# Patient Record
Sex: Female | Born: 1975
Health system: Southern US, Community
[De-identification: ages and names within clinical notes are randomized; demographics above are authoritative.]

## PROBLEM LIST (undated history)

## (undated) DIAGNOSIS — G4730 Sleep apnea, unspecified: Secondary | ICD-10-CM

## (undated) DIAGNOSIS — F329 Major depressive disorder, single episode, unspecified: Secondary | ICD-10-CM

## (undated) DIAGNOSIS — F32A Depression, unspecified: Secondary | ICD-10-CM

## (undated) DIAGNOSIS — F419 Anxiety disorder, unspecified: Secondary | ICD-10-CM

## (undated) DIAGNOSIS — A6000 Herpesviral infection of urogenital system, unspecified: Secondary | ICD-10-CM

## (undated) HISTORY — DX: Herpesviral infection of urogenital system, unspecified: A60.00

## (undated) HISTORY — PX: LAPAROSCOPIC GASTRIC SLEEVE RESECTION: SHX5895

## (undated) HISTORY — PX: TONSILLECTOMY: SUR1361

## (undated) HISTORY — DX: Sleep apnea, unspecified: G47.30

## (undated) HISTORY — DX: Depression, unspecified: F32.A

## (undated) HISTORY — DX: Anxiety disorder, unspecified: F41.9

## (undated) HISTORY — DX: Major depressive disorder, single episode, unspecified: F32.9

---

## 2005-09-21 ENCOUNTER — Ambulatory Visit: Payer: Self-pay | Admitting: Family Medicine

## 2005-09-25 ENCOUNTER — Ambulatory Visit: Payer: Self-pay | Admitting: Family Medicine

## 2005-11-16 ENCOUNTER — Ambulatory Visit: Payer: Self-pay | Admitting: Family Medicine

## 2006-01-22 ENCOUNTER — Ambulatory Visit: Payer: Self-pay | Admitting: Family Medicine

## 2006-02-05 ENCOUNTER — Ambulatory Visit: Payer: Self-pay | Admitting: Family Medicine

## 2006-03-09 ENCOUNTER — Encounter (INDEPENDENT_AMBULATORY_CARE_PROVIDER_SITE_OTHER): Payer: Self-pay | Admitting: Internal Medicine

## 2006-03-09 ENCOUNTER — Other Ambulatory Visit: Admission: RE | Admit: 2006-03-09 | Discharge: 2006-03-09 | Payer: Self-pay | Admitting: Family Medicine

## 2006-03-09 ENCOUNTER — Ambulatory Visit: Payer: Self-pay | Admitting: Family Medicine

## 2006-03-09 LAB — CONVERTED CEMR LAB
Alkaline Phosphatase: 144 units/L — ABNORMAL HIGH (ref 39–117)
Basophils Absolute: 0.1 10*3/uL (ref 0.0–0.1)
CO2: 23 meq/L (ref 19–32)
Calcium: 8.9 mg/dL (ref 8.4–10.5)
Eosinophils Relative: 5 % (ref 0–5)
HCT: 45.4 % (ref 36.0–46.0)
Indirect Bilirubin: 0.3 mg/dL (ref 0.0–0.9)
Lymphocytes Relative: 24 % (ref 12–46)
Neutro Abs: 7.2 10*3/uL (ref 1.7–7.7)
Neutrophils Relative %: 64 % (ref 43–77)
Platelets: 327 10*3/uL (ref 150–400)
Potassium: 4.4 meq/L (ref 3.5–5.3)
RDW: 13.6 % (ref 11.5–14.0)
Sodium: 138 meq/L (ref 135–145)
Total Protein: 6.6 g/dL (ref 6.0–8.3)

## 2006-07-20 ENCOUNTER — Ambulatory Visit: Payer: Self-pay | Admitting: Family Medicine

## 2006-07-20 LAB — CONVERTED CEMR LAB
Bilirubin Urine: NEGATIVE
Glucose, Urine, Semiquant: NEGATIVE
Protein, U semiquant: NEGATIVE

## 2007-02-25 ENCOUNTER — Ambulatory Visit: Payer: Self-pay | Admitting: Family Medicine

## 2007-02-25 DIAGNOSIS — J069 Acute upper respiratory infection, unspecified: Secondary | ICD-10-CM | POA: Insufficient documentation

## 2007-02-25 LAB — CONVERTED CEMR LAB: Rapid Strep: NEGATIVE

## 2007-02-26 ENCOUNTER — Telehealth (INDEPENDENT_AMBULATORY_CARE_PROVIDER_SITE_OTHER): Payer: Self-pay | Admitting: Internal Medicine

## 2007-03-27 ENCOUNTER — Encounter (INDEPENDENT_AMBULATORY_CARE_PROVIDER_SITE_OTHER): Payer: Self-pay | Admitting: Internal Medicine

## 2007-03-27 ENCOUNTER — Ambulatory Visit: Payer: Self-pay | Admitting: Family Medicine

## 2007-03-27 ENCOUNTER — Other Ambulatory Visit: Admission: RE | Admit: 2007-03-27 | Discharge: 2007-03-27 | Payer: Self-pay | Admitting: Family Medicine

## 2007-03-27 DIAGNOSIS — R0989 Other specified symptoms and signs involving the circulatory and respiratory systems: Secondary | ICD-10-CM

## 2007-03-27 DIAGNOSIS — R0609 Other forms of dyspnea: Secondary | ICD-10-CM | POA: Insufficient documentation

## 2007-03-28 ENCOUNTER — Encounter (INDEPENDENT_AMBULATORY_CARE_PROVIDER_SITE_OTHER): Payer: Self-pay | Admitting: Internal Medicine

## 2007-04-01 LAB — CONVERTED CEMR LAB
BUN: 10 mg/dL (ref 6–23)
Basophils Relative: 1.1 % — ABNORMAL HIGH (ref 0.0–1.0)
CO2: 30 meq/L (ref 19–32)
Calcium: 9.1 mg/dL (ref 8.4–10.5)
Creatinine, Ser: 0.6 mg/dL (ref 0.4–1.2)
GFR calc Af Amer: 151 mL/min
Monocytes Relative: 2.4 % — ABNORMAL LOW (ref 3.0–11.0)
Platelets: 373 10*3/uL (ref 150–400)
RDW: 13.1 % (ref 11.5–14.6)

## 2007-04-08 ENCOUNTER — Encounter (INDEPENDENT_AMBULATORY_CARE_PROVIDER_SITE_OTHER): Payer: Self-pay | Admitting: Internal Medicine

## 2007-04-08 ENCOUNTER — Encounter (INDEPENDENT_AMBULATORY_CARE_PROVIDER_SITE_OTHER): Payer: Self-pay | Admitting: *Deleted

## 2007-06-04 ENCOUNTER — Telehealth (INDEPENDENT_AMBULATORY_CARE_PROVIDER_SITE_OTHER): Payer: Self-pay | Admitting: Internal Medicine

## 2007-06-24 ENCOUNTER — Telehealth (INDEPENDENT_AMBULATORY_CARE_PROVIDER_SITE_OTHER): Payer: Self-pay | Admitting: Internal Medicine

## 2007-07-08 ENCOUNTER — Telehealth (INDEPENDENT_AMBULATORY_CARE_PROVIDER_SITE_OTHER): Payer: Self-pay | Admitting: Internal Medicine

## 2007-09-11 ENCOUNTER — Telehealth (INDEPENDENT_AMBULATORY_CARE_PROVIDER_SITE_OTHER): Payer: Self-pay | Admitting: Internal Medicine

## 2007-10-21 ENCOUNTER — Telehealth: Payer: Self-pay | Admitting: Family Medicine

## 2007-10-21 ENCOUNTER — Telehealth (INDEPENDENT_AMBULATORY_CARE_PROVIDER_SITE_OTHER): Payer: Self-pay | Admitting: Internal Medicine

## 2007-10-31 ENCOUNTER — Ambulatory Visit: Payer: Self-pay | Admitting: Family Medicine

## 2007-10-31 DIAGNOSIS — F411 Generalized anxiety disorder: Secondary | ICD-10-CM | POA: Insufficient documentation

## 2008-01-17 ENCOUNTER — Telehealth (INDEPENDENT_AMBULATORY_CARE_PROVIDER_SITE_OTHER): Payer: Self-pay | Admitting: Internal Medicine

## 2008-02-04 ENCOUNTER — Ambulatory Visit: Payer: Self-pay | Admitting: Family Medicine

## 2008-02-04 DIAGNOSIS — B079 Viral wart, unspecified: Secondary | ICD-10-CM | POA: Insufficient documentation

## 2008-02-04 LAB — CONVERTED CEMR LAB: Rapid Strep: NEGATIVE

## 2008-03-10 ENCOUNTER — Telehealth (INDEPENDENT_AMBULATORY_CARE_PROVIDER_SITE_OTHER): Payer: Self-pay | Admitting: Internal Medicine

## 2008-03-10 ENCOUNTER — Ambulatory Visit: Payer: Self-pay | Admitting: Family Medicine

## 2008-03-10 DIAGNOSIS — F3289 Other specified depressive episodes: Secondary | ICD-10-CM | POA: Insufficient documentation

## 2008-03-10 DIAGNOSIS — F329 Major depressive disorder, single episode, unspecified: Secondary | ICD-10-CM | POA: Insufficient documentation

## 2008-04-14 ENCOUNTER — Other Ambulatory Visit: Admission: RE | Admit: 2008-04-14 | Discharge: 2008-04-14 | Payer: Self-pay | Admitting: Family Medicine

## 2008-04-14 ENCOUNTER — Encounter (INDEPENDENT_AMBULATORY_CARE_PROVIDER_SITE_OTHER): Payer: Self-pay | Admitting: Internal Medicine

## 2008-04-14 ENCOUNTER — Ambulatory Visit: Payer: Self-pay | Admitting: Family Medicine

## 2008-04-14 DIAGNOSIS — E669 Obesity, unspecified: Secondary | ICD-10-CM | POA: Insufficient documentation

## 2008-04-16 ENCOUNTER — Telehealth (INDEPENDENT_AMBULATORY_CARE_PROVIDER_SITE_OTHER): Payer: Self-pay | Admitting: Internal Medicine

## 2008-04-17 ENCOUNTER — Encounter (INDEPENDENT_AMBULATORY_CARE_PROVIDER_SITE_OTHER): Payer: Self-pay | Admitting: *Deleted

## 2008-04-28 ENCOUNTER — Ambulatory Visit: Payer: Self-pay | Admitting: Family Medicine

## 2008-04-29 LAB — CONVERTED CEMR LAB
AST: 32 units/L (ref 0–37)
Albumin: 3.4 g/dL — ABNORMAL LOW (ref 3.5–5.2)
BUN: 7 mg/dL (ref 6–23)
Basophils Absolute: 0.1 10*3/uL (ref 0.0–0.1)
CO2: 29 meq/L (ref 19–32)
Cholesterol: 187 mg/dL (ref 0–200)
Eosinophils Absolute: 0.4 10*3/uL (ref 0.0–0.7)
GFR calc non Af Amer: 123.1 mL/min (ref >60)
Glucose, Bld: 94 mg/dL (ref 70–99)
HDL: 31.7 mg/dL — ABNORMAL LOW (ref >39.00)
Hemoglobin: 13.1 g/dL (ref 12.0–15.0)
Lymphocytes Relative: 25.2 % (ref 12.0–46.0)
Monocytes Relative: 5.5 % (ref 3.0–12.0)
Neutro Abs: 4.5 10*3/uL (ref 1.4–7.7)
Neutrophils Relative %: 63.1 % (ref 43.0–77.0)
Platelets: 294 10*3/uL (ref 150.0–400.0)
Potassium: 3.8 meq/L (ref 3.5–5.1)
RDW: 13 % (ref 11.5–14.6)
TSH: 2.1 microintl units/mL (ref 0.35–5.50)
Total Protein: 6.2 g/dL (ref 6.0–8.3)
VLDL: 28.8 mg/dL (ref 0.0–40.0)

## 2008-06-11 ENCOUNTER — Ambulatory Visit: Payer: Self-pay | Admitting: Family Medicine

## 2008-06-12 ENCOUNTER — Telehealth (INDEPENDENT_AMBULATORY_CARE_PROVIDER_SITE_OTHER): Payer: Self-pay | Admitting: Internal Medicine

## 2008-06-29 ENCOUNTER — Telehealth (INDEPENDENT_AMBULATORY_CARE_PROVIDER_SITE_OTHER): Payer: Self-pay | Admitting: Internal Medicine

## 2008-09-21 ENCOUNTER — Telehealth (INDEPENDENT_AMBULATORY_CARE_PROVIDER_SITE_OTHER): Payer: Self-pay | Admitting: Internal Medicine

## 2008-10-22 ENCOUNTER — Telehealth (INDEPENDENT_AMBULATORY_CARE_PROVIDER_SITE_OTHER): Payer: Self-pay | Admitting: Internal Medicine

## 2008-11-02 ENCOUNTER — Telehealth: Payer: Self-pay | Admitting: Family Medicine

## 2008-11-02 DIAGNOSIS — B009 Herpesviral infection, unspecified: Secondary | ICD-10-CM | POA: Insufficient documentation

## 2008-11-13 ENCOUNTER — Ambulatory Visit: Payer: Self-pay | Admitting: Specialist

## 2009-04-16 ENCOUNTER — Telehealth: Payer: Self-pay | Admitting: Family Medicine

## 2009-06-29 ENCOUNTER — Emergency Department: Payer: Self-pay | Admitting: Emergency Medicine

## 2009-12-09 ENCOUNTER — Telehealth: Payer: Self-pay | Admitting: Family Medicine

## 2010-02-08 NOTE — Progress Notes (Signed)
Summary: refill request for valtrex  Phone Note Refill Request Message from:  Fax from Pharmacy  Refills Requested: Medication #1:  VALTREX 1 GM  TABS 2 tabs two times a day as directed as needed   Last Refilled: 07/11/1976 Faxed request from [facility] [address] Erin Mcdonald.  Initial call taken by: Lowella Petties CMA, AAMA,  December 09, 2009 9:56 AM  Follow-up for Phone Call        patient needs OV with MD here.  Last seen >1 year ago.  30 min appointment.  Follow-up by: Crawford Givens MD,  December 09, 2009 1:32 PM  Additional Follow-up for Phone Call Additional follow up Details #1::        Patient Advised.   She states she will make an appointment after the holidays. Additional Follow-up by: Delilah Shan CMA Adam Demary Dull),  December 09, 2009 3:25 PM    Prescriptions: VALTREX 1 GM  TABS (VALACYCLOVIR HCL) 2 tabs two times a day as directed as needed  #60 x 0   Entered and Authorized by:   Crawford Givens MD   Signed by:   Crawford Givens MD on 12/09/2009   Method used:   Electronically to        [facility]. [phone]* (retail)       [address]       [place], [place]  [address]       Ph: [phone]       Fax: [phone]   RxID:   [id number]

## 2010-02-08 NOTE — Progress Notes (Signed)
Summary: Acyclovir  Phone Note Refill Request Message from:  [employer] on April 16, 2009 12:06 PM  Refills Requested: Medication #1:  VALTREX 1 GM  TABS 2 tabs two times a day as directed as needed [facility] #[id number]*   Last Fill Date:  11/02/2008   Pharmacy Phone:  [phone]   Method Requested: Electronic Initial call taken by: Delilah Shan CMA (AAMA),  April 16, 2009 12:06 PM    Prescriptions: VALTREX 1 GM  TABS (VALACYCLOVIR HCL) 2 tabs two times a day as directed as needed  #60 x 0   Entered and Authorized by:   Ruthe Mannan MD   Signed by:   Ruthe Mannan MD on 04/16/2009   Method used:   Electronically to        [facility]. [phone]* (retail)       [address]       [place], [place]  [address]       Ph: [phone]       Fax: [phone]   RxID:   [id number]

## 2010-05-27 NOTE — Assessment & Plan Note (Signed)
[facility]                             [facility] OFFICE NOTE   NAME:Erin Mcdonald, Erin Mcdonald                            MRN:          [id number]  DATE:09/21/2005                            DOB:          Jul 10, 1975    CHIEF COMPLAINT:  A 35 year old white female here to establish new doctor.   HISTORY OF PRESENT ILLNESS:  Erin Mcdonald states that she used to see Dr. Quillian Quince  and is now transferring her care over to me.  She comes with the following  concerns:  1. Genital herpes, moderate control:  She has been switches over to      acyclovir 400 mg twice daily.  She states that she now has gotten back      on her insurance and would like to switch back to Valtrex 1000 mg      daily.  She states that this works much better for her than the      acyclovir and she is much better at remembering to take it once a day.  2. Panic attack, moderate control:  [name] states that she has been      having problems with panic attacks for the past few years.  She was      prescribed Xanax 0.25 mg to use as needed for panic attacks.  She      states that she only has about 1 every 2 months or so.  The panic      symptoms are intermittent for approximately 1 week and then go away.      She notes an increase in the panic attack feelings when she is under      more stress.  She feels anxious, sweaty, and feeling of doom when the      happen.  She has fear  that they will return.  She is currently seeing      a therapist, [name], for this issue.  She is requesting Xanax      refill at this time.  She has tried different antidepressants in the      past including Zoloft and possibly Effexor.  She states she did not      like the way they made her feel.  She is open to trying others in the      future.  3. Contraceptive counseling:  She states that she was previously on      Seasonale for several years and did well on this.  She more recently      tried Loestrin 24 for  about 3 weeks and felt as though she had an      increase in abdominal cramps and heavier periods.  She then stopped      this medication.  She would like to return to Seasonale.  4. Tobacco abuse:  She is currently contemplative about tobacco cessation.      She has heard about Chantix from one of her friends.  She is      considering trying this medication, but would like to talk about what  her friend experienced before she does.  She will let me know if she is      interested.  5. Obesity:  She has been gaining weight steadily over the past few years.      She has difficulty finding time for exercise.  She very often eats on      the run and has fast food for lunch.   PAST MEDICAL HISTORY:  1. Anxiety and panic attacks.  2. Genital herpes.  3. Tobacco abuse.  4. Hypercholesterolemia.   HOSPITALIZATIONS/SURGERIES/PROCEDURES:  1. Pap smear in January 2007.  2. Tetanus in 2005.   ALLERGIES:  None.   MEDICATIONS:  1. Acyclovir 400 mg one p.o. b.i.d.  2. Xanax 0.25 mg p.r.n. panic attacks.   FAMILY HISTORY:  Father alive at age 48, healthy, except that he is  undergoing some sort of intestinal surgery that she is unsure as to why.  She does not think that it is cancer-related.  Mother alive at age 28, had  an MI at age 33; she also has hypertension.  Nashika has 1 brother who has high  cholesterol and 1 half sister who is healthy.  There is no family history of  breast cancer, ovarian cancer, uterine cancer or colon cancer.  There is  some type of blood vessel cancer in her grandmother.   SOCIAL HISTORY:  She is a Scientist, forensic at [employer].  She is single and  is sexual activity.  She recently got out of a 4-year relationship.  She  uses condoms with intercourse.  She does not have any children.  She does  not get any regular exercise.  As far as her diet, she eats some fast foods  and does not get lots of water; she drinks a lot of soda though.  She smokes  about 15  cigarettes per day.  She uses a moderate amount of alcohol, mainly  in weekend binges with about 12 beers over the entire weekend.  No other  drug use.   PHYSICAL EXAM:  VITAL SIGNS:  Height 69 inches, weight 249, making BMI about  36.  Blood pressure 106/68, pulse 64, temperature 97.8.  GENERAL:  Obese-appearing female in no apparent distress.  HEENT:  Thick neck.  Small oropharyngeal cavity.  Nares clear.  Tympanic  membranes clear.  No thyromegaly.  No lymphadenopathy, cervical or  supraclavicular.  CARDIOVASCULAR:  Regular rate and rhythm.  No murmurs, rubs, or gallops.  PULMONARY:  Clear to auscultation bilaterally.  No wheezes, rales or  rhonchi.  ABDOMEN:  Soft, obese and nontender.  Normoactive bowel sounds.  No  hepatosplenomegaly.  MUSCULOSKELETAL:  Strength 5/5 in upper and lower extremities.  NEUROLOGIC:  Alert and oriented x3.  Cranial nerves II-XII grossly intact.  Reflexes 2+.  EXTREMITIES:  No peripheral edema, 2+ peripheral pulses, gait within normal  limits.   ASSESSMENT AND PLAN:  1. Genital herpes:  Will stop acyclovir and restart Valtrex.  She was      given a prescription for 1000 mg p.o. daily with 6 refills.  2. Panic attacks:  We discussed that Xanax is a temporary medication.  She      needs to prevent the panic attacks before they come.  I congratulated      her on her work with her therapist, but also encouraged her that we      will need to try a different type of antianxiety medication such as      BuSpar or an  selective serotonin reuptake inhibitor.  I did give her a      temporary prescription of Xanax 0.25 mg, #30, zero refills.  3. Contraceptive counseling:  We discussed different options and she chose      Seasonale.  She was given a prescription for this today.  4. Obesity:  I encouraged her to change soda over to water.  She was given      information on how to eat on the run healthfully, as well as other     nutritional information.  She also  needs to initiate an exercise      program and  was encouraged to do so.  5. Prevention:  Given her family history of coronary artery disease, we      will definitely need to follow up on her cholesterol panel.  She did      state that this was done in January 2007 and was slightly elevated.  I      will obtain records from her previous doctor.  We did discuss her      alcohol use and she was encouraged to try to decrease it.  We also      discussed tobacco      cessation and she will let me know if she would like to try a      medication to decrease cravings.                                   Kerby Nora, MD   AB/MedQ  DD:  09/21/2005  DT:  09/22/2005  Job #:  [id number]

## 2011-01-10 HISTORY — PX: TONSILLECTOMY: SUR1361

## 2011-03-01 ENCOUNTER — Ambulatory Visit: Payer: Self-pay | Admitting: Internal Medicine

## 2011-12-06 ENCOUNTER — Ambulatory Visit: Payer: Self-pay | Admitting: Otolaryngology

## 2011-12-08 LAB — PATHOLOGY REPORT

## 2012-08-15 DIAGNOSIS — Z79899 Other long term (current) drug therapy: Secondary | ICD-10-CM | POA: Insufficient documentation

## 2012-11-15 HISTORY — PX: LAPAROSCOPIC GASTRIC SLEEVE RESECTION: SHX5895

## 2013-02-28 ENCOUNTER — Emergency Department: Payer: Self-pay | Admitting: Emergency Medicine

## 2013-07-21 ENCOUNTER — Ambulatory Visit: Payer: Self-pay

## 2014-04-28 NOTE — Op Note (Signed)
PATIENT NAMESARANDA, Erin Mcdonald MR#:  [id number] DATE OF BIRTH:  1975/03/08  DATE OF PROCEDURE:  12/06/2011  PREOPERATIVE DIAGNOSES:  1. Chronic adenotonsillitis with adenotonsillar hyperplasia.   2. History of obstructive sleep apnea.   POSTOPERATIVE DIAGNOSES:  1. Chronic adenotonsillitis with adenotonsillar hyperplasia.   2. History of obstructive sleep apnea.   PROCEDURE: Adenotonsillectomy.   SURGEON: Osie Cheeks, MD   ANESTHESIA: General endotracheal.   INDICATIONS: The patient has a history of recurrent tonsillitis as well as a history of obstructive sleep apnea.   FINDINGS: Tonsils were 3+ in size. The adenoids were moderately large partially obstructing the nasopharynx.   COMPLICATIONS: None.   DESCRIPTION OF PROCEDURE: After obtaining informed consent, the patient was taken to the operating room and placed in the supine position. After induction of general endotracheal anesthesia, the patient was turned 90 degrees and the head draped with the eyes protected. A McIVOR retractor was used to open the mouth and a red rubber catheter to retract the palate. The palate was palpated and there was no evidence of submucous cleft. The adenoids were resected in the usual fashion with the adenotome. Bleeding was controlled with Afrin moistened packs followed by cauterization of the adenoid bed. The right tonsil was grasped with an Allis and resected from the tonsillar fossa in the usual fashion with Bovie. The left tonsil was resected in a similar fashion. Cautery was used to control minor bleeding. Each tonsillar fossa was then injected with 0.25% Marcaine with epinephrine 1:200,000. The nose and throat were irrigated and suctioned to remove any adenoid debris and blood clot. She was then returned to the anesthesiologist for awakening. She was awakened and taken to the recovery room in good condition postoperatively. Blood loss was approximately 75 mL.  ____________________________ Sammuel Hines.  Richardson Landry, MD psb:drc D: 12/06/2011 08:25:34 ET T: 12/06/2011 09:56:32 ET JOB#: [id number]  cc: Sammuel Hines. Richardson Landry, MD, <Dictator> Riley Nearing MD ELECTRONICALLY SIGNED 12/13/2011 8:55

## 2014-08-27 DIAGNOSIS — D509 Iron deficiency anemia, unspecified: Secondary | ICD-10-CM | POA: Insufficient documentation

## 2015-01-07 ENCOUNTER — Other Ambulatory Visit: Payer: Self-pay | Admitting: Unknown Physician Specialty

## 2015-01-07 DIAGNOSIS — N63 Unspecified lump in unspecified breast: Secondary | ICD-10-CM

## 2015-01-19 ENCOUNTER — Other Ambulatory Visit: Payer: Self-pay

## 2015-01-19 ENCOUNTER — Ambulatory Visit: Payer: Self-pay

## 2015-04-21 DIAGNOSIS — F17210 Nicotine dependence, cigarettes, uncomplicated: Secondary | ICD-10-CM | POA: Diagnosis not present

## 2015-04-21 DIAGNOSIS — J019 Acute sinusitis, unspecified: Secondary | ICD-10-CM | POA: Diagnosis not present

## 2015-04-21 DIAGNOSIS — B373 Candidiasis of vulva and vagina: Secondary | ICD-10-CM | POA: Diagnosis not present

## 2015-04-21 DIAGNOSIS — J300 Vasomotor rhinitis: Secondary | ICD-10-CM | POA: Diagnosis not present

## 2015-05-12 DIAGNOSIS — M25562 Pain in left knee: Secondary | ICD-10-CM | POA: Diagnosis not present

## 2015-05-26 DIAGNOSIS — B9689 Other specified bacterial agents as the cause of diseases classified elsewhere: Secondary | ICD-10-CM | POA: Diagnosis not present

## 2015-05-26 DIAGNOSIS — N760 Acute vaginitis: Secondary | ICD-10-CM | POA: Diagnosis not present

## 2015-05-26 DIAGNOSIS — R875 Abnormal microbiological findings in specimens from female genital organs: Secondary | ICD-10-CM | POA: Diagnosis not present

## 2015-05-26 DIAGNOSIS — D649 Anemia, unspecified: Secondary | ICD-10-CM | POA: Diagnosis not present

## 2015-05-26 DIAGNOSIS — Z124 Encounter for screening for malignant neoplasm of cervix: Secondary | ICD-10-CM | POA: Diagnosis not present

## 2015-05-26 DIAGNOSIS — Z113 Encounter for screening for infections with a predominantly sexual mode of transmission: Secondary | ICD-10-CM | POA: Diagnosis not present

## 2015-05-26 DIAGNOSIS — Z01419 Encounter for gynecological examination (general) (routine) without abnormal findings: Secondary | ICD-10-CM | POA: Diagnosis not present

## 2015-05-26 DIAGNOSIS — Z1151 Encounter for screening for human papillomavirus (HPV): Secondary | ICD-10-CM | POA: Diagnosis not present

## 2015-06-16 DIAGNOSIS — F17210 Nicotine dependence, cigarettes, uncomplicated: Secondary | ICD-10-CM | POA: Diagnosis not present

## 2015-06-16 DIAGNOSIS — N390 Urinary tract infection, site not specified: Secondary | ICD-10-CM | POA: Diagnosis not present

## 2015-06-16 DIAGNOSIS — B009 Herpesviral infection, unspecified: Secondary | ICD-10-CM | POA: Diagnosis not present

## 2015-06-16 DIAGNOSIS — B373 Candidiasis of vulva and vagina: Secondary | ICD-10-CM | POA: Diagnosis not present

## 2015-06-16 DIAGNOSIS — D510 Vitamin B12 deficiency anemia due to intrinsic factor deficiency: Secondary | ICD-10-CM | POA: Diagnosis not present

## 2015-11-11 DIAGNOSIS — N390 Urinary tract infection, site not specified: Secondary | ICD-10-CM | POA: Diagnosis not present

## 2015-11-19 DIAGNOSIS — D509 Iron deficiency anemia, unspecified: Secondary | ICD-10-CM | POA: Diagnosis not present

## 2015-11-19 DIAGNOSIS — Z713 Dietary counseling and surveillance: Secondary | ICD-10-CM | POA: Diagnosis not present

## 2015-11-19 DIAGNOSIS — F17210 Nicotine dependence, cigarettes, uncomplicated: Secondary | ICD-10-CM | POA: Diagnosis not present

## 2015-11-19 DIAGNOSIS — Z8669 Personal history of other diseases of the nervous system and sense organs: Secondary | ICD-10-CM | POA: Diagnosis not present

## 2015-11-19 DIAGNOSIS — F419 Anxiety disorder, unspecified: Secondary | ICD-10-CM | POA: Diagnosis not present

## 2015-11-19 DIAGNOSIS — Z9884 Bariatric surgery status: Secondary | ICD-10-CM | POA: Diagnosis not present

## 2015-12-01 DIAGNOSIS — K219 Gastro-esophageal reflux disease without esophagitis: Secondary | ICD-10-CM | POA: Diagnosis not present

## 2015-12-01 DIAGNOSIS — F411 Generalized anxiety disorder: Secondary | ICD-10-CM | POA: Diagnosis not present

## 2015-12-01 DIAGNOSIS — Z7689 Persons encountering health services in other specified circumstances: Secondary | ICD-10-CM | POA: Diagnosis not present

## 2016-02-04 DIAGNOSIS — F410 Panic disorder [episodic paroxysmal anxiety] without agoraphobia: Secondary | ICD-10-CM | POA: Diagnosis not present

## 2016-02-04 DIAGNOSIS — F411 Generalized anxiety disorder: Secondary | ICD-10-CM | POA: Diagnosis not present

## 2016-02-17 DIAGNOSIS — F329 Major depressive disorder, single episode, unspecified: Secondary | ICD-10-CM | POA: Diagnosis not present

## 2016-02-17 DIAGNOSIS — F419 Anxiety disorder, unspecified: Secondary | ICD-10-CM | POA: Diagnosis not present

## 2016-02-17 DIAGNOSIS — Z6841 Body Mass Index (BMI) 40.0 and over, adult: Secondary | ICD-10-CM | POA: Diagnosis not present

## 2016-03-03 DIAGNOSIS — F331 Major depressive disorder, recurrent, moderate: Secondary | ICD-10-CM | POA: Diagnosis not present

## 2016-03-03 DIAGNOSIS — K219 Gastro-esophageal reflux disease without esophagitis: Secondary | ICD-10-CM | POA: Diagnosis not present

## 2016-03-03 DIAGNOSIS — E669 Obesity, unspecified: Secondary | ICD-10-CM | POA: Diagnosis not present

## 2016-03-03 DIAGNOSIS — F411 Generalized anxiety disorder: Secondary | ICD-10-CM | POA: Diagnosis not present

## 2016-03-03 DIAGNOSIS — F17210 Nicotine dependence, cigarettes, uncomplicated: Secondary | ICD-10-CM | POA: Diagnosis not present

## 2016-03-17 ENCOUNTER — Other Ambulatory Visit: Payer: Self-pay | Admitting: Internal Medicine

## 2016-04-05 DIAGNOSIS — J300 Vasomotor rhinitis: Secondary | ICD-10-CM | POA: Diagnosis not present

## 2016-04-05 DIAGNOSIS — F411 Generalized anxiety disorder: Secondary | ICD-10-CM | POA: Diagnosis not present

## 2016-04-05 DIAGNOSIS — J019 Acute sinusitis, unspecified: Secondary | ICD-10-CM | POA: Diagnosis not present

## 2016-04-05 DIAGNOSIS — B373 Candidiasis of vulva and vagina: Secondary | ICD-10-CM | POA: Diagnosis not present

## 2016-04-06 ENCOUNTER — Encounter (INDEPENDENT_AMBULATORY_CARE_PROVIDER_SITE_OTHER): Payer: Self-pay

## 2016-04-06 ENCOUNTER — Ambulatory Visit (INDEPENDENT_AMBULATORY_CARE_PROVIDER_SITE_OTHER): Payer: BLUE CROSS/BLUE SHIELD | Admitting: Vascular Surgery

## 2016-04-06 ENCOUNTER — Encounter (INDEPENDENT_AMBULATORY_CARE_PROVIDER_SITE_OTHER): Payer: Self-pay | Admitting: Vascular Surgery

## 2016-04-06 VITALS — BP 127/71 | HR 75 | Resp 18 | Ht 71.0 in | Wt 282.0 lb

## 2016-04-06 DIAGNOSIS — R600 Localized edema: Secondary | ICD-10-CM

## 2016-04-06 DIAGNOSIS — I83813 Varicose veins of bilateral lower extremities with pain: Secondary | ICD-10-CM | POA: Diagnosis not present

## 2016-04-12 DIAGNOSIS — R600 Localized edema: Secondary | ICD-10-CM | POA: Insufficient documentation

## 2016-04-12 DIAGNOSIS — I83813 Varicose veins of bilateral lower extremities with pain: Secondary | ICD-10-CM | POA: Insufficient documentation

## 2016-04-12 NOTE — Progress Notes (Signed)
vas 

## 2016-04-12 NOTE — Progress Notes (Signed)
Subjective:    Patient ID: Erin Mcdonald, female    DOB: Jul 30, 1975, 41 y.o.   MRN: [id number] Chief Complaint  Patient presents with  . New Patient (Initial Visit)    leg pain   Presents as a new patient self referred for "right lower extremity painful varicose veins". The patient was seen "years ago" by Dr. Delana Meyer. The patient endorses a history of worsening right lower extremity pain along the prominent varicose veins located in her right lower extremity. The discomfort has progressed over the last few years. Pain is worse at night. The pain is also associated with lower extremity edema. The patient continues to wear medical grade one compression stockings on a daily basis, she engages in elevating her legs and remaining active with minimal relief in her right lower extremity discomfort. The patient has been taking OTC inflammatories with minimal relief in symptoms. She denies any trauma or surgery to her right lower extremity. The patient denies any DVT history. She denies any fever, nausea or vomiting.    Review of Systems  Constitutional: Negative.   HENT: Negative.   Eyes: Negative.   Respiratory: Negative.   Cardiovascular: Positive for leg swelling.       Right Lower Extremity Painful Varicose Veins  Gastrointestinal: Negative.   Endocrine: Negative.   Genitourinary: Negative.   Musculoskeletal: Negative.   Skin: Negative.   Allergic/Immunologic: Negative.   Neurological: Negative.   Hematological: Negative.   Psychiatric/Behavioral: Negative.       Objective:   Physical Exam  Constitutional: She is oriented to person, place, and time. She appears well-developed and well-nourished. No distress.  HENT:  Head: Normocephalic and atraumatic.  Eyes: Conjunctivae are normal. Pupils are equal, round, and reactive to light.  Neck: Normal range of motion.  Cardiovascular: Normal rate, regular rhythm, normal heart sounds and intact distal pulses.   Pulses:      Radial pulses are 2+  on the right side, and 2+ on the left side.       Dorsalis pedis pulses are 2+ on the right side, and 2+ on the left side.       Posterior tibial pulses are 2+ on the right side, and 2+ on the left side.  >1cm scattered varicose veins on right lower extremity. Moderate edema. No stasis dermatitis.  Pulmonary/Chest: Effort normal.  Musculoskeletal: Normal range of motion. She exhibits edema.  Neurological: She is alert and oriented to person, place, and time.  Skin: Skin is warm and dry. She is not diaphoretic.  Psychiatric: She has a normal mood and affect. Her behavior is normal. Judgment and thought content normal.  Vitals reviewed.  BP 127/71   Pulse 75   Resp 18   Ht 5\' 11"  (1.803 m)   Wt 282 lb (127.9 kg)   BMI 39.33 kg/m   History reviewed. No pertinent past medical history.  Social History   Social History  . Marital status: Single    Spouse name: N/A  . Number of children: N/A  . Years of education: N/A   Occupational History  . Not on file.   Social History Main Topics  . Smoking status: Current Every Day Smoker  . Smokeless tobacco: Never Used  . Alcohol use Yes     Comment: occasionally  . Drug use: No  . Sexual activity: Not on file   Other Topics Concern  . Not on file   Social History Narrative  . No narrative on file   Past Surgical  History:  Procedure Laterality Date  . TONSILLECTOMY     Family History  Problem Relation Age of Onset  . Heart attack Mother   . Diabetes Paternal Grandmother   . Diabetes Paternal Grandfather    Allergies  Allergen Reactions  . Codeine Other (See Comments)    Cause pt to be hyper      Assessment & Plan:  Presents as a new patient self referred for "right lower extremity painful varicose veins". The patient was seen "years ago" by Dr. Delana Meyer. The patient endorses a history of worsening right lower extremity pain along the prominent varicose veins located in her right lower extremity. The discomfort has  progressed over the last few years. Pain is worse at night. The pain is also associated with lower extremity edema. The patient continues to wear medical grade one compression stockings on a daily basis, she engages in elevating her legs and remaining active with minimal relief in her right lower extremity discomfort. The patient has been taking OTC inflammatories with minimal relief in symptoms. She denies any trauma or surgery to her right lower extremity. Thd patient denies any DVT history. She denies any fever, nausea or vomiting.   1. Varicose veins of bilateral lower extremities with pain - New Patient seems to be failing conservative therapy. Will bring patient back for right lower extremity venous reflux to rule out any venous disease contributing to the patients discomfort.  The patient was instructed to call the office in the interim if any worsening edema or ulcerations to the legs, feet or toes occurs. The patient expresses their understanding.  - VAS [place] LOWER EXTREMITY VENOUS REFLUX; Future  2. Bilateral lower extremity edema - New As above.   No current outpatient prescriptions on file prior to visit.   No current facility-administered medications on file prior to visit.     There are no Patient Instructions on file for this visit. No Follow-up on file.   Christyan Reger A Teirra Carapia, PA-C

## 2016-05-22 DIAGNOSIS — Z9884 Bariatric surgery status: Secondary | ICD-10-CM | POA: Diagnosis not present

## 2016-05-22 DIAGNOSIS — B009 Herpesviral infection, unspecified: Secondary | ICD-10-CM | POA: Diagnosis not present

## 2016-05-22 DIAGNOSIS — R351 Nocturia: Secondary | ICD-10-CM | POA: Diagnosis not present

## 2016-05-22 DIAGNOSIS — Z713 Dietary counseling and surveillance: Secondary | ICD-10-CM | POA: Diagnosis not present

## 2016-05-22 DIAGNOSIS — Z638 Other specified problems related to primary support group: Secondary | ICD-10-CM | POA: Diagnosis not present

## 2016-05-22 DIAGNOSIS — F419 Anxiety disorder, unspecified: Secondary | ICD-10-CM | POA: Diagnosis not present

## 2016-05-22 DIAGNOSIS — R7989 Other specified abnormal findings of blood chemistry: Secondary | ICD-10-CM | POA: Diagnosis not present

## 2016-05-22 DIAGNOSIS — R635 Abnormal weight gain: Secondary | ICD-10-CM | POA: Diagnosis not present

## 2016-05-22 DIAGNOSIS — F17210 Nicotine dependence, cigarettes, uncomplicated: Secondary | ICD-10-CM | POA: Diagnosis not present

## 2016-05-22 DIAGNOSIS — K219 Gastro-esophageal reflux disease without esophagitis: Secondary | ICD-10-CM | POA: Diagnosis not present

## 2016-05-22 DIAGNOSIS — G4733 Obstructive sleep apnea (adult) (pediatric): Secondary | ICD-10-CM | POA: Diagnosis not present

## 2016-05-22 DIAGNOSIS — Z6841 Body Mass Index (BMI) 40.0 and over, adult: Secondary | ICD-10-CM | POA: Diagnosis not present

## 2016-05-22 DIAGNOSIS — N926 Irregular menstruation, unspecified: Secondary | ICD-10-CM | POA: Diagnosis not present

## 2016-05-22 DIAGNOSIS — F329 Major depressive disorder, single episode, unspecified: Secondary | ICD-10-CM | POA: Diagnosis not present

## 2016-05-22 DIAGNOSIS — Z566 Other physical and mental strain related to work: Secondary | ICD-10-CM | POA: Diagnosis not present

## 2016-05-22 DIAGNOSIS — D509 Iron deficiency anemia, unspecified: Secondary | ICD-10-CM | POA: Diagnosis not present

## 2016-06-14 ENCOUNTER — Ambulatory Visit (INDEPENDENT_AMBULATORY_CARE_PROVIDER_SITE_OTHER): Payer: BLUE CROSS/BLUE SHIELD | Admitting: Vascular Surgery

## 2016-06-14 ENCOUNTER — Encounter (INDEPENDENT_AMBULATORY_CARE_PROVIDER_SITE_OTHER): Payer: Self-pay | Admitting: Vascular Surgery

## 2016-06-14 ENCOUNTER — Ambulatory Visit (INDEPENDENT_AMBULATORY_CARE_PROVIDER_SITE_OTHER): Payer: BLUE CROSS/BLUE SHIELD

## 2016-06-14 VITALS — BP 99/60 | HR 68 | Resp 17 | Wt 282.0 lb

## 2016-06-14 DIAGNOSIS — I872 Venous insufficiency (chronic) (peripheral): Secondary | ICD-10-CM | POA: Diagnosis not present

## 2016-06-14 DIAGNOSIS — I83813 Varicose veins of bilateral lower extremities with pain: Secondary | ICD-10-CM | POA: Diagnosis not present

## 2016-06-14 NOTE — Progress Notes (Signed)
Subjective:    Patient ID: Erin Mcdonald, female    DOB: 1975-04-05, 41 y.o.   MRN: [id number] Chief Complaint  Patient presents with  . Follow-up   Patient last seen on 04/06/2016. Patient presents to review vascular studies. Since our last visit the patient has been wearing medical grade 1 compression stockings, elevating her legs daily and remaining active. She's been doing this with minimal improvement in her right lower lower extremity symptoms. Today the patient underwent a right lower extremity venous reflux exam which was notable for venous incompetence in the right great and small saphenous veins as well as throughout the right deep venous system. Minimally occlusive chronic thrombus noted in the right knee and proximal/mid calf level small saphenous vein which is consistent with cannulation of a previous ablation site. No evidence of deep vein thrombosis in the right lower extremity. Patient endorses a history of venous treatment to the right lower extremity however she does not remember which kind. Seems like possible laser ablation to the right lower extremity. Denies any fever or nausea or vomiting.    Review of Systems  Constitutional: Negative.   HENT: Negative.   Eyes: Negative.   Respiratory: Negative.   Cardiovascular: Positive for leg swelling.       Right lower extremity pain  Gastrointestinal: Negative.   Endocrine: Negative.   Genitourinary: Negative.   Musculoskeletal: Negative.   Skin: Negative.   Allergic/Immunologic: Negative.   Neurological: Negative.   Hematological: Negative.   Psychiatric/Behavioral: Negative.       Objective:   Physical Exam  Constitutional: She is oriented to person, place, and time. She appears well-developed and well-nourished. No distress.  HENT:  Head: Normocephalic and atraumatic.  Eyes: Conjunctivae are normal. Pupils are equal, round, and reactive to light.  Neck: Normal range of motion.  Cardiovascular: Normal rate, regular  rhythm, normal heart sounds and intact distal pulses.   Pulses:      Radial pulses are 2+ on the right side, and 2+ on the left side.       Dorsalis pedis pulses are 2+ on the right side, and 2+ on the left side.       Posterior tibial pulses are 2+ on the right side, and 2+ on the left side.  Pulmonary/Chest: Effort normal.  Musculoskeletal: Normal range of motion. She exhibits edema (Moderate right lower extremity edema).  Neurological: She is alert and oriented to person, place, and time.  Skin: She is not diaphoretic.  >1 centimeter varicose veins noted along the right lower extremity  Psychiatric: She has a normal mood and affect. Her behavior is normal. Judgment and thought content normal.  Vitals reviewed.   BP 99/60   Pulse 68   Resp 17   Wt 282 lb (127.9 kg)   BMI 39.33 kg/m   No past medical history on file.  Social History   Social History  . Marital status: Single    Spouse name: N/A  . Number of children: N/A  . Years of education: N/A   Occupational History  . Not on file.   Social History Main Topics  . Smoking status: Current Every Day Smoker  . Smokeless tobacco: Never Used  . Alcohol use Yes     Comment: occasionally  . Drug use: No  . Sexual activity: Not on file   Other Topics Concern  . Not on file   Social History Narrative  . No narrative on file    Past Surgical History:  Procedure Laterality Date  . TONSILLECTOMY      Family History  Problem Relation Age of Onset  . Heart attack Mother   . Diabetes Paternal Grandmother   . Diabetes Paternal Grandfather     Allergies  Allergen Reactions  . Codeine Other (See Comments)    Cause pt to be hyper       Assessment & Plan:  Patient last seen on 04/06/2016. Patient presents to review vascular studies. Since our last visit the patient has been wearing medical grade 1 compression stockings, elevating her legs daily and remaining active. She's been doing this with minimal improvement  in her right lower lower extremity symptoms. Today the patient underwent a right lower extremity venous reflux exam which was notable for venous incompetence in the right great and small saphenous veins as well as throughout the right deep venous system. Minimally occlusive chronic thrombus noted in the right knee and proximal/mid calf level small saphenous vein which is consistent with cannulation of a previous ablation site. No evidence of deep vein thrombosis in the right lower extremity. Patient endorses a history of venous treatment to the right lower extremity however she does not remember which kind. Seems like possible laser ablation to the right lower extremity. Denies any fever or nausea or vomiting.   1. Varicose veins of bilateral lower extremities with pain - stable The patient was encouraged to wear graduated compression stockings (20-30 mmHg) on a daily basis. The patient was instructed to begin wearing the stockings first thing in the morning and removing them in the evening. The patient was instructed specifically not to sleep in the stockings. Prescription In addition, behavioral modification including elevation during the day will be initiated. Anti-inflammatories for pain. The patient is likely to benefit from endovenous laser ablation. I have discussed the risks and benefits of the procedure. The risks primarily include DVT, recanalization, bleeding, infection, and inability to gain access. The patient will follow up in 2 months to asses conservative management.  Information on chronic venous insufficiency and compression stockings was given to the patient. The patient was instructed to call the office in the interim if any worsening edema or ulcerations to the legs, feet or toes occurs. The patient expresses their understanding.  2. Chronic venous insufficiency - new As above   Current Outpatient Prescriptions on File Prior to Visit  Medication Sig Dispense Refill  .  ALPRAZolam (XANAX) 1 MG tablet   0  . escitalopram (LEXAPRO) 20 MG tablet Take by mouth.    Marland Kitchen omeprazole (PRILOSEC) 20 MG capsule Take by mouth 2 (two) times daily.  0   No current facility-administered medications on file prior to visit.     There are no Patient Instructions on file for this visit. No Follow-up on file.   Erin Hinckley A Malahki Gasaway, PA-C

## 2016-07-24 ENCOUNTER — Encounter (INDEPENDENT_AMBULATORY_CARE_PROVIDER_SITE_OTHER): Payer: Self-pay | Admitting: Vascular Surgery

## 2016-07-24 ENCOUNTER — Ambulatory Visit (INDEPENDENT_AMBULATORY_CARE_PROVIDER_SITE_OTHER): Payer: BLUE CROSS/BLUE SHIELD | Admitting: Vascular Surgery

## 2016-07-24 VITALS — BP 99/63 | HR 60 | Ht 71.0 in | Wt 287.0 lb

## 2016-07-24 DIAGNOSIS — I872 Venous insufficiency (chronic) (peripheral): Secondary | ICD-10-CM

## 2016-07-24 DIAGNOSIS — R600 Localized edema: Secondary | ICD-10-CM | POA: Diagnosis not present

## 2016-07-24 DIAGNOSIS — I83813 Varicose veins of bilateral lower extremities with pain: Secondary | ICD-10-CM

## 2016-07-24 NOTE — Progress Notes (Signed)
MRN : [id number]  Erin Mcdonald is a 41 y.o. (Apr 20, 1975) female who presents with chief complaint of  Chief Complaint  Patient presents with  . Follow-up    Discuss Laser  .  History of Present Illness: The patient returns for followup evaluation 3 months after the initial visit. The patient continues to have pain in the lower extremities with dependency. The pain is lessened with elevation. Graduated compression stockings, Class I (20-30 mmHg), have been worn but the stockings do not eliminate the leg pain. Over-the-counter analgesics do not improve the symptoms. The degree of discomfort continues to interfere with daily activities. The patient notes the pain in the legs is causing problems with daily exercise, at the workplace and even with household activities and maintenance such as standing in the kitchen preparing meals and doing dishes.   Venous ultrasound shows normal deep venous system, no evidence of acute or chronic DVT.  Superficial reflux is present in the right GSV and SSV Current Meds  Medication Sig  . ALPRAZolam (XANAX) 1 MG tablet   . escitalopram (LEXAPRO) 20 MG tablet Take by mouth.  Marland Kitchen omeprazole (PRILOSEC) 20 MG capsule Take by mouth 2 (two) times daily.    No past medical history on file.  Past Surgical History:  Procedure Laterality Date  . TONSILLECTOMY      Social History Social History  Substance Use Topics  . Smoking status: Current Every Day Smoker  . Smokeless tobacco: Never Used  . Alcohol use Yes     Comment: occasionally    Family History Family History  Problem Relation Age of Onset  . Heart attack Mother   . Diabetes Paternal Grandmother   . Diabetes Paternal Grandfather     Allergies  Allergen Reactions  . Codeine Other (See Comments)    Cause pt to be hyper     REVIEW OF SYSTEMS (Negative unless checked)  Constitutional: [] Weight loss  [] Fever  [] Chills Cardiac: [] Chest pain   [] Chest pressure   [] Palpitations   [] Shortness of  breath when laying flat   [] Shortness of breath with exertion. Vascular:  [] Pain in legs with walking   [x] Pain in legs at rest  [] History of DVT   [] Phlebitis   [] Swelling in legs   [x] Varicose veins   [] Non-healing ulcers Pulmonary:   [] Uses home oxygen   [] Productive cough   [] Hemoptysis   [] Wheeze  [] COPD   [] Asthma Neurologic:  [] Dizziness   [] Seizures   [] History of stroke   [] History of TIA  [] Aphasia   [] Vissual changes   [] Weakness or numbness in arm   [] Weakness or numbness in leg Musculoskeletal:   [] Joint swelling   [] Joint pain   [] Low back pain Hematologic:  [] Easy bruising  [] Easy bleeding   [] Hypercoagulable state   [] Anemic Gastrointestinal:  [] Diarrhea   [] Vomiting  [] Gastroesophageal reflux/heartburn   [] Difficulty swallowing. Genitourinary:  [] Chronic kidney disease   [] Difficult urination  [] Frequent urination   [] Blood in urine Skin:  [] Rashes   [] Ulcers  Psychological:  [] History of anxiety   []  History of major depression.  Physical Examination  Vitals:   07/24/16 1608  BP: 99/63  Pulse: 60  Weight: 287 lb (130.2 kg)  Height: 5\' 11"  (1.803 m)   Body mass index is 40.03 kg/m. Gen: WD/WN, NAD Head: [place]/AT, No temporalis wasting.  Ear/Nose/Throat: Hearing grossly intact, nares w/o erythema or drainage Eyes: PER, EOMI, sclera nonicteric.  Neck: Supple, no large masses.   Pulmonary:  Good air movement,  no audible wheezing bilaterally, no use of accessory muscles.  Cardiac: RRR, no JVD Vascular: Large varicosities present extensively greater than 10 mm right leg.  Mild venous stasis changes to the legs bilaterally.  2+ soft pitting edema Vessel Right Left  Radial Palpable Palpable  Ulnar Palpable Palpable  Brachial Palpable Palpable  Carotid Palpable Palpable  Femoral Palpable Palpable  Popliteal Palpable Palpable  PT Palpable Palpable  DP Palpable Palpable  Gastrointestinal: Non-distended. No guarding/no peritoneal signs.  Musculoskeletal: M/S 5/5 throughout.   No deformity or atrophy.  Neurologic: CN 2-12 intact. Symmetrical.  Speech is fluent. Motor exam as listed above. Psychiatric: Judgment intact, Mood & affect appropriate for pt's clinical situation. Dermatologic: No rashes or ulcers noted.  No changes consistent with cellulitis. Lymph : No lichenification or skin changes of chronic lymphedema.  CBC Lab Results  Component Value Date   WBC 7.2 04/28/2008   HGB 13.1 04/28/2008   HCT 38.8 04/28/2008   MCV 91.1 04/28/2008   PLT 294.0 04/28/2008    BMET    Component Value Date/Time   NA 139 04/28/2008 1100   K 3.8 04/28/2008 1100   CL 106 04/28/2008 1100   CO2 29 04/28/2008 1100   GLUCOSE 94 04/28/2008 1100   BUN 7 04/28/2008 1100   CREATININE 0.6 04/28/2008 1100   CALCIUM 8.7 04/28/2008 1100   GFRNONAA 123.10 04/28/2008 1100   GFRAA 151 03/27/2007 1526   CrCl cannot be calculated (Patient's most recent lab result is older than the maximum 21 days allowed.).  COAG No results found for: INR, PROTIME  Radiology No results found.  Assessment/Plan 1. Varicose veins of bilateral lower extremities with pain Recommend  I have reviewed my previous  discussion with the patient regarding  varicose veins and why they cause symptoms. Patient will continue  wearing graduated compression stockings class 1 on a daily basis, beginning first thing in the morning and removing them in the evening.    In addition, behavioral modification including elevation during the day was again discussed and this will continue.  The patient has utilized over the counter pain medications and has been exercising.  However, at this time conservative therapy has not alleviated the patient's symptoms of leg pain and swelling  Recommend: laser ablation of the right great and small saphenous veins to eliminate the symptoms of pain and swelling of the lower extremities caused by the severe superficial venous reflux disease.   2. Chronic venous  insufficiency No surgery or intervention at this point in time.    I have had a long discussion with the patient regarding venous insufficiency and why it  causes symptoms. I have discussed with the patient the chronic skin changes that accompany venous insufficiency and the long term sequela such as infection and ulceration.  Patient will begin wearing graduated compression stockings class 1 (20-30 mmHg) or compression wraps on a daily basis a prescription was given. The patient will put the stockings on first thing in the morning and removing them in the evening. The patient is instructed specifically not to sleep in the stockings.    In addition, behavioral modification including several periods of elevation of the lower extremities during the day will be continued. I have demonstrated that proper elevation is a position with the ankles at heart level.  The patient is instructed to begin routine exercise, especially walking on a daily basis  Following the review of the ultrasound the patient will follow up in 2-3 months to reassess the degree  of swelling and the control that graduated compression stockings or compression wraps  is offering.   The patient can be assessed for a Lymph Pump at that time  3. Bilateral lower extremity edema See 1&2    Hortencia Pilar, MD  07/24/2016 8:17 PM

## 2016-08-04 ENCOUNTER — Other Ambulatory Visit: Payer: Self-pay | Admitting: Nurse Practitioner

## 2016-08-04 DIAGNOSIS — F331 Major depressive disorder, recurrent, moderate: Secondary | ICD-10-CM | POA: Diagnosis not present

## 2016-08-04 DIAGNOSIS — Z1231 Encounter for screening mammogram for malignant neoplasm of breast: Secondary | ICD-10-CM

## 2016-08-04 DIAGNOSIS — L209 Atopic dermatitis, unspecified: Secondary | ICD-10-CM | POA: Diagnosis not present

## 2016-08-04 DIAGNOSIS — F411 Generalized anxiety disorder: Secondary | ICD-10-CM | POA: Diagnosis not present

## 2016-08-04 DIAGNOSIS — K219 Gastro-esophageal reflux disease without esophagitis: Secondary | ICD-10-CM | POA: Diagnosis not present

## 2016-08-04 DIAGNOSIS — F17210 Nicotine dependence, cigarettes, uncomplicated: Secondary | ICD-10-CM | POA: Diagnosis not present

## 2016-08-22 ENCOUNTER — Ambulatory Visit
Admission: RE | Admit: 2016-08-22 | Discharge: 2016-08-22 | Disposition: A | Discharge status: Home / Self Care | Payer: BLUE CROSS/BLUE SHIELD | Source: Ambulatory Visit | Attending: Nurse Practitioner | Admitting: Nurse Practitioner

## 2016-08-22 ENCOUNTER — Encounter: Payer: Self-pay | Admitting: Radiology

## 2016-08-22 DIAGNOSIS — R928 Other abnormal and inconclusive findings on diagnostic imaging of breast: Secondary | ICD-10-CM | POA: Diagnosis not present

## 2016-08-22 DIAGNOSIS — Z1231 Encounter for screening mammogram for malignant neoplasm of breast: Secondary | ICD-10-CM

## 2016-08-24 ENCOUNTER — Other Ambulatory Visit: Payer: Self-pay | Admitting: Nurse Practitioner

## 2016-08-24 DIAGNOSIS — N6489 Other specified disorders of breast: Secondary | ICD-10-CM

## 2016-08-24 DIAGNOSIS — R928 Other abnormal and inconclusive findings on diagnostic imaging of breast: Secondary | ICD-10-CM

## 2016-09-05 ENCOUNTER — Ambulatory Visit
Admission: RE | Admit: 2016-09-05 | Discharge: 2016-09-05 | Disposition: A | Discharge status: Home / Self Care | Payer: BLUE CROSS/BLUE SHIELD | Source: Ambulatory Visit | Attending: Nurse Practitioner | Admitting: Nurse Practitioner

## 2016-09-05 DIAGNOSIS — R928 Other abnormal and inconclusive findings on diagnostic imaging of breast: Secondary | ICD-10-CM

## 2016-09-05 DIAGNOSIS — N6002 Solitary cyst of left breast: Secondary | ICD-10-CM | POA: Insufficient documentation

## 2016-09-05 DIAGNOSIS — N6489 Other specified disorders of breast: Secondary | ICD-10-CM

## 2016-09-05 DIAGNOSIS — R922 Inconclusive mammogram: Secondary | ICD-10-CM | POA: Insufficient documentation

## 2016-09-05 DIAGNOSIS — N6001 Solitary cyst of right breast: Secondary | ICD-10-CM | POA: Diagnosis not present

## 2016-10-19 ENCOUNTER — Other Ambulatory Visit (INDEPENDENT_AMBULATORY_CARE_PROVIDER_SITE_OTHER): Payer: BLUE CROSS/BLUE SHIELD | Admitting: Vascular Surgery

## 2016-10-23 ENCOUNTER — Encounter (INDEPENDENT_AMBULATORY_CARE_PROVIDER_SITE_OTHER): Payer: BLUE CROSS/BLUE SHIELD

## 2016-11-02 ENCOUNTER — Encounter (INDEPENDENT_AMBULATORY_CARE_PROVIDER_SITE_OTHER): Payer: Self-pay | Admitting: Vascular Surgery

## 2016-11-02 ENCOUNTER — Ambulatory Visit (INDEPENDENT_AMBULATORY_CARE_PROVIDER_SITE_OTHER): Payer: BLUE CROSS/BLUE SHIELD | Admitting: Vascular Surgery

## 2016-11-02 VITALS — BP 103/68 | HR 71 | Resp 16 | Wt 296.0 lb

## 2016-11-02 DIAGNOSIS — F411 Generalized anxiety disorder: Secondary | ICD-10-CM | POA: Diagnosis not present

## 2016-11-02 DIAGNOSIS — I83811 Varicose veins of right lower extremities with pain: Secondary | ICD-10-CM

## 2016-11-02 DIAGNOSIS — I83813 Varicose veins of bilateral lower extremities with pain: Secondary | ICD-10-CM

## 2016-11-02 DIAGNOSIS — F331 Major depressive disorder, recurrent, moderate: Secondary | ICD-10-CM | POA: Diagnosis not present

## 2016-11-02 DIAGNOSIS — K219 Gastro-esophageal reflux disease without esophagitis: Secondary | ICD-10-CM | POA: Diagnosis not present

## 2016-11-02 DIAGNOSIS — F17210 Nicotine dependence, cigarettes, uncomplicated: Secondary | ICD-10-CM | POA: Diagnosis not present

## 2016-11-02 DIAGNOSIS — Z23 Encounter for immunization: Secondary | ICD-10-CM | POA: Diagnosis not present

## 2016-11-02 DIAGNOSIS — E669 Obesity, unspecified: Secondary | ICD-10-CM | POA: Diagnosis not present

## 2016-11-05 NOTE — Progress Notes (Signed)
    MRN : [id number]  Erin Mcdonald is a 41 y.o. (September 21, 1975) female who presents with chief complaint of  Chief Complaint  Patient presents with  . Venous Insufficiency    Right Leg GSV laser ablation  .    The patient's right lower extremity was sterilely prepped and draped.  The ultrasound machine was used to visualize the right saphenous vein throughout its course.  A segment at the level of the knee was selected for access.  The saphenous vein was accessed without difficulty using ultrasound guidance with a micropuncture needle.   An 0.018  wire was placed beyond the saphenofemoral junction through the sheath and the microneedle was removed.  The 65 cm sheath was then placed over the wire and the wire and dilator were removed.  The laser fiber was placed through the sheath and its tip was placed approximately 2 cm below the saphenofemoral junction.  Tumescent anesthesia was then created with a dilute lidocaine solution.  Laser energy was then delivered with constant withdrawal of the sheath and laser fiber.  Approximately 1296 joules of energy were delivered over a length of 36 cm.  Sterile dressings were placed.  The patient tolerated the procedure well without complications.

## 2016-11-06 ENCOUNTER — Ambulatory Visit (INDEPENDENT_AMBULATORY_CARE_PROVIDER_SITE_OTHER): Payer: BLUE CROSS/BLUE SHIELD

## 2016-11-06 ENCOUNTER — Other Ambulatory Visit (INDEPENDENT_AMBULATORY_CARE_PROVIDER_SITE_OTHER): Payer: Self-pay | Admitting: Vascular Surgery

## 2016-11-06 DIAGNOSIS — Z9889 Other specified postprocedural states: Secondary | ICD-10-CM

## 2016-11-20 ENCOUNTER — Ambulatory Visit (INDEPENDENT_AMBULATORY_CARE_PROVIDER_SITE_OTHER): Payer: BLUE CROSS/BLUE SHIELD | Admitting: Vascular Surgery

## 2016-11-20 ENCOUNTER — Encounter (INDEPENDENT_AMBULATORY_CARE_PROVIDER_SITE_OTHER): Payer: Self-pay | Admitting: Vascular Surgery

## 2016-11-20 VITALS — BP 100/58 | HR 68 | Resp 17 | Wt 295.0 lb

## 2016-11-20 DIAGNOSIS — I872 Venous insufficiency (chronic) (peripheral): Secondary | ICD-10-CM

## 2016-11-20 DIAGNOSIS — I83813 Varicose veins of bilateral lower extremities with pain: Secondary | ICD-10-CM

## 2016-11-20 DIAGNOSIS — R600 Localized edema: Secondary | ICD-10-CM

## 2016-11-26 ENCOUNTER — Encounter (INDEPENDENT_AMBULATORY_CARE_PROVIDER_SITE_OTHER): Payer: Self-pay | Admitting: Vascular Surgery

## 2016-11-26 NOTE — Progress Notes (Signed)
MRN : [id number]  Erin Mcdonald is a 41 y.o. ([birth year]) female who presents with chief complaint of  Chief Complaint  Patient presents with  . Follow-up  .  History of Present Illness:The patient returns to the office for followup status post laser ablation of the right great saphenous vein.  The patient note significant improvement in the lower extremity pain but not resolution of the symptoms. The patient notes multiple residual varicosities bilaterally which continued to hurt with dependent positions and remained tender to palpation. The patient's swelling is minimally from preoperative status. The patient continues to wear graduated compression stockings on a daily basis but these are not eliminating the pain and discomfort. The patient continues to use over-the-counter anti-inflammatory medications to treat the pain and related symptoms but this has not given the patient relief. The patient notes the pain in the lower extremities is causing problems with daily exercise, problems at work and even with household activities such as preparing meals and doing dishes.  The patient is otherwise done well and there have been no complications related to the laser procedure or interval changes in the patient's overall   Post laser ultrasound shows successful ablation of the right great saphenous vein   Current Meds  Medication Sig  . ALPRAZolam (XANAX) 1 MG tablet   . escitalopram (LEXAPRO) 20 MG tablet Take by mouth.  Marland Kitchen omeprazole (PRILOSEC) 20 MG capsule Take by mouth 2 (two) times daily.    History reviewed. No pertinent past medical history.  Past Surgical History:  Procedure Laterality Date  . TONSILLECTOMY      Social History Social History   Tobacco Use  . Smoking status: Current Every Day Smoker  . Smokeless tobacco: Never Used  Substance Use Topics  . Alcohol use: Yes    Comment: occasionally  . Drug use: No    Family History Family History  Problem Relation Age of  Onset  . Heart attack Mother   . Diabetes Paternal Grandmother   . Diabetes Paternal Grandfather     Allergies  Allergen Reactions  . Codeine Other (See Comments)    Cause pt to be hyper     REVIEW OF SYSTEMS (Negative unless checked)  Constitutional: [] Weight loss  [] Fever  [] Chills Cardiac: [] Chest pain   [] Chest pressure   [] Palpitations   [] Shortness of breath when laying flat   [] Shortness of breath with exertion. Vascular:  [] Pain in legs with walking   [x] Pain in legs with standing  [] History of DVT   [] Phlebitis   [x] Swelling in legs   [x] Varicose veins   [] Non-healing ulcers Pulmonary:   [] Uses home oxygen   [] Productive cough   [] Hemoptysis   [] Wheeze  [] COPD   [] Asthma Neurologic:  [] Dizziness   [] Seizures   [] History of stroke   [] History of TIA  [] Aphasia   [] Vissual changes   [] Weakness or numbness in arm   [] Weakness or numbness in leg Musculoskeletal:   [] Joint swelling   [] Joint pain   [] Low back pain Hematologic:  [] Easy bruising  [] Easy bleeding   [] Hypercoagulable state   [] Anemic Gastrointestinal:  [] Diarrhea   [] Vomiting  [] Gastroesophageal reflux/heartburn   [] Difficulty swallowing. Genitourinary:  [] Chronic kidney disease   [] Difficult urination  [] Frequent urination   [] Blood in urine Skin:  [] Rashes   [] Ulcers  Psychological:  [] History of anxiety   []  History of major depression.  Physical Examination  Vitals:   11/20/16 1452  BP: (!) 100/58  Pulse: 68  Resp: 17  Weight: 295 lb (133.8 kg)   Body mass index is 41.14 kg/m. Gen: WD/WN, NAD Head: [place]/AT, No temporalis wasting.  Ear/Nose/Throat: Hearing grossly intact, nares w/o erythema or drainage Eyes: PER, EOMI, sclera nonicteric.  Neck: Supple, no large masses.   Pulmonary:  Good air movement, no audible wheezing bilaterally, no use of accessory muscles.  Cardiac: RRR, no JVD Vascular:Large varicosities present extensively greater than 10 mm bilaterally.  Mild venous stasis changes to the legs  bilaterally.  2+ soft pitting edema Vessel Right Left  Radial Palpable Palpable  PT Palpable Palpable  DP Palpable Palpable  Gastrointestinal: Non-distended. No guarding/no peritoneal signs.  Musculoskeletal: M/S 5/5 throughout.  No deformity or atrophy.  Neurologic: CN 2-12 intact. Symmetrical.  Speech is fluent. Motor exam as listed above. Psychiatric: Judgment intact, Mood & affect appropriate for pt's clinical situation. Dermatologic: venous rashes no ulcers noted.  No changes consistent with cellulitis. Lymph : No lichenification or skin changes of chronic lymphedema.  CBC Lab Results  Component Value Date   WBC 7.2 04/28/2008   HGB 13.1 04/28/2008   HCT 38.8 04/28/2008   MCV 91.1 04/28/2008   PLT 294.0 04/28/2008    BMET    Component Value Date/Time   NA 139 04/28/2008 1100   K 3.8 04/28/2008 1100   CL 106 04/28/2008 1100   CO2 29 04/28/2008 1100   GLUCOSE 94 04/28/2008 1100   BUN 7 04/28/2008 1100   CREATININE 0.6 04/28/2008 1100   CALCIUM 8.7 04/28/2008 1100   GFRNONAA 123.10 04/28/2008 1100   GFRAA 151 03/27/2007 1526   CrCl cannot be calculated (Patient's most recent lab result is older than the maximum 21 days allowed.).  COAG No results found for: INR, PROTIME  Radiology No results found.  Assessment/Plan 1. Varicose veins of bilateral lower extremities with pain Recommend:  The patient has had successful ablation of the previously incompetent saphenous venous system but still has persistent symptoms of pain and swelling that are having a negative impact on daily life and daily activities.  Patient should undergo injection sclerotherapy to treat the residual varicosities.  The risks, benefits and alternative therapies were reviewed in detail with the patient.  All questions were answered.  The patient agrees to proceed with sclerotherapy at their convenience.  The patient will continue wearing the graduated compression stockings and using the  over-the-counter pain medications to treat her symptoms.       2. Chronic venous insufficiency No surgery or intervention at this point in time.    I have had a long discussion with the patient regarding venous insufficiency and why it  causes symptoms. I have discussed with the patient the chronic skin changes that accompany venous insufficiency and the long term sequela such as infection and ulceration.  Patient will begin wearing graduated compression stockings class 1 (20-30 mmHg) or compression wraps on a daily basis a prescription was given. The patient will put the stockings on first thing in the morning and removing them in the evening. The patient is instructed specifically not to sleep in the stockings.    In addition, behavioral modification including several periods of elevation of the lower extremities during the day will be continued. I have demonstrated that proper elevation is a position with the ankles at heart level.  The patient is instructed to begin routine exercise, especially walking on a daily basis  Following the review of the ultrasound the patient will follow up in 2-3 months to reassess the degree of swelling  and the control that graduated compression stockings or compression wraps  is offering.   The patient can be assessed for a Lymph Pump at that time  3. Bilateral lower extremity edema See #1&2    Hortencia Pilar, MD  11/26/2016 3:01 PM

## 2016-12-25 ENCOUNTER — Ambulatory Visit (INDEPENDENT_AMBULATORY_CARE_PROVIDER_SITE_OTHER): Payer: BLUE CROSS/BLUE SHIELD | Admitting: Vascular Surgery

## 2017-01-03 ENCOUNTER — Ambulatory Visit: Payer: Self-pay | Admitting: Nurse Practitioner

## 2017-01-03 ENCOUNTER — Other Ambulatory Visit: Payer: Self-pay | Admitting: Nurse Practitioner

## 2017-01-03 DIAGNOSIS — J101 Influenza due to other identified influenza virus with other respiratory manifestations: Secondary | ICD-10-CM

## 2017-01-03 DIAGNOSIS — J069 Acute upper respiratory infection, unspecified: Secondary | ICD-10-CM

## 2017-01-03 MED ORDER — AZITHROMYCIN 250 MG PO TABS: ORAL_TABLET | ORAL | 0 refills | Status: DC

## 2017-01-03 MED ORDER — OSELTAMIVIR PHOSPHATE 75 MG PO CAPS
75.0000 mg | ORAL_CAPSULE | Freq: Two times a day (BID) | ORAL | 0 refills | Status: DC
Start: 2017-01-03 — End: 2017-05-17

## 2017-01-03 NOTE — Progress Notes (Signed)
Sent in prescriptions for zithromax 250mg , taking two tablets tdaoy and 1 tablet for follow ing nine days. Also added tamiflu 75mg  twice daily for 5 days.

## 2017-01-10 ENCOUNTER — Telehealth: Payer: Self-pay

## 2017-01-10 ENCOUNTER — Other Ambulatory Visit: Payer: Self-pay | Admitting: Nurse Practitioner

## 2017-01-10 DIAGNOSIS — H60503 Unspecified acute noninfective otitis externa, bilateral: Secondary | ICD-10-CM

## 2017-01-10 DIAGNOSIS — J069 Acute upper respiratory infection, unspecified: Secondary | ICD-10-CM

## 2017-01-10 MED ORDER — NEOMYCIN-POLYMYXIN-HC 3.5-10000-1 OT SOLN: 4.0000 [drp] | Freq: Three times a day (TID) | OTIC | 0 refills | Status: DC

## 2017-01-10 MED ORDER — AZITHROMYCIN 250 MG PO TABS: ORAL_TABLET | ORAL | 0 refills | Status: DC

## 2017-01-10 NOTE — Progress Notes (Signed)
Will have her repeat 10 day course of zithromax. Added neomycin ear drops TID for 7 days. Both sent to [facility] [address].

## 2017-01-10 NOTE — Telephone Encounter (Signed)
Pt advised we send antibiotic  

## 2017-01-18 ENCOUNTER — Ambulatory Visit: Payer: BLUE CROSS/BLUE SHIELD | Admitting: Nurse Practitioner

## 2017-01-18 ENCOUNTER — Encounter: Payer: Self-pay | Admitting: Nurse Practitioner

## 2017-01-18 VITALS — BP 120/78 | HR 96 | Temp 98.1°F | Resp 16 | Ht 71.0 in | Wt 302.0 lb

## 2017-01-18 DIAGNOSIS — B009 Herpesviral infection, unspecified: Secondary | ICD-10-CM

## 2017-01-18 DIAGNOSIS — J069 Acute upper respiratory infection, unspecified: Secondary | ICD-10-CM | POA: Diagnosis not present

## 2017-01-18 DIAGNOSIS — L209 Atopic dermatitis, unspecified: Secondary | ICD-10-CM

## 2017-01-18 DIAGNOSIS — F331 Major depressive disorder, recurrent, moderate: Secondary | ICD-10-CM | POA: Insufficient documentation

## 2017-01-18 DIAGNOSIS — K219 Gastro-esophageal reflux disease without esophagitis: Secondary | ICD-10-CM

## 2017-01-18 DIAGNOSIS — F411 Generalized anxiety disorder: Secondary | ICD-10-CM | POA: Diagnosis not present

## 2017-01-18 DIAGNOSIS — F321 Major depressive disorder, single episode, moderate: Secondary | ICD-10-CM

## 2017-01-18 DIAGNOSIS — H60312 Diffuse otitis externa, left ear: Secondary | ICD-10-CM

## 2017-01-18 MED ORDER — CIPROFLOXACIN-HYDROCORTISONE 0.2-1 % OT SUSP: 3.0000 [drp] | Freq: Two times a day (BID) | OTIC | 1 refills | Status: DC

## 2017-01-18 MED ORDER — AZITHROMYCIN 250 MG PO TABS: ORAL_TABLET | ORAL | 0 refills | Status: DC

## 2017-01-18 MED ORDER — VALACYCLOVIR HCL 1 G PO TABS: 1000.0000 mg | ORAL_TABLET | Freq: Every day | ORAL | 3 refills | Status: DC

## 2017-01-18 MED ORDER — OMEPRAZOLE 20 MG PO CPDR: 20.0000 mg | DELAYED_RELEASE_CAPSULE | Freq: Two times a day (BID) | ORAL | 3 refills | Status: DC

## 2017-01-18 MED ORDER — TRIAMCINOLONE ACETONIDE 0.1 % EX CREA: 1.0000 "application " | TOPICAL_CREAM | Freq: Two times a day (BID) | CUTANEOUS | 1 refills | Status: DC

## 2017-01-18 MED ORDER — ALPRAZOLAM 1 MG PO TABS: ORAL_TABLET | ORAL | 3 refills | Status: DC

## 2017-01-18 MED ORDER — ESCITALOPRAM OXALATE 20 MG PO TABS: ORAL_TABLET | ORAL | 3 refills | Status: DC

## 2017-01-18 NOTE — Progress Notes (Signed)
[facility] [place], [place] [address]  Internal MEDICINE  Office Visit Note  Patient Name: Erin Mcdonald  [id number]  [id number]  Date of Service: 01/18/2017     Complaints/HPI Pt is here for routine follow up.  The patient is here for routine follow up. Recently treated with tamiflu and z-pack for the flu over christmas. Followed this with ear drops, but left ear is still very congested. States that she can hardly hear out of this ear. She is otherwise, feeling much better .    Current Medication: Outpatient Encounter Medications as of 01/18/2017  Medication Sig  . ALPRAZolam (XANAX) 1 MG tablet   . ergocalciferol (VITAMIN D2) 50000 units capsule Take 50,000 Units by mouth once a week.  . escitalopram (LEXAPRO) 20 MG tablet Take by mouth.  Marland Kitchen omeprazole (PRILOSEC) 20 MG capsule Take by mouth 2 (two) times daily.  . valACYclovir (VALTREX) 1000 MG tablet Take 1,000 mg by mouth daily.  Marland Kitchen azithromycin (ZITHROMAX) 250 MG tablet Take 2 tablets po today. Take 1 tablet po QD starting tomorrow through next 9 days. (Patient not taking: Reported on 01/18/2017)  . neomycin-polymyxin-hydrocortisone (CORTISPORIN) OTIC solution Place 4 drops into both ears 3 (three) times daily. (Patient not taking: Reported on 01/18/2017)  . oseltamivir (TAMIFLU) 75 MG capsule Take 1 capsule (75 mg total) by mouth 2 (two) times daily. (Patient not taking: Reported on 01/18/2017)   No facility-administered encounter medications on file as of 01/18/2017.     Surgical History: Past Surgical History:  Procedure Laterality Date  . LAPAROSCOPIC GASTRIC SLEEVE RESECTION    . TONSILLECTOMY      Medical History: Past Medical History:  Diagnosis Date  . Anxiety   . Depression   . Sleep apnea     Family History: Family History  Problem Relation Age of Onset  . Heart attack Mother   . Diabetes Paternal Grandmother   . Diabetes Paternal Grandfather     Social History   Socioeconomic  History  . Marital status: Single    Spouse name: Not on file  . Number of children: Not on file  . Years of education: Not on file  . Highest education level: Not on file  Social Needs  . Financial resource strain: Not on file  . Food insecurity - worry: Not on file  . Food insecurity - inability: Not on file  . Transportation needs - medical: Not on file  . Transportation needs - non-medical: Not on file  Occupational History  . Not on file  Tobacco Use  . Smoking status: Current Every Day Smoker  . Smokeless tobacco: Never Used  Substance and Sexual Activity  . Alcohol use: Yes    Comment: occasionally  . Drug use: No  . Sexual activity: Not on file  Other Topics Concern  . Not on file  Social History Narrative  . Not on file      Review of Systems  Constitutional: Positive for chills.  HENT: Positive for congestion, ear pain, postnasal drip, rhinorrhea, sinus pain, sore throat and voice change.   Respiratory: Positive for cough and wheezing.   Cardiovascular: Negative.   Gastrointestinal: Negative.   Endocrine: Negative.   Musculoskeletal: Negative.   Skin: Negative.   Allergic/Immunologic: Negative.   Neurological: Positive for headaches.  Psychiatric/Behavioral: Negative.     Today's Vitals   01/18/17 0905  BP: 120/78  Pulse: 96  Resp: 16  Temp: 98.1 F (36.7 C)  SpO2: 98%  Weight: Marland Kitchen)  302 lb (137 kg)  Height: 5\' 11"  (1.803 m)    Physical Exam  Constitutional: She is oriented to person, place, and time. She appears well-developed and well-nourished.  HENT:  Head: Normocephalic and atraumatic.  Right Ear: Tympanic membrane and ear canal normal.  Left Ear: There is tenderness. Tympanic membrane is erythematous and bulging.  Nose: Rhinorrhea present. Left sinus exhibits maxillary sinus tenderness.  Mouth/Throat: Uvula is midline, oropharynx is clear and moist and mucous membranes are normal.  Eyes: Pupils are equal, round, and reactive to light.   Neck: Normal range of motion. Neck supple.  Cardiovascular: Normal rate, regular rhythm and normal heart sounds.  Pulmonary/Chest: Effort normal and breath sounds normal. She has no wheezes.  Abdominal: Soft. Bowel sounds are normal. There is no tenderness.  Musculoskeletal: Normal range of motion.  Neurological: She is alert and oriented to person, place, and time.  Skin: Skin is warm and dry.  Psychiatric: She has a normal mood and affect.  Nursing note and vitals reviewed.   Assessment/Plan:     ICD-10-CM   1. Acute diffuse otitis externa of left ear H60.312 ciprofloxacin-hydrocortisone (CIPRO HC) OTIC suspension  2. Acute upper respiratory infection J06.9 azithromycin (ZITHROMAX) 250 MG tablet  3. Atopic dermatitis, unspecified type L20.9 triamcinolone cream (KENALOG) 0.1 %  4. Herpes simplex disease B00.9 valACYclovir (VALTREX) 1000 MG tablet  5. Gastroesophageal reflux disease without esophagitis K21.9 omeprazole (PRILOSEC) 20 MG capsule  6. Depression, major, single episode, moderate (HCC) F32.1 escitalopram (LEXAPRO) 20 MG tablet  7. GAD (generalized anxiety disorder) F41.1 ALPRAZolam (XANAX) 1 MG tablet   1. Will do round of cipro HC ear drops for left ear - inserting 3 drops into the ear twice daily. Refer to ENT if no improvement over the next week 2. Repeat treatment with Zpack. Take as directed for 5 days  3. Add triamcinolone 0.1$ cream. Apply to affected areas BID prn  4. Continue acyclovir daily for preventive therapy.  5. Continue omeprazole 20mg  BID 6. lexapro 20mg  - take 1.5tablets daily. New rx provided today 7.. May continue alprazolam 1mg  twice daily. May also take 1/2 tablet at bedtime if needed. A new prescription was sent to her pharmacy.   General Counseling: I have discussed the findings of the evaluation and examination with Erin Mcdonald.  I have also discussed any further diagnostic evaluation that may be needed or ordered today. Erin Mcdonald verbalizes understanding of  the findings of todays visit. We also reviewed her medications today. she has been encouraged to call the office with any questions or concerns that should arise related to todays visit.  This patient was seen by Leretha Pol, FNP- C in Collaboration with Dr Lavera Guise as a part of collaborative care agreement    Time spent:20 minutes    Dr Lavera Guise Internal medicine

## 2017-01-22 ENCOUNTER — Ambulatory Visit (INDEPENDENT_AMBULATORY_CARE_PROVIDER_SITE_OTHER): Payer: BLUE CROSS/BLUE SHIELD | Admitting: Vascular Surgery

## 2017-01-29 ENCOUNTER — Encounter (INDEPENDENT_AMBULATORY_CARE_PROVIDER_SITE_OTHER): Payer: Self-pay | Admitting: Vascular Surgery

## 2017-01-29 ENCOUNTER — Ambulatory Visit (INDEPENDENT_AMBULATORY_CARE_PROVIDER_SITE_OTHER): Payer: BLUE CROSS/BLUE SHIELD | Admitting: Vascular Surgery

## 2017-01-29 VITALS — BP 135/78 | HR 85 | Resp 17 | Ht 71.0 in | Wt 299.0 lb

## 2017-01-29 DIAGNOSIS — I83813 Varicose veins of bilateral lower extremities with pain: Secondary | ICD-10-CM | POA: Diagnosis not present

## 2017-01-29 NOTE — Addendum Note (Signed)
Addended by: Marcelle Overlie A on: 01/29/2017 04:29 PM   Modules accepted: Level of Service

## 2017-01-29 NOTE — Progress Notes (Signed)
Varicose veins of right  lower extremity with inflammation (454.1  I83.10) Current Plans   Indication: Patient presents with symptomatic varicose veins of the right  lower extremity.   Procedure: Sclerotherapy using hypertonic saline mixed with 1% Lidocaine was performed on the right lower extremity. Compression wraps were placed. The patient tolerated the procedure well. 

## 2017-02-12 ENCOUNTER — Encounter (INDEPENDENT_AMBULATORY_CARE_PROVIDER_SITE_OTHER): Payer: Self-pay | Admitting: Vascular Surgery

## 2017-02-12 ENCOUNTER — Ambulatory Visit (INDEPENDENT_AMBULATORY_CARE_PROVIDER_SITE_OTHER): Payer: BLUE CROSS/BLUE SHIELD | Admitting: Vascular Surgery

## 2017-02-12 VITALS — BP 124/72 | HR 74 | Resp 18 | Wt 295.0 lb

## 2017-02-12 DIAGNOSIS — I83813 Varicose veins of bilateral lower extremities with pain: Secondary | ICD-10-CM | POA: Diagnosis not present

## 2017-02-12 NOTE — Progress Notes (Signed)
Varicose veins of right  lower extremity with inflammation (454.1  I83.10) Current Plans   Indication: Patient presents with symptomatic varicose veins of the right  lower extremity.   Procedure: Sclerotherapy using hypertonic saline mixed with 1% Lidocaine was performed on the right lower extremity. Compression wraps were placed. The patient tolerated the procedure well. 

## 2017-02-21 ENCOUNTER — Encounter: Payer: Self-pay | Admitting: Obstetrics and Gynecology

## 2017-02-21 ENCOUNTER — Ambulatory Visit: Payer: BLUE CROSS/BLUE SHIELD | Admitting: Obstetrics and Gynecology

## 2017-02-21 VITALS — BP 128/88 | Ht 69.0 in | Wt 270.0 lb

## 2017-02-21 DIAGNOSIS — B373 Candidiasis of vulva and vagina: Secondary | ICD-10-CM | POA: Diagnosis not present

## 2017-02-21 DIAGNOSIS — Z113 Encounter for screening for infections with a predominantly sexual mode of transmission: Secondary | ICD-10-CM | POA: Diagnosis not present

## 2017-02-21 DIAGNOSIS — B3731 Acute candidiasis of vulva and vagina: Secondary | ICD-10-CM

## 2017-02-21 LAB — POCT WET PREP WITH KOH
Clue Cells Wet Prep HPF POC: NEGATIVE
KOH Prep POC: NEGATIVE
TRICHOMONAS UA: NEGATIVE
Yeast Wet Prep HPF POC: NEGATIVE

## 2017-02-21 MED ORDER — FLUCONAZOLE 150 MG PO TABS: 150.0000 mg | ORAL_TABLET | Freq: Once | ORAL | 0 refills | Status: AC

## 2017-02-21 NOTE — Progress Notes (Signed)
Chief Complaint  Patient presents with  . Vaginitis    HPI:      Ms. Erin Mcdonald is a 42 y.o. G0P0000 who LMP was Patient's last menstrual period was 01/17/2017., presents today for vaginal itching, increased d/c, no odor for 3 days. Took leftover diflucan yesterday but no relief yet. Was on abx a few wks ago with flu. Is sex active, new partner. Wants STD testing. No fevers, LBP, belly pain, urin sx.  Past due for annual.    Past Medical History:  Diagnosis Date  . Anxiety   . Depression   . Sleep apnea     Past Surgical History:  Procedure Laterality Date  . LAPAROSCOPIC GASTRIC SLEEVE RESECTION    . TONSILLECTOMY      Family History  Problem Relation Age of Onset  . Heart attack Mother   . Diabetes Paternal Grandmother   . Diabetes Paternal Grandfather     Social History   Socioeconomic History  . Marital status: Single    Spouse name: Not on file  . Number of children: Not on file  . Years of education: Not on file  . Highest education level: Not on file  Social Needs  . Financial resource strain: Not on file  . Food insecurity - worry: Not on file  . Food insecurity - inability: Not on file  . Transportation needs - medical: Not on file  . Transportation needs - non-medical: Not on file  Occupational History  . Not on file  Tobacco Use  . Smoking status: Current Every Day Smoker  . Smokeless tobacco: Never Used  Substance and Sexual Activity  . Alcohol use: Yes    Comment: occasionally  . Drug use: No  . Sexual activity: Yes    Birth control/protection: None  Other Topics Concern  . Not on file  Social History Narrative  . Not on file     Current Outpatient Medications:  .  ALPRAZolam (XANAX) 1 MG tablet, Take 1 tablet po Qam and Qpm. May take additional 1/2 tablet QHS prn, Disp: 75 tablet, Rfl: 3 .  azithromycin (ZITHROMAX) 250 MG tablet, Take 2 tablets po today. Take 1 tablet po QD starting tomorrow through next 9 days., Disp: 11 tablet, Rfl:  0 .  ciprofloxacin-hydrocortisone (CIPRO HC) OTIC suspension, Place 3 drops into the left ear 2 (two) times daily. (Patient not taking: Reported on 02/12/2017), Disp: 10 mL, Rfl: 1 .  ergocalciferol (VITAMIN D2) 50000 units capsule, Take 50,000 Units by mouth once a week., Disp: , Rfl:  .  escitalopram (LEXAPRO) 20 MG tablet, Take 1.5tablets po QD, Disp: 45 tablet, Rfl: 3 .  fluconazole (DIFLUCAN) 150 MG tablet, Take 1 tablet (150 mg total) by mouth once for 1 dose., Disp: 1 tablet, Rfl: 0 .  neomycin-polymyxin-hydrocortisone (CORTISPORIN) OTIC solution, Place 4 drops into both ears 3 (three) times daily., Disp: 10 mL, Rfl: 0 .  omeprazole (PRILOSEC) 20 MG capsule, Take 1 capsule (20 mg total) by mouth 2 (two) times daily., Disp: 60 capsule, Rfl: 3 .  oseltamivir (TAMIFLU) 75 MG capsule, Take 1 capsule (75 mg total) by mouth 2 (two) times daily., Disp: 10 capsule, Rfl: 0 .  triamcinolone cream (KENALOG) 0.1 %, Apply 1 application topically 2 (two) times daily., Disp: 60 g, Rfl: 1 .  valACYclovir (VALTREX) 1000 MG tablet, Take 1 tablet (1,000 mg total) by mouth daily., Disp: 30 tablet, Rfl: 3   ROS:  Review of Systems  Constitutional: Negative for fever.  Gastrointestinal: Negative for blood in stool, constipation, diarrhea, nausea and vomiting.  Genitourinary: Positive for vaginal discharge. Negative for dyspareunia, dysuria, flank pain, frequency, hematuria, urgency, vaginal bleeding and vaginal pain.  Musculoskeletal: Negative for back pain.  Skin: Negative for rash.     OBJECTIVE:   Vitals:  BP 128/88   Ht 5\' 9"  (1.753 m)   Wt 270 lb (122.5 kg)   LMP 01/17/2017   BMI 39.87 kg/m   Physical Exam  Constitutional: She is oriented to person, place, and time and well-developed, well-nourished, and in no distress. Vital signs are normal.  Genitourinary: Uterus normal, cervix normal, right adnexa normal and left adnexa normal. Uterus is not enlarged. Cervix exhibits no motion tenderness  and no tenderness. Right adnexum displays no mass and no tenderness. Left adnexum displays no mass and no tenderness. Vulva exhibits erythema and exudate. Vulva exhibits no lesion, no rash and no tenderness. Vagina exhibits no lesion. Thick  odorless  white and vaginal discharge found.  Neurological: She is oriented to person, place, and time.  Vitals reviewed.   Results: Results for orders placed or performed in visit on 02/21/17 (from the past 24 hour(s))  POCT Wet Prep with KOH     Status: Normal   Collection Time: 02/21/17  4:46 PM  Result Value Ref Range   Trichomonas, UA Negative    Clue Cells Wet Prep HPF POC neg    Epithelial Wet Prep HPF POC  Few, Moderate, Many, Too numerous to count   Yeast Wet Prep HPF POC neg    Bacteria Wet Prep HPF POC  Few   RBC Wet Prep HPF POC     WBC Wet Prep HPF POC     KOH Prep POC Negative Negative     Assessment/Plan: Candidal vaginitis - Neg wet prep (took diflucan yesterday)/pos exam. Rx RF diflucan prn. F/u prn.  - Plan: fluconazole (DIFLUCAN) 150 MG tablet, POCT Wet Prep with KOH  Screening for STD (sexually transmitted disease) - Will call pt with results.  - Plan: Chlamydia/Gonococcus/Trichomonas, NAA    Meds ordered this encounter  Medications  . fluconazole (DIFLUCAN) 150 MG tablet    Sig: Take 1 tablet (150 mg total) by mouth once for 1 dose.    Dispense:  1 tablet    Refill:  0    Order Specific Question:   Supervising Provider    Answer:   Gae Dry [[id number]]      Return in about 3 weeks (around 0/04/5407) for annual.  Gerrad Welker B. Deamber Buckhalter, PA-C 02/21/2017 4:47 PM

## 2017-02-21 NOTE — Patient Instructions (Signed)
I value your feedback and entrusting us with your care. If you get a [facility] patient survey, I would appreciate you taking the time to let us know about your experience today. Thank you! 

## 2017-02-22 ENCOUNTER — Ambulatory Visit (INDEPENDENT_AMBULATORY_CARE_PROVIDER_SITE_OTHER): Payer: BLUE CROSS/BLUE SHIELD | Admitting: Vascular Surgery

## 2017-02-22 ENCOUNTER — Encounter (INDEPENDENT_AMBULATORY_CARE_PROVIDER_SITE_OTHER): Payer: Self-pay | Admitting: Vascular Surgery

## 2017-02-22 VITALS — BP 114/72 | HR 76 | Resp 18 | Ht 71.0 in | Wt 296.0 lb

## 2017-02-22 DIAGNOSIS — I83813 Varicose veins of bilateral lower extremities with pain: Secondary | ICD-10-CM | POA: Diagnosis not present

## 2017-02-22 NOTE — Progress Notes (Signed)
Varicose veins of right  lower extremity with inflammation (454.1  I83.10) Current Plans   Indication: Patient presents with symptomatic varicose veins of the right  lower extremity.   Procedure: Sclerotherapy using hypertonic saline mixed with 1% Lidocaine was performed on the right lower extremity. Compression wraps were placed. The patient tolerated the procedure well. 

## 2017-02-25 LAB — CHLAMYDIA/GONOCOCCUS/TRICHOMONAS, NAA
CHLAMYDIA BY NAA: NEGATIVE
Gonococcus by NAA: NEGATIVE
TRICH VAG BY NAA: NEGATIVE

## 2017-03-22 ENCOUNTER — Other Ambulatory Visit: Payer: Self-pay

## 2017-03-22 MED ORDER — ERGOCALCIFEROL 1.25 MG (50000 UT) PO CAPS: 50000.0000 [IU] | ORAL_CAPSULE | ORAL | 1 refills | Status: DC

## 2017-05-17 ENCOUNTER — Ambulatory Visit: Payer: BLUE CROSS/BLUE SHIELD | Admitting: Nurse Practitioner

## 2017-05-17 ENCOUNTER — Encounter: Payer: Self-pay | Admitting: Nurse Practitioner

## 2017-05-17 VITALS — BP 118/70 | HR 55 | Resp 16 | Ht 69.0 in | Wt 296.0 lb

## 2017-05-17 DIAGNOSIS — Z6841 Body Mass Index (BMI) 40.0 and over, adult: Secondary | ICD-10-CM

## 2017-05-17 DIAGNOSIS — L209 Atopic dermatitis, unspecified: Secondary | ICD-10-CM

## 2017-05-17 DIAGNOSIS — F411 Generalized anxiety disorder: Secondary | ICD-10-CM | POA: Diagnosis not present

## 2017-05-17 DIAGNOSIS — K219 Gastro-esophageal reflux disease without esophagitis: Secondary | ICD-10-CM | POA: Diagnosis not present

## 2017-05-17 DIAGNOSIS — F321 Major depressive disorder, single episode, moderate: Secondary | ICD-10-CM

## 2017-05-17 DIAGNOSIS — B009 Herpesviral infection, unspecified: Secondary | ICD-10-CM

## 2017-05-17 MED ORDER — PHENTERMINE HCL 37.5 MG PO TABS: 37.5000 mg | ORAL_TABLET | Freq: Every day | ORAL | 1 refills | Status: DC

## 2017-05-17 MED ORDER — VALACYCLOVIR HCL 1 G PO TABS: 1000.0000 mg | ORAL_TABLET | Freq: Every day | ORAL | 3 refills | Status: DC

## 2017-05-17 MED ORDER — ALPRAZOLAM 1 MG PO TABS: ORAL_TABLET | ORAL | 3 refills | Status: DC

## 2017-05-17 MED ORDER — ESCITALOPRAM OXALATE 20 MG PO TABS: ORAL_TABLET | ORAL | 3 refills | Status: DC

## 2017-05-17 MED ORDER — CLOBETASOL PROP EMOLLIENT BASE 0.05 % EX CREA: 1.0000 "application " | TOPICAL_CREAM | Freq: Two times a day (BID) | CUTANEOUS | 3 refills | Status: DC

## 2017-05-17 MED ORDER — OMEPRAZOLE 20 MG PO CPDR
20.0000 mg | DELAYED_RELEASE_CAPSULE | Freq: Two times a day (BID) | ORAL | 3 refills | Status: DC
Start: 2017-05-17 — End: 2017-08-30

## 2017-05-17 NOTE — Progress Notes (Signed)
[facility] [place], [place] [address]  Internal MEDICINE  Office Visit Note  Patient Name: Erin Mcdonald  [id number]  [id number]  Date of Service: 06/10/2017  Chief Complaint  Patient presents with  . Gastroesophageal Reflux  . Depression  . Weight Gain    The patient is concerned about recent weight gain. Did have lab band surgery a few years back. Initially did well. Now having issues with losing weight, despite limiting her calorie intake to 1500 calories and exercising more frequently. She has been on appetite suppressant in the past and did well and would like to try this again to see if this will help her start losing weight.  She is also following up regarding her anxiety and depression. Currently doing well on every day dose of lexapro and as needed alprazolam dose. She needs to have refills of these medications and other routine medications today.    Pt is here for routine follow up.    Current Medication: Outpatient Encounter Medications as of 05/17/2017  Medication Sig  . ALPRAZolam (XANAX) 1 MG tablet Take 1 tablet po TID prn anxiety  . azithromycin (ZITHROMAX) 250 MG tablet Take 2 tablets po today. Take 1 tablet po QD starting tomorrow through next 9 days.  . ciprofloxacin-hydrocortisone (CIPRO HC) OTIC suspension Place 3 drops into the left ear 2 (two) times daily. (Patient not taking: Reported on 02/12/2017)  . Clobetasol Prop Emollient Base 0.05 % emollient cream Apply 1 application topically 2 (two) times daily.  . ergocalciferol (VITAMIN D2) 50000 units capsule Take 1 capsule (50,000 Units total) by mouth once a week.  . escitalopram (LEXAPRO) 20 MG tablet Take 1.5tablets po QD  . neomycin-polymyxin-hydrocortisone (CORTISPORIN) OTIC solution Place 4 drops into both ears 3 (three) times daily.  Marland Kitchen omeprazole (PRILOSEC) 20 MG capsule Take 1 capsule (20 mg total) by mouth 2 (two) times daily.  . phentermine (ADIPEX-P) 37.5 MG tablet Take 1 tablet  (37.5 mg total) by mouth daily before breakfast.  . triamcinolone cream (KENALOG) 0.1 % Apply 1 application topically 2 (two) times daily.  . valACYclovir (VALTREX) 1000 MG tablet Take 1 tablet (1,000 mg total) by mouth daily.  . [DISCONTINUED] ALPRAZolam (XANAX) 1 MG tablet Take 1 tablet po Qam and Qpm. May take additional 1/2 tablet QHS prn  . [DISCONTINUED] escitalopram (LEXAPRO) 20 MG tablet Take 1.5tablets po QD  . [DISCONTINUED] omeprazole (PRILOSEC) 20 MG capsule Take 1 capsule (20 mg total) by mouth 2 (two) times daily.  . [DISCONTINUED] oseltamivir (TAMIFLU) 75 MG capsule Take 1 capsule (75 mg total) by mouth 2 (two) times daily. (Patient not taking: Reported on 05/17/2017)  . [DISCONTINUED] valACYclovir (VALTREX) 1000 MG tablet Take 1 tablet (1,000 mg total) by mouth daily.   No facility-administered encounter medications on file as of 05/17/2017.     Surgical History: Past Surgical History:  Procedure Laterality Date  . LAPAROSCOPIC GASTRIC SLEEVE RESECTION    . TONSILLECTOMY      Medical History: Past Medical History:  Diagnosis Date  . Anxiety   . Depression   . Sleep apnea     Family History: Family History  Problem Relation Age of Onset  . Heart attack Mother   . Diabetes Paternal Grandmother   . Diabetes Paternal Grandfather     Social History   Socioeconomic History  . Marital status: Single    Spouse name: Not on file  . Number of children: Not on file  . Years of education: Not  on file  . Highest education level: Not on file  Occupational History  . Not on file  Social Needs  . Financial resource strain: Not on file  . Food insecurity:    Worry: Not on file    Inability: Not on file  . Transportation needs:    Medical: Not on file    Non-medical: Not on file  Tobacco Use  . Smoking status: Current Every Day Smoker  . Smokeless tobacco: Never Used  Substance and Sexual Activity  . Alcohol use: Yes    Comment: occasionally  . Drug use: No  .  Sexual activity: Yes    Birth control/protection: None  Lifestyle  . Physical activity:    Days per week: Not on file    Minutes per session: Not on file  . Stress: Not on file  Relationships  . Social connections:    Talks on phone: Not on file    Gets together: Not on file    Attends religious service: Not on file    Active member of club or organization: Not on file    Attends meetings of clubs or organizations: Not on file    Relationship status: Not on file  . Intimate partner violence:    Fear of current or ex partner: Not on file    Emotionally abused: Not on file    Physically abused: Not on file    Forced sexual activity: Not on file  Other Topics Concern  . Not on file  Social History Narrative  . Not on file      Review of Systems  Constitutional: Negative for activity change, chills, fatigue and unexpected weight change.  HENT: Negative for congestion, postnasal drip, rhinorrhea, sinus pain, sore throat and voice change.   Eyes: Negative.   Respiratory: Negative for cough and wheezing.   Cardiovascular: Negative for chest pain and palpitations.  Gastrointestinal: Negative for abdominal pain, constipation, diarrhea, nausea and vomiting.  Endocrine: Negative.   Musculoskeletal: Negative.   Skin: Negative.   Allergic/Immunologic: Negative for environmental allergies.  Neurological: Positive for headaches.  Hematological: Negative for adenopathy.  Psychiatric/Behavioral: Positive for dysphoric mood. The patient is nervous/anxious.     Today's Vitals   05/17/17 1202  BP: 118/70  Pulse: (!) 55  Resp: 16  SpO2: 95%  Weight: 296 lb (134.3 kg)  Height: 5\' 9"  (1.753 m)    Physical Exam  Constitutional: She is oriented to person, place, and time. She appears well-developed and well-nourished.  HENT:  Head: Normocephalic and atraumatic.  Right Ear: Tympanic membrane and ear canal normal.  Left Ear: There is tenderness. Tympanic membrane is erythematous and  bulging.  Nose: Rhinorrhea present. Left sinus exhibits maxillary sinus tenderness.  Mouth/Throat: Uvula is midline, oropharynx is clear and moist and mucous membranes are normal.  Eyes: Pupils are equal, round, and reactive to light.  Neck: Normal range of motion. Neck supple. No thyromegaly present.  Cardiovascular: Normal rate, regular rhythm and normal heart sounds.  Pulmonary/Chest: Effort normal and breath sounds normal. She has no wheezes.  Abdominal: Soft. Bowel sounds are normal. There is no tenderness.  Musculoskeletal: Normal range of motion.  Lymphadenopathy:    She has no cervical adenopathy.  Neurological: She is alert and oriented to person, place, and time. She displays normal reflexes. No cranial nerve deficit. Coordination normal.  Skin: Skin is warm and dry.  Psychiatric: She has a normal mood and affect. Her behavior is normal. Judgment and thought content normal.  Nursing note and vitals reviewed.  Assessment/Plan: 1. Atopic dermatitis, unspecified type - Clobetasol Prop Emollient Base 0.05 % emollient cream; Apply 1 application topically 2 (two) times daily.  Dispense: 45 g; Refill: 3  2. Gastroesophageal reflux disease without esophagitis - omeprazole (PRILOSEC) 20 MG capsule; Take 1 capsule (20 mg total) by mouth 2 (two) times daily.  Dispense: 60 capsule; Refill: 3  3. Depression, major, single episode, moderate (HCC) - escitalopram (LEXAPRO) 20 MG tablet; Take 1.5tablets po QD  Dispense: 45 tablet; Refill: 3  4. GAD (generalized anxiety disorder) - ALPRAZolam (XANAX) 1 MG tablet; Take 1 tablet po TID prn anxiety  Dispense: 90 tablet; Refill: 3  5. Class 3 severe obesity due to excess calories without serious comorbidity with body mass index (BMI) of 40.0 to 44.9 in adult (HCC) - phentermine (ADIPEX-P) 37.5 MG tablet; Take 1 tablet (37.5 mg total) by mouth daily before breakfast.  Dispense: 30 tablet; Refill: 1  6. Herpes simplex disease - valACYclovir  (VALTREX) 1000 MG tablet; Take 1 tablet (1,000 mg total) by mouth daily.  Dispense: 30 tablet; Refill: 3  General Counseling: Erin Mcdonald verbalizes understanding of the findings of todays visit and agrees with plan of treatment. I have discussed any further diagnostic evaluation that may be needed or ordered today. We also reviewed her medications today. she has been encouraged to call the office with any questions or concerns that should arise related to todays visit.   There is a liability release in patients' chart. There has been a 10 minute discussion about the side effects including but not limited to elevated blood pressure, anxiety, lack of sleep and dry mouth. Pt understands and will like to start/continue on appetite suppressant at this time. There will be one month RX given at the time of visit with proper follow up. Nova diet plan with restricted calories is given to the pt. Pt understands and agrees with  plan of treatment  This patient was seen by Leretha Pol, FNP- C in Collaboration with Dr Lavera Guise as a part of collaborative care agreement   Meds ordered this encounter  Medications  . ALPRAZolam (XANAX) 1 MG tablet    Sig: Take 1 tablet po TID prn anxiety    Dispense:  90 tablet    Refill:  3    Order Specific Question:   Supervising Provider    Answer:   Lavera Guise [employer]  . escitalopram (LEXAPRO) 20 MG tablet    Sig: Take 1.5tablets po QD    Dispense:  45 tablet    Refill:  3    Order Specific Question:   Supervising Provider    Answer:   Lavera Guise [[id number]]  . omeprazole (PRILOSEC) 20 MG capsule    Sig: Take 1 capsule (20 mg total) by mouth 2 (two) times daily.    Dispense:  60 capsule    Refill:  3    Order Specific Question:   Supervising Provider    Answer:   Lavera Guise [[id number]]  . valACYclovir (VALTREX) 1000 MG tablet    Sig: Take 1 tablet (1,000 mg total) by mouth daily.    Dispense:  30 tablet    Refill:  3    Order Specific Question:   Supervising  Provider    Answer:   Lavera Guise [[id number]]  . Clobetasol Prop Emollient Base 0.05 % emollient cream    Sig: Apply 1 application topically 2 (two) times daily.    Dispense:  45 g    Refill:  3    Order Specific Question:   Supervising Provider    Answer:   Lavera Guise [[id number]]  . phentermine (ADIPEX-P) 37.5 MG tablet    Sig: Take 1 tablet (37.5 mg total) by mouth daily before breakfast.    Dispense:  30 tablet    Refill:  1    Order Specific Question:   Supervising Provider    Answer:   Lavera Guise [[id number]]    Time spent: 29 Minutes      Dr Lavera Guise Internal medicine

## 2017-06-05 ENCOUNTER — Telehealth: Payer: Self-pay | Admitting: Nurse Practitioner

## 2017-06-05 MED ORDER — FLUCONAZOLE 150 MG PO TABS: 150.0000 mg | ORAL_TABLET | Freq: Every day | ORAL | 1 refills | Status: DC

## 2017-06-05 NOTE — Telephone Encounter (Signed)
PT REQUESTING DIFLUCAN

## 2017-06-10 DIAGNOSIS — F321 Major depressive disorder, single episode, moderate: Secondary | ICD-10-CM | POA: Insufficient documentation

## 2017-06-10 DIAGNOSIS — L209 Atopic dermatitis, unspecified: Secondary | ICD-10-CM | POA: Insufficient documentation

## 2017-06-20 ENCOUNTER — Other Ambulatory Visit: Payer: Self-pay

## 2017-06-20 MED ORDER — ERGOCALCIFEROL 1.25 MG (50000 UT) PO CAPS: 50000.0000 [IU] | ORAL_CAPSULE | ORAL | 1 refills | Status: DC

## 2017-07-03 ENCOUNTER — Other Ambulatory Visit: Payer: Self-pay | Admitting: Nurse Practitioner

## 2017-07-03 DIAGNOSIS — H60503 Unspecified acute noninfective otitis externa, bilateral: Secondary | ICD-10-CM

## 2017-07-03 MED ORDER — NEOMYCIN-POLYMYXIN-HC 3.5-10000-1 OT SOLN: 4.0000 [drp] | Freq: Three times a day (TID) | OTIC | 0 refills | Status: DC

## 2017-07-10 ENCOUNTER — Other Ambulatory Visit: Payer: Self-pay | Admitting: Nurse Practitioner

## 2017-07-10 ENCOUNTER — Telehealth: Payer: Self-pay

## 2017-07-10 DIAGNOSIS — R42 Dizziness and giddiness: Secondary | ICD-10-CM

## 2017-07-10 MED ORDER — MECLIZINE HCL 25 MG PO TABS: 25.0000 mg | ORAL_TABLET | Freq: Three times a day (TID) | ORAL | 1 refills | Status: DC | PRN

## 2017-07-10 NOTE — Telephone Encounter (Signed)
-----   Message from [name], NP sent at 07/10/2017  8:55 AM EDT ----- Patient called office complaining of vertigo and unable to come in for visit. Sent in prescription for meclizine 25mg  which may be taken up to three times daily if needed for vertigo. Sent this to [facility].    ----- Message ----- From: Lou Cal, [place] Sent: 07/09/2017   8:34 AM To: [name], NP  Patient has vertigo and cant come in for appt can you please call something in for patient?? br

## 2017-07-10 NOTE — Telephone Encounter (Signed)
Patient has been advised that Erin Mcdonald Sent in prescription for meclizine 25mg  which may be taken up to three times daily if needed for vertigo. Sent this to [facility]. tat

## 2017-07-10 NOTE — Progress Notes (Signed)
Patient called office complaining of vertigo and unable to come in for visit. Sent in prescription for meclizine 25mg  which may be taken up to three times daily if needed for vertigo. Sent this to [facility].

## 2017-07-23 ENCOUNTER — Ambulatory Visit (INDEPENDENT_AMBULATORY_CARE_PROVIDER_SITE_OTHER): Payer: BLUE CROSS/BLUE SHIELD | Admitting: Obstetrics and Gynecology

## 2017-07-23 ENCOUNTER — Encounter: Payer: Self-pay | Admitting: Obstetrics and Gynecology

## 2017-07-23 VITALS — BP 118/80 | HR 66 | Ht 69.0 in | Wt 304.0 lb

## 2017-07-23 DIAGNOSIS — F17210 Nicotine dependence, cigarettes, uncomplicated: Secondary | ICD-10-CM | POA: Diagnosis not present

## 2017-07-23 DIAGNOSIS — N921 Excessive and frequent menstruation with irregular cycle: Secondary | ICD-10-CM

## 2017-07-23 DIAGNOSIS — Z1239 Encounter for other screening for malignant neoplasm of breast: Secondary | ICD-10-CM

## 2017-07-23 DIAGNOSIS — Z01411 Encounter for gynecological examination (general) (routine) with abnormal findings: Secondary | ICD-10-CM

## 2017-07-23 DIAGNOSIS — Z800 Family history of malignant neoplasm of digestive organs: Secondary | ICD-10-CM | POA: Diagnosis not present

## 2017-07-23 DIAGNOSIS — Z1231 Encounter for screening mammogram for malignant neoplasm of breast: Secondary | ICD-10-CM | POA: Diagnosis not present

## 2017-07-23 DIAGNOSIS — Z01419 Encounter for gynecological examination (general) (routine) without abnormal findings: Secondary | ICD-10-CM

## 2017-07-23 DIAGNOSIS — G4733 Obstructive sleep apnea (adult) (pediatric): Secondary | ICD-10-CM | POA: Insufficient documentation

## 2017-07-23 DIAGNOSIS — S60219A Contusion of unspecified wrist, initial encounter: Secondary | ICD-10-CM | POA: Insufficient documentation

## 2017-07-23 DIAGNOSIS — N920 Excessive and frequent menstruation with regular cycle: Secondary | ICD-10-CM | POA: Insufficient documentation

## 2017-07-23 NOTE — Progress Notes (Signed)
PCP:  [name], NP   Chief Complaint  Patient presents with  . Gynecologic Exam     HPI:      Ms. Erin Mcdonald is a 42 y.o. G0P0000 who LMP was Patient's last menstrual period was 06/24/2017 (exact date)., presents today for her annual examination.  Her menses are regular every 28-30 days, lasting 5-7 days, med to heavy flow, changing pads Q3-4 hrs, with dime to quarter sized clots.  Dysmenorrhea mild, occurring premenstrually and with menses, improved with ibup. She does not have intermenstrual bleeding. She may want BC for cycle control.   Sex activity: single partner, contraception - condoms   Last Pap: May 26, 2015  Results were: no abnormalities /neg HPV DNA  Hx of STDs: HSV, takes valtrex daily (Rx with PCP) Treated for yeast vag 2/19 with sx relief.  Last mammogram: September 05, 2016  Results were: normal--routine follow-up in 12 months There is no FH of breast cancer. There is no FH of ovarian cancer. The patient does do self-breast exams.  Tobacco use: a few cigs daily Alcohol use: social drinker No drug use.  Exercise: not active  She does get adequate calcium and Vitamin D in her diet. Labs with PCP.    Past Medical History:  Diagnosis Date  . Anxiety   . Depression   . Genital herpes   . Sleep apnea     Past Surgical History:  Procedure Laterality Date  . LAPAROSCOPIC GASTRIC SLEEVE RESECTION    . TONSILLECTOMY      Family History  Problem Relation Age of Onset  . Heart attack Mother   . Diabetes Paternal Grandmother   . Diabetes Paternal Grandfather   . Colon cancer Father 51    Social History   Socioeconomic History  . Marital status: Single    Spouse name: Not on file  . Number of children: Not on file  . Years of education: Not on file  . Highest education level: Not on file  Occupational History  . Not on file  Social Needs  . Financial resource strain: Not on file  . Food insecurity:    Worry: Not on file    Inability: Not on  file  . Transportation needs:    Medical: Not on file    Non-medical: Not on file  Tobacco Use  . Smoking status: Current Every Day Smoker  . Smokeless tobacco: Never Used  Substance and Sexual Activity  . Alcohol use: Yes    Comment: occasionally  . Drug use: No  . Sexual activity: Yes    Birth control/protection: None  Lifestyle  . Physical activity:    Days per week: Not on file    Minutes per session: Not on file  . Stress: Not on file  Relationships  . Social connections:    Talks on phone: Not on file    Gets together: Not on file    Attends religious service: Not on file    Active member of club or organization: Not on file    Attends meetings of clubs or organizations: Not on file    Relationship status: Not on file  . Intimate partner violence:    Fear of current or ex partner: Not on file    Emotionally abused: Not on file    Physically abused: Not on file    Forced sexual activity: Not on file  Other Topics Concern  . Not on file  Social History Narrative  . Not  on file    Outpatient Medications Prior to Visit  Medication Sig Dispense Refill  . ALPRAZolam (XANAX) 1 MG tablet Take 1 tablet po TID prn anxiety 90 tablet 3  . ergocalciferol (VITAMIN D2) 50000 units capsule Take 1 capsule (50,000 Units total) by mouth once a week. 4 capsule 1  . escitalopram (LEXAPRO) 20 MG tablet Take 1.5tablets po QD 45 tablet 3  . omeprazole (PRILOSEC) 20 MG capsule Take 1 capsule (20 mg total) by mouth 2 (two) times daily. 60 capsule 3  . valACYclovir (VALTREX) 1000 MG tablet Take 1 tablet (1,000 mg total) by mouth daily. 30 tablet 3  . Clobetasol Prop Emollient Base 0.05 % emollient cream Apply 1 application topically 2 (two) times daily. (Patient not taking: Reported on 07/23/2017) 45 g 3  . fluconazole (DIFLUCAN) 150 MG tablet Take 1 tablet (150 mg total) by mouth daily. (Patient not taking: Reported on 07/23/2017) 3 tablet 1  . meclizine (ANTIVERT) 25 MG tablet Take 1 tablet  (25 mg total) by mouth 3 (three) times daily as needed for dizziness. (Patient not taking: Reported on 07/23/2017) 30 tablet 1  . neomycin-polymyxin-hydrocortisone (CORTISPORIN) OTIC solution Place 4 drops into both ears 3 (three) times daily. (Patient not taking: Reported on 07/23/2017) 10 mL 0  . phentermine (ADIPEX-P) 37.5 MG tablet Take 1 tablet (37.5 mg total) by mouth daily before breakfast. (Patient not taking: Reported on 07/23/2017) 30 tablet 1  . triamcinolone cream (KENALOG) 0.1 % Apply 1 application topically 2 (two) times daily. (Patient not taking: Reported on 07/23/2017) 60 g 1  . azithromycin (ZITHROMAX) 250 MG tablet Take 2 tablets po today. Take 1 tablet po QD starting tomorrow through next 9 days. 11 tablet 0  . ciprofloxacin-hydrocortisone (CIPRO HC) OTIC suspension Place 3 drops into the left ear 2 (two) times daily. (Patient not taking: Reported on 02/12/2017) 10 mL 1   No facility-administered medications prior to visit.     ROS:  Review of Systems  Constitutional: Negative for fatigue, fever and unexpected weight change.  Respiratory: Negative for cough, shortness of breath and wheezing.   Cardiovascular: Negative for chest pain, palpitations and leg swelling.  Gastrointestinal: Negative for blood in stool, constipation, diarrhea, nausea and vomiting.  Endocrine: Negative for cold intolerance, heat intolerance and polyuria.  Genitourinary: Negative for dyspareunia, dysuria, flank pain, frequency, genital sores, hematuria, menstrual problem, pelvic pain, urgency, vaginal bleeding, vaginal discharge and vaginal pain.  Musculoskeletal: Negative for back pain, joint swelling and myalgias.  Skin: Negative for rash.  Neurological: Negative for dizziness, syncope, light-headedness, numbness and headaches.  Hematological: Negative for adenopathy.  Psychiatric/Behavioral: Negative for agitation, confusion, sleep disturbance and suicidal ideas. The patient is not nervous/anxious.     BREAST: No symptoms   Objective: BP 118/80   Pulse 66   Ht 5\' 9"  (1.753 m)   Wt (!) 304 lb (137.9 kg)   LMP 06/24/2017 (Exact Date)   BMI 44.89 kg/m    Physical Exam  Constitutional: She is oriented to person, place, and time. She appears well-developed and well-nourished.  Genitourinary: Vagina normal and uterus normal. There is no rash or tenderness on the right labia. There is no rash or tenderness on the left labia. No erythema or tenderness in the vagina. No vaginal discharge found. Right adnexum does not display mass and does not display tenderness. Left adnexum does not display mass and does not display tenderness. Cervix does not exhibit motion tenderness or polyp. Uterus is not enlarged or tender.  Neck: Normal range of motion. No thyromegaly present.  Cardiovascular: Normal rate, regular rhythm and normal heart sounds.  No murmur heard. Pulmonary/Chest: Effort normal and breath sounds normal. Right breast exhibits no mass, no nipple discharge, no skin change and no tenderness. Left breast exhibits no mass, no nipple discharge, no skin change and no tenderness.  Abdominal: Soft. There is no tenderness. There is no guarding.  Musculoskeletal: Normal range of motion.  Neurological: She is alert and oriented to person, place, and time. No cranial nerve deficit.  Psychiatric: She has a normal mood and affect. Her behavior is normal.  Vitals reviewed.   Assessment/Plan: Encounter for annual routine gynecological examination  Screening for breast cancer - Pt to sched mammo. - Plan: MM DIGITAL SCREENING BILATERAL  Menometrorrhagia - Prog only options discussed since uses tob. May want Mirena (nullip but better for sx). Handout given. RTO with menses if desires. Cytotec Rx prn/NSAIDs.  Family history of colon cancer - Doesn't qualify for genetic testing.            GYN counsel breast self exam, mammography screening, family planning choices, adequate intake of calcium and  vitamin D, diet and exercise     F/U  Return in about 1 year (around 07/24/2018).  Alicia B. Copland, PA-C 07/23/2017 2:46 PM

## 2017-07-23 NOTE — Patient Instructions (Addendum)
I value your feedback and entrusting us with your care. If you get a [facility] patient survey, I would appreciate you taking the time to let us know about your experience today. Thank you!  [facility]: [phone]    

## 2017-08-07 ENCOUNTER — Telehealth: Payer: Self-pay

## 2017-08-07 NOTE — Telephone Encounter (Signed)
Pt called triage line stating she has been missing work d/t her heavy cycles and feeling crappy. She wants [facility] to call her and discuss.   Pt was recently seen on 7/15 by [facility].

## 2017-08-07 NOTE — Telephone Encounter (Signed)
Spoke with pt. Menses are heavy but also has severe fatigue wk before menses and has had to miss work. Pt wondering about a note "on file" for work re: bad periods/absences. Discussed need for tx options. Pt needs to talk with work re: FMLA vs note. Can't just give note for any absences.  Pt smokes a few cigs daily and shouldn't have estrogen. Discussed prog only options and ablation. Pt interested in IUD but aware it won't help fatigue sx. Pt to consider options and f/u.

## 2017-08-07 NOTE — Telephone Encounter (Signed)
LMTRC

## 2017-08-30 ENCOUNTER — Encounter: Payer: Self-pay | Admitting: Nurse Practitioner

## 2017-08-30 ENCOUNTER — Ambulatory Visit: Payer: Self-pay | Admitting: Nurse Practitioner

## 2017-08-30 VITALS — BP 115/72 | HR 72 | Resp 16 | Ht 69.0 in | Wt 307.4 lb

## 2017-08-30 DIAGNOSIS — F321 Major depressive disorder, single episode, moderate: Secondary | ICD-10-CM

## 2017-08-30 DIAGNOSIS — R635 Abnormal weight gain: Secondary | ICD-10-CM

## 2017-08-30 DIAGNOSIS — Z1239 Encounter for other screening for malignant neoplasm of breast: Secondary | ICD-10-CM

## 2017-08-30 DIAGNOSIS — Z1231 Encounter for screening mammogram for malignant neoplasm of breast: Secondary | ICD-10-CM

## 2017-08-30 DIAGNOSIS — E559 Vitamin D deficiency, unspecified: Secondary | ICD-10-CM

## 2017-08-30 DIAGNOSIS — K219 Gastro-esophageal reflux disease without esophagitis: Secondary | ICD-10-CM | POA: Diagnosis not present

## 2017-08-30 DIAGNOSIS — R5383 Other fatigue: Secondary | ICD-10-CM | POA: Diagnosis not present

## 2017-08-30 DIAGNOSIS — R7301 Impaired fasting glucose: Secondary | ICD-10-CM

## 2017-08-30 DIAGNOSIS — F411 Generalized anxiety disorder: Secondary | ICD-10-CM

## 2017-08-30 DIAGNOSIS — B009 Herpesviral infection, unspecified: Secondary | ICD-10-CM

## 2017-08-30 MED ORDER — ESCITALOPRAM OXALATE 20 MG PO TABS: ORAL_TABLET | ORAL | 3 refills | Status: DC

## 2017-08-30 MED ORDER — ALPRAZOLAM 1 MG PO TABS: ORAL_TABLET | ORAL | 3 refills | Status: DC

## 2017-08-30 MED ORDER — OMEPRAZOLE 20 MG PO CPDR: 20.0000 mg | DELAYED_RELEASE_CAPSULE | Freq: Two times a day (BID) | ORAL | 3 refills | Status: DC

## 2017-08-30 MED ORDER — VALACYCLOVIR HCL 1 G PO TABS: 1000.0000 mg | ORAL_TABLET | Freq: Every day | ORAL | 3 refills | Status: DC

## 2017-08-30 NOTE — Progress Notes (Signed)
[facility] [place], [place] [address]  Internal MEDICINE  Office Visit Note  Patient Name: Erin Mcdonald  [id number]  [id number]  Date of Service: 09/10/2017  Chief Complaint  Patient presents with  . Fatigue  . Anxiety    The patient reports moderate fatigue. Interfering with normal activities. Does see her bariatric surgeon. Had weight loss surgery a few years ago. Her surgeon is trying to get a new prescription approved for her to help her with weight loss and improve energy levels.  She does have ongoing anxiety. Currently, her symptoms are well managed with the medications she is taking. She needs to have refills for these today.       Current Medication: Outpatient Encounter Medications as of 08/30/2017  Medication Sig  . ALPRAZolam (XANAX) 1 MG tablet Take 1 tablet po TID prn anxiety  . escitalopram (LEXAPRO) 20 MG tablet Take 1.5tablets po QD  . omeprazole (PRILOSEC) 20 MG capsule Take 1 capsule (20 mg total) by mouth 2 (two) times daily.  . valACYclovir (VALTREX) 1000 MG tablet Take 1 tablet (1,000 mg total) by mouth daily.  . [DISCONTINUED] ALPRAZolam (XANAX) 1 MG tablet Take 1 tablet po TID prn anxiety  . [DISCONTINUED] ergocalciferol (VITAMIN D2) 50000 units capsule Take 1 capsule (50,000 Units total) by mouth once a week.  . [DISCONTINUED] escitalopram (LEXAPRO) 20 MG tablet Take 1.5tablets po QD  . [DISCONTINUED] omeprazole (PRILOSEC) 20 MG capsule Take 1 capsule (20 mg total) by mouth 2 (two) times daily.  . [DISCONTINUED] valACYclovir (VALTREX) 1000 MG tablet Take 1 tablet (1,000 mg total) by mouth daily.  . Clobetasol Prop Emollient Base 0.05 % emollient cream Apply 1 application topically 2 (two) times daily. (Patient not taking: Reported on 07/23/2017)  . fluconazole (DIFLUCAN) 150 MG tablet Take 1 tablet (150 mg total) by mouth daily. (Patient not taking: Reported on 07/23/2017)  . meclizine (ANTIVERT) 25 MG tablet Take 1 tablet (25 mg total)  by mouth 3 (three) times daily as needed for dizziness. (Patient not taking: Reported on 07/23/2017)  . neomycin-polymyxin-hydrocortisone (CORTISPORIN) OTIC solution Place 4 drops into both ears 3 (three) times daily. (Patient not taking: Reported on 07/23/2017)  . phentermine (ADIPEX-P) 37.5 MG tablet Take 1 tablet (37.5 mg total) by mouth daily before breakfast. (Patient not taking: Reported on 07/23/2017)  . triamcinolone cream (KENALOG) 0.1 % Apply 1 application topically 2 (two) times daily. (Patient not taking: Reported on 07/23/2017)   No facility-administered encounter medications on file as of 08/30/2017.     Surgical History: Past Surgical History:  Procedure Laterality Date  . LAPAROSCOPIC GASTRIC SLEEVE RESECTION    . TONSILLECTOMY      Medical History: Past Medical History:  Diagnosis Date  . Anxiety   . Depression   . Genital herpes   . Sleep apnea     Family History: Family History  Problem Relation Age of Onset  . Heart attack Mother   . Diabetes Paternal Grandmother   . Diabetes Paternal Grandfather   . Colon cancer Father 52    Social History   Socioeconomic History  . Marital status: Single    Spouse name: Not on file  . Number of children: Not on file  . Years of education: Not on file  . Highest education level: Not on file  Occupational History  . Not on file  Social Needs  . Financial resource strain: Not on file  . Food insecurity:    Worry: Not on file  Inability: Not on file  . Transportation needs:    Medical: Not on file    Non-medical: Not on file  Tobacco Use  . Smoking status: Current Every Day Smoker  . Smokeless tobacco: Never Used  Substance and Sexual Activity  . Alcohol use: Yes    Comment: occasionally  . Drug use: No  . Sexual activity: Yes    Birth control/protection: None  Lifestyle  . Physical activity:    Days per week: Not on file    Minutes per session: Not on file  . Stress: Not on file  Relationships  .  Social connections:    Talks on phone: Not on file    Gets together: Not on file    Attends religious service: Not on file    Active member of club or organization: Not on file    Attends meetings of clubs or organizations: Not on file    Relationship status: Not on file  . Intimate partner violence:    Fear of current or ex partner: Not on file    Emotionally abused: Not on file    Physically abused: Not on file    Forced sexual activity: Not on file  Other Topics Concern  . Not on file  Social History Narrative  . Not on file      Review of Systems  Constitutional: Positive for activity change and fatigue. Negative for chills and unexpected weight change.  HENT: Negative for congestion, postnasal drip, rhinorrhea, sinus pain, sore throat and voice change.   Eyes: Negative.   Respiratory: Negative for cough and wheezing.   Cardiovascular: Negative for chest pain and palpitations.  Gastrointestinal: Negative for abdominal pain, constipation, diarrhea, nausea and vomiting.  Endocrine: Negative for cold intolerance, heat intolerance, polydipsia, polyphagia and polyuria.  Genitourinary: Negative.   Musculoskeletal: Negative for arthralgias, back pain and myalgias.  Skin: Negative for rash.  Allergic/Immunologic: Negative for environmental allergies.  Neurological: Positive for headaches.  Hematological: Negative for adenopathy.  Psychiatric/Behavioral: Positive for dysphoric mood. The patient is nervous/anxious.     Today's Vitals   08/30/17 1605  BP: 115/72  Pulse: 72  Resp: 16  SpO2: 96%  Weight: (!) 307 lb 6.4 oz (139.4 kg)  Height: 5\' 9"  (1.753 m)    Physical Exam  Constitutional: She is oriented to person, place, and time. She appears well-developed and well-nourished.  HENT:  Head: Normocephalic and atraumatic.  Right Ear: Tympanic membrane and ear canal normal.  Left Ear: Tympanic membrane and ear canal normal. No tenderness. Tympanic membrane is not  erythematous and not bulging.  Nose: Nose normal. No rhinorrhea. Left sinus exhibits no maxillary sinus tenderness.  Mouth/Throat: Uvula is midline, oropharynx is clear and moist and mucous membranes are normal.  Eyes: Pupils are equal, round, and reactive to light. Conjunctivae and EOM are normal.  Neck: Normal range of motion. Neck supple. No thyromegaly present.  Cardiovascular: Normal rate, regular rhythm and normal heart sounds.  Pulmonary/Chest: Effort normal and breath sounds normal. She has no wheezes.  Abdominal: Soft. Bowel sounds are normal. There is no tenderness.  Musculoskeletal: Normal range of motion.  Lymphadenopathy:    She has no cervical adenopathy.  Neurological: She is alert and oriented to person, place, and time. She displays normal reflexes. No cranial nerve deficit. Coordination normal.  Skin: Skin is warm and dry. Capillary refill takes less than 2 seconds.  Psychiatric: Her speech is normal and behavior is normal. Judgment and thought content normal. Her mood  appears anxious. Cognition and memory are normal. She exhibits a depressed mood.  Nursing note and vitals reviewed.  Assessment/Plan: 1. Other fatigue Will check labs including full thyroid and anemia panels. Treat as indicated.  - CBC with Differential/Platelet - T4, free - TSH - Iron, TIBC and Ferritin Panel - Vitamin B12  2. GAD (generalized anxiety disorder) May continue alprazolam 1mg  up to three times daily if needed for acute anxiety. New prescription sent to her pharmacy today.  - ALPRAZolam (XANAX) 1 MG tablet; Take 1 tablet po TID prn anxiety  Dispense: 90 tablet; Refill: 3  3. Depression, major, single episode, moderate ([place]) Doing well. Continue lexapro 20mg  tablets, taking 1.5 tablets daily.  - escitalopram (LEXAPRO) 20 MG tablet; Take 1.5tablets po QD  Dispense: 45 tablet; Refill: 3  4. Gastroesophageal reflux disease without esophagitis - omeprazole (PRILOSEC) 20 MG capsule; Take 1  capsule (20 mg total) by mouth 2 (two) times daily.  Dispense: 60 capsule; Refill: 3  5. Herpes simplex disease - valACYclovir (VALTREX) 1000 MG tablet; Take 1 tablet (1,000 mg total) by mouth daily.  Dispense: 30 tablet; Refill: 3  6. Vitamin D deficiency - Vitamin D 1,25 dihydroxy  7. Impaired fasting glucose - Comprehensive metabolic panel - Hemoglobin A1c  8. Abnormal weight gain - T4, free - TSH - Lipid panel  9. Screening for breast cancer - MM DIGITAL SCREENING BILATERAL; Future  General Counseling: Temesha verbalizes understanding of the findings of todays visit and agrees with plan of treatment. I have discussed any further diagnostic evaluation that may be needed or ordered today. We also reviewed her medications today. she has been encouraged to call the office with any questions or concerns that should arise related to todays visit.  Obesity Counseling: Risk Assessment: An assessment of behavioral risk factors was made today and includes lack of exercise sedentary lifestyle, lack of portion control and poor dietary habits. Risk Modification Advice: She was counseled on portion control guidelines. Restricting daily caloric intake to 1500. The detrimental long term effects of obesity on her health and ongoing poor compliance was also discussed with the patient.  This patient was seen by Leretha Pol FNP Collaboration with Dr Lavera Guise as a part of collaborative care agreement  Orders Placed This Encounter  Procedures  . MM DIGITAL SCREENING BILATERAL  . CBC with Differential/Platelet  . Comprehensive metabolic panel  . T4, free  . TSH  . Lipid panel  . Vitamin D 1,25 dihydroxy  . Iron, TIBC and Ferritin Panel  . Vitamin B12  . Hemoglobin A1c    Meds ordered this encounter  Medications  . escitalopram (LEXAPRO) 20 MG tablet    Sig: Take 1.5tablets po QD    Dispense:  45 tablet    Refill:  3    Order Specific Question:   Supervising Provider    Answer:    Lavera Guise [[id number]]  . ALPRAZolam (XANAX) 1 MG tablet    Sig: Take 1 tablet po TID prn anxiety    Dispense:  90 tablet    Refill:  3    Order Specific Question:   Supervising Provider    Answer:   Lavera Guise [place]  . omeprazole (PRILOSEC) 20 MG capsule    Sig: Take 1 capsule (20 mg total) by mouth 2 (two) times daily.    Dispense:  60 capsule    Refill:  3    Order Specific Question:   Supervising Provider    Answer:  KHAN, FOZIA M [[id number]]  . valACYclovir (VALTREX) 1000 MG tablet    Sig: Take 1 tablet (1,000 mg total) by mouth daily.    Dispense:  30 tablet    Refill:  3    Order Specific Question:   Supervising Provider    Answer:   Lavera Guise [[id number]]    Time spent: 2 Minutes      Dr Lavera Guise Internal medicine

## 2017-09-05 ENCOUNTER — Other Ambulatory Visit: Payer: Self-pay | Admitting: Nurse Practitioner

## 2017-09-05 MED ORDER — ERGOCALCIFEROL 1.25 MG (50000 UT) PO CAPS: 50000.0000 [IU] | ORAL_CAPSULE | ORAL | 1 refills | Status: DC

## 2017-09-10 DIAGNOSIS — E559 Vitamin D deficiency, unspecified: Secondary | ICD-10-CM | POA: Insufficient documentation

## 2017-09-10 DIAGNOSIS — R5383 Other fatigue: Secondary | ICD-10-CM | POA: Insufficient documentation

## 2017-09-10 DIAGNOSIS — R635 Abnormal weight gain: Secondary | ICD-10-CM | POA: Insufficient documentation

## 2017-09-10 DIAGNOSIS — R7301 Impaired fasting glucose: Secondary | ICD-10-CM | POA: Insufficient documentation

## 2017-09-12 ENCOUNTER — Encounter: Payer: Self-pay | Admitting: Obstetrics and Gynecology

## 2017-09-18 ENCOUNTER — Ambulatory Visit: Payer: Self-pay | Admitting: Adult Health

## 2017-09-24 ENCOUNTER — Other Ambulatory Visit
Admission: RE | Admit: 2017-09-24 | Discharge: 2017-09-24 | Disposition: A | Discharge status: Home / Self Care | Payer: BLUE CROSS/BLUE SHIELD | Source: Ambulatory Visit | Attending: Nurse Practitioner | Admitting: Nurse Practitioner

## 2017-09-24 ENCOUNTER — Ambulatory Visit
Admission: RE | Admit: 2017-09-24 | Discharge: 2017-09-24 | Disposition: A | Discharge status: Home / Self Care | Payer: BLUE CROSS/BLUE SHIELD | Source: Ambulatory Visit | Attending: Nurse Practitioner | Admitting: Nurse Practitioner

## 2017-09-24 DIAGNOSIS — Z1239 Encounter for other screening for malignant neoplasm of breast: Secondary | ICD-10-CM

## 2017-09-24 DIAGNOSIS — Z1231 Encounter for screening mammogram for malignant neoplasm of breast: Secondary | ICD-10-CM | POA: Diagnosis not present

## 2017-09-24 LAB — COMPREHENSIVE METABOLIC PANEL
ALBUMIN: 4.1 g/dL (ref 3.5–5.0)
ALT: 32 U/L (ref 0–44)
ANION GAP: 7 (ref 5–15)
AST: 26 U/L (ref 15–41)
Alkaline Phosphatase: 124 U/L (ref 38–126)
BUN: 13 mg/dL (ref 6–20)
CO2: 23 mmol/L (ref 22–32)
Calcium: 9.2 mg/dL (ref 8.9–10.3)
Chloride: 105 mmol/L (ref 98–111)
Creatinine, Ser: 0.68 mg/dL (ref 0.44–1.00)
GFR calc Af Amer: >60 mL/min (ref >60)
GFR calc non Af Amer: >60 mL/min (ref >60)
GLUCOSE: 105 mg/dL — AB (ref 70–99)
POTASSIUM: 4.1 mmol/L (ref 3.5–5.1)
SODIUM: 135 mmol/L (ref 135–145)
TOTAL PROTEIN: 7.4 g/dL (ref 6.5–8.1)
Total Bilirubin: 0.2 mg/dL — ABNORMAL LOW (ref 0.3–1.2)

## 2017-09-24 LAB — TSH: TSH: 2.459 u[IU]/mL (ref 0.350–4.500)

## 2017-09-24 LAB — CBC
HEMATOCRIT: 40.4 % (ref 35.0–47.0)
Hemoglobin: 13.3 g/dL (ref 12.0–16.0)
MCH: 29.7 pg (ref 26.0–34.0)
MCHC: 32.9 g/dL (ref 32.0–36.0)
MCV: 90.1 fL (ref 80.0–100.0)
Platelets: 351 10*3/uL (ref 150–440)
RBC: 4.49 MIL/uL (ref 3.80–5.20)
RDW: 14.9 % — AB (ref 11.5–14.5)
WBC: 11.8 10*3/uL — ABNORMAL HIGH (ref 3.6–11.0)

## 2017-09-24 LAB — T4, FREE: Free T4: 0.82 ng/dL (ref 0.82–1.77)

## 2017-09-24 LAB — FOLATE: FOLATE: 3.8 ng/mL — AB (ref >5.9)

## 2017-09-24 LAB — VITAMIN B12: VITAMIN B 12: 153 pg/mL — AB (ref 180–914)

## 2017-09-24 LAB — FERRITIN: Ferritin: 13 ng/mL (ref 11–307)

## 2017-09-25 ENCOUNTER — Other Ambulatory Visit: Payer: Self-pay | Admitting: Nurse Practitioner

## 2017-09-25 DIAGNOSIS — R928 Other abnormal and inconclusive findings on diagnostic imaging of breast: Secondary | ICD-10-CM

## 2017-09-25 DIAGNOSIS — N6310 Unspecified lump in the right breast, unspecified quadrant: Secondary | ICD-10-CM

## 2017-09-25 LAB — HEMOGLOBIN A1C
Hgb A1c MFr Bld: 5.7 % — ABNORMAL HIGH (ref 4.8–5.6)
Mean Plasma Glucose: 117 mg/dL

## 2017-10-03 ENCOUNTER — Ambulatory Visit
Admission: RE | Admit: 2017-10-03 | Discharge: 2017-10-03 | Disposition: A | Discharge status: Home / Self Care | Payer: BLUE CROSS/BLUE SHIELD | Source: Ambulatory Visit | Attending: Nurse Practitioner | Admitting: Nurse Practitioner

## 2017-10-03 DIAGNOSIS — R928 Other abnormal and inconclusive findings on diagnostic imaging of breast: Secondary | ICD-10-CM

## 2017-10-03 DIAGNOSIS — N6310 Unspecified lump in the right breast, unspecified quadrant: Secondary | ICD-10-CM | POA: Insufficient documentation

## 2017-10-03 DIAGNOSIS — N6311 Unspecified lump in the right breast, upper outer quadrant: Secondary | ICD-10-CM | POA: Diagnosis not present

## 2017-10-24 ENCOUNTER — Telehealth: Payer: Self-pay

## 2017-10-24 NOTE — Telephone Encounter (Signed)
Pt advised labs showed b12 defciency need to start b12 injection and se other note

## 2017-10-25 ENCOUNTER — Other Ambulatory Visit: Payer: Self-pay | Admitting: Nurse Practitioner

## 2017-10-25 ENCOUNTER — Telehealth: Payer: Self-pay

## 2017-10-25 ENCOUNTER — Ambulatory Visit: Payer: Self-pay

## 2017-10-25 DIAGNOSIS — E538 Deficiency of other specified B group vitamins: Secondary | ICD-10-CM

## 2017-10-25 MED ORDER — CYANOCOBALAMIN 1000 MCG/ML IJ SOLN: INTRAMUSCULAR | 5 refills | Status: DC

## 2017-10-25 MED ORDER — "SYRINGE 25G X 1"" 3 ML MISC": 5 refills | Status: DC

## 2017-10-25 NOTE — Progress Notes (Signed)
Sent in prescription for b12 with needles and syringes to [facility]. She should inject 67ml once weekly for 6 weeks, then inject 86ml once monthly after that. She should also take OTC folic acid supplement 1mg  daily.

## 2017-10-25 NOTE — Progress Notes (Signed)
Sent in prescription for b12 with needles and syringes to [facility]. She should inject 76ml once weekly for 6 weeks, then inject 26ml once monthly after that. She should also take OTC folic acid supplement 1mg  daily.

## 2017-10-25 NOTE — Telephone Encounter (Signed)
Pt advised b12 injections send to [facility] and also pt want first shots at office and other at home

## 2017-11-07 ENCOUNTER — Encounter: Payer: Self-pay | Admitting: Nurse Practitioner

## 2017-11-07 ENCOUNTER — Encounter: Payer: Self-pay | Admitting: Obstetrics and Gynecology

## 2017-11-23 LAB — HEMOGLOBIN A1C
Hgb A1c MFr Bld: 5.7 % — ABNORMAL HIGH (ref 4.8–5.6)
Mean Plasma Glucose: 117 mg/dL

## 2017-11-23 LAB — VITAMIN D 25 HYDROXY (VIT D DEFICIENCY, FRACTURES): Vit D, 25-Hydroxy: 49.9 ng/mL (ref 30.0–100.0)

## 2017-11-29 ENCOUNTER — Ambulatory Visit: Payer: Self-pay | Admitting: Nurse Practitioner

## 2017-12-13 ENCOUNTER — Ambulatory Visit: Payer: Self-pay | Admitting: Nurse Practitioner

## 2017-12-21 ENCOUNTER — Ambulatory Visit: Payer: Self-pay | Admitting: Nurse Practitioner

## 2017-12-21 ENCOUNTER — Encounter: Payer: Self-pay | Admitting: Nurse Practitioner

## 2017-12-21 VITALS — BP 121/67 | HR 73 | Resp 16 | Ht 69.0 in | Wt 301.0 lb

## 2017-12-21 DIAGNOSIS — D509 Iron deficiency anemia, unspecified: Secondary | ICD-10-CM | POA: Diagnosis not present

## 2017-12-21 DIAGNOSIS — B009 Herpesviral infection, unspecified: Secondary | ICD-10-CM

## 2017-12-21 DIAGNOSIS — F411 Generalized anxiety disorder: Secondary | ICD-10-CM | POA: Diagnosis not present

## 2017-12-21 DIAGNOSIS — F321 Major depressive disorder, single episode, moderate: Secondary | ICD-10-CM | POA: Diagnosis not present

## 2017-12-21 MED ORDER — FERRALET 90 90-1 MG PO TABS: 1.0000 | ORAL_TABLET | Freq: Every day | ORAL | 3 refills | Status: DC

## 2017-12-21 MED ORDER — ESCITALOPRAM OXALATE 20 MG PO TABS: ORAL_TABLET | ORAL | 3 refills | Status: DC

## 2017-12-21 MED ORDER — VALACYCLOVIR HCL 1 G PO TABS: 1000.0000 mg | ORAL_TABLET | Freq: Every day | ORAL | 3 refills | Status: DC

## 2017-12-21 MED ORDER — ALPRAZOLAM 1 MG PO TABS: ORAL_TABLET | ORAL | 3 refills | Status: DC

## 2017-12-21 NOTE — Progress Notes (Signed)
[facility] [place], [place] [address]  Internal MEDICINE  Office Visit Note  Patient Name: Erin Mcdonald  [id number]  [id number]  Date of Service: 12/23/2017  Chief Complaint  Patient presents with  . Medical Management of Chronic Issues    3 month follow up  . Quality Metric Gaps    pt will get the flu vaccine from pharmacy    The patient is here for routine follow up visit. She is treated for chronic anxiety and depression and is doing well with her current medications. She takes lexapro 20mg  daily and takes alprazolam 1mg  up to three times daily when needed for acute anxiety. These medications keep her anxiety and depression well managed and enables her to participate in routine, daily activities. She needs to have refills for these medications today.  She is due to have her flu shot and will get this at her local pharmacy.      Current Medication: Outpatient Encounter Medications as of 12/21/2017  Medication Sig  . ALPRAZolam (XANAX) 1 MG tablet Take 1 tablet po TID prn anxiety  . cyanocobalamin (,VITAMIN B-12,) 1000 MCG/ML injection Inject 78ml IM once weekly for 6 weeks then inject 72ml IM monthly  . ergocalciferol (VITAMIN D2) 50000 units capsule Take 1 capsule (50,000 Units total) by mouth once a week.  . escitalopram (LEXAPRO) 20 MG tablet Take 1.5tablets po QD  . omeprazole (PRILOSEC) 20 MG capsule Take 1 capsule (20 mg total) by mouth 2 (two) times daily.  . Syringe/Needle, Disp, (SYRINGE 3CC/25GX1") 25G X 1" 3 ML MISC Use for IM b12 injections.  . valACYclovir (VALTREX) 1000 MG tablet Take 1 tablet (1,000 mg total) by mouth daily.  . [DISCONTINUED] ALPRAZolam (XANAX) 1 MG tablet Take 1 tablet po TID prn anxiety  . [DISCONTINUED] escitalopram (LEXAPRO) 20 MG tablet Take 1.5tablets po QD  . [DISCONTINUED] valACYclovir (VALTREX) 1000 MG tablet Take 1 tablet (1,000 mg total) by mouth daily.  . Clobetasol Prop Emollient Base 0.05 % emollient cream  Apply 1 application topically 2 (two) times daily. (Patient not taking: Reported on 07/23/2017)  . Fe Cbn-Fe Gluc-FA-B12-C-DSS (FERRALET 90) 90-1 MG TABS Take 1 tablet by mouth daily.  . fluconazole (DIFLUCAN) 150 MG tablet Take 1 tablet (150 mg total) by mouth daily. (Patient not taking: Reported on 07/23/2017)  . meclizine (ANTIVERT) 25 MG tablet Take 1 tablet (25 mg total) by mouth 3 (three) times daily as needed for dizziness. (Patient not taking: Reported on 07/23/2017)  . neomycin-polymyxin-hydrocortisone (CORTISPORIN) OTIC solution Place 4 drops into both ears 3 (three) times daily. (Patient not taking: Reported on 07/23/2017)  . phentermine (ADIPEX-P) 37.5 MG tablet Take 1 tablet (37.5 mg total) by mouth daily before breakfast. (Patient not taking: Reported on 07/23/2017)  . triamcinolone cream (KENALOG) 0.1 % Apply 1 application topically 2 (two) times daily. (Patient not taking: Reported on 07/23/2017)   No facility-administered encounter medications on file as of 12/21/2017.     Surgical History: Past Surgical History:  Procedure Laterality Date  . LAPAROSCOPIC GASTRIC SLEEVE RESECTION    . TONSILLECTOMY      Medical History: Past Medical History:  Diagnosis Date  . Anxiety   . Depression   . Genital herpes   . Sleep apnea     Family History: Family History  Problem Relation Age of Onset  . Heart attack Mother   . Diabetes Paternal Grandmother   . Diabetes Paternal Grandfather   . Colon cancer Father 27  Social History   Socioeconomic History  . Marital status: Single    Spouse name: Not on file  . Number of children: Not on file  . Years of education: Not on file  . Highest education level: Not on file  Occupational History  . Not on file  Social Needs  . Financial resource strain: Not on file  . Food insecurity:    Worry: Not on file    Inability: Not on file  . Transportation needs:    Medical: Not on file    Non-medical: Not on file  Tobacco Use  .  Smoking status: Current Every Day Smoker  . Smokeless tobacco: Never Used  Substance and Sexual Activity  . Alcohol use: Yes    Comment: occasionally  . Drug use: No  . Sexual activity: Yes    Birth control/protection: None  Lifestyle  . Physical activity:    Days per week: Not on file    Minutes per session: Not on file  . Stress: Not on file  Relationships  . Social connections:    Talks on phone: Not on file    Gets together: Not on file    Attends religious service: Not on file    Active member of club or organization: Not on file    Attends meetings of clubs or organizations: Not on file    Relationship status: Not on file  . Intimate partner violence:    Fear of current or ex partner: Not on file    Emotionally abused: Not on file    Physically abused: Not on file    Forced sexual activity: Not on file  Other Topics Concern  . Not on file  Social History Narrative  . Not on file      Review of Systems  Constitutional: Negative for activity change, chills, fatigue and unexpected weight change.  HENT: Negative for congestion, postnasal drip, rhinorrhea, sinus pain, sore throat and voice change.   Eyes: Negative.   Respiratory: Negative for cough and wheezing.   Cardiovascular: Negative for chest pain and palpitations.  Gastrointestinal: Negative for abdominal pain, constipation, diarrhea, nausea and vomiting.  Endocrine: Negative for cold intolerance, heat intolerance, polydipsia, polyphagia and polyuria.  Genitourinary: Negative.   Musculoskeletal: Negative for arthralgias, back pain and myalgias.  Skin: Negative for rash.  Allergic/Immunologic: Negative for environmental allergies.  Neurological: Positive for headaches. Negative for dizziness.  Hematological: Negative for adenopathy.  Psychiatric/Behavioral: Positive for dysphoric mood. The patient is nervous/anxious.     Today's Vitals   12/21/17 1452  BP: 121/67  Pulse: 73  Resp: 16  SpO2: 96%  Weight:  (!) 301 lb (136.5 kg)  Height: 5\' 9"  (1.753 m)    Physical Exam Vitals signs and nursing note reviewed.  Constitutional:      General: She is not in acute distress.    Appearance: She is well-developed. She is not diaphoretic.  HENT:     Head: Normocephalic and atraumatic.     Nose: Nose normal.     Mouth/Throat:     Mouth: Mucous membranes are moist.     Pharynx: No oropharyngeal exudate.  Eyes:     Extraocular Movements: Extraocular movements intact.     Pupils: Pupils are equal, round, and reactive to light.  Neck:     Musculoskeletal: Normal range of motion and neck supple.     Thyroid: No thyromegaly.     Vascular: No JVD.     Trachea: No tracheal deviation.  Cardiovascular:  Rate and Rhythm: Normal rate and regular rhythm.     Heart sounds: Normal heart sounds. No murmur. No friction rub. No gallop.   Pulmonary:     Effort: Pulmonary effort is normal. No respiratory distress.     Breath sounds: Normal breath sounds. No wheezing or rales.  Chest:     Chest wall: No tenderness.  Abdominal:     General: Bowel sounds are normal.     Palpations: Abdomen is soft.  Musculoskeletal: Normal range of motion.  Lymphadenopathy:     Cervical: No cervical adenopathy.  Skin:    General: Skin is warm and dry.  Neurological:     General: No focal deficit present.     Mental Status: She is alert and oriented to person, place, and time.     Cranial Nerves: No cranial nerve deficit.  Psychiatric:        Mood and Affect: Mood normal.        Behavior: Behavior normal.        Thought Content: Thought content normal.        Judgment: Judgment normal.   Assessment/Plan: 1. Iron deficiency anemia, unspecified iron deficiency anemia type Reviewed labs which do show persistent iron deficiency anemia. Recommend she take ferralet daily. New prescription sent to pharmacy.  Patient given manufacturer copay card to help with expense.  - Fe Cbn-Fe Gluc-FA-B12-C-DSS (FERRALET 90) 90-1 MG  TABS; Take 1 tablet by mouth daily.  Dispense: 30 each; Refill: 3  2. GAD (generalized anxiety disorder) May continue alprazolam 1mg  up to three times daily as needed for acute anxiety. New prescription sent to her pharmacy.  - ALPRAZolam Duanne Moron) 1 MG tablet; Take 1 tablet po TID prn anxiety  Dispense: 90 tablet; Refill: 3  3. Depression, major, single episode, moderate (HCC) Continue lexapro 20mg  daily. New prescription sent to her pharmacy.  - escitalopram (LEXAPRO) 20 MG tablet; Take 1.5tablets po QD  Dispense: 45 tablet; Refill: 3  4. Herpes simplex disease - valACYclovir (VALTREX) 1000 MG tablet; Take 1 tablet (1,000 mg total) by mouth daily.  Dispense: 30 tablet; Refill: 3  General Counseling: Laurina verbalizes understanding of the findings of todays visit and agrees with plan of treatment. I have discussed any further diagnostic evaluation that may be needed or ordered today. We also reviewed her medications today. she has been encouraged to call the office with any questions or concerns that should arise related to todays visit.  Reviewed risks and possible side effects associated with taking opiates, benzodiazepines and other CNS depressants. Combination of these could cause dizziness and drowsiness. Advised patient not to drive or operate machinery when taking these medications, as patient's and other's life can be at risk and will have consequences. Patient verbalized understanding in this matter. Dependence and abuse for these drugs will be monitored closely. A Controlled substance policy and procedure is on file which allows [facility] to order a urine drug screen test at any visit. Patient understands and agrees with the plan  This patient was seen by Leretha Pol FNP Collaboration with Dr Lavera Guise as a part of collaborative care agreement  Meds ordered this encounter  Medications  . ALPRAZolam (XANAX) 1 MG tablet    Sig: Take 1 tablet po TID prn anxiety     Dispense:  90 tablet    Refill:  3    Order Specific Question:   Supervising Provider    Answer:   Lavera Guise [place]  . escitalopram (  LEXAPRO) 20 MG tablet    Sig: Take 1.5tablets po QD    Dispense:  45 tablet    Refill:  3    Order Specific Question:   Supervising Provider    Answer:   Lavera Guise [[id number]]  . valACYclovir (VALTREX) 1000 MG tablet    Sig: Take 1 tablet (1,000 mg total) by mouth daily.    Dispense:  30 tablet    Refill:  3    Order Specific Question:   Supervising Provider    Answer:   Lavera Guise [[id number]]  . Fe Cbn-Fe Gluc-FA-B12-C-DSS (FERRALET 90) 90-1 MG TABS    Sig: Take 1 tablet by mouth daily.    Dispense:  30 each    Refill:  3    Patient has manufacturer copay card.    Order Specific Question:   Supervising Provider    Answer:   Lavera Guise [[id number]]    Time spent: 39 Minutes      Dr Lavera Guise Internal medicine

## 2017-12-26 ENCOUNTER — Telehealth: Payer: Self-pay | Admitting: Nurse Practitioner

## 2017-12-26 ENCOUNTER — Encounter: Payer: Self-pay | Admitting: Nurse Practitioner

## 2017-12-26 ENCOUNTER — Other Ambulatory Visit: Payer: Self-pay | Admitting: Nurse Practitioner

## 2017-12-26 DIAGNOSIS — J069 Acute upper respiratory infection, unspecified: Secondary | ICD-10-CM

## 2017-12-26 MED ORDER — AZITHROMYCIN 250 MG PO TABS: ORAL_TABLET | ORAL | 0 refills | Status: DC

## 2017-12-26 NOTE — Progress Notes (Signed)
Due to sinus infection, I sent z-pack to [employer]. Take as directed for 5 days. OTC medication to help relieve symptoms.

## 2017-12-26 NOTE — Telephone Encounter (Signed)
Insurance denied prior authorization ,

## 2017-12-27 ENCOUNTER — Telehealth: Payer: Self-pay | Admitting: Nurse Practitioner

## 2017-12-27 NOTE — Telephone Encounter (Signed)
Left message regarding prior auth denial for medication ferralet 90. Patient does have copay card, making sure patient did get medication

## 2018-01-07 ENCOUNTER — Other Ambulatory Visit: Payer: Self-pay

## 2018-01-07 MED ORDER — ERGOCALCIFEROL 1.25 MG (50000 UT) PO CAPS: 50000.0000 [IU] | ORAL_CAPSULE | ORAL | 1 refills | Status: DC

## 2018-01-16 ENCOUNTER — Encounter: Payer: Self-pay | Admitting: Obstetrics and Gynecology

## 2018-01-16 DIAGNOSIS — N921 Excessive and frequent menstruation with irregular cycle: Secondary | ICD-10-CM

## 2018-01-17 ENCOUNTER — Telehealth: Payer: Self-pay | Admitting: Obstetrics and Gynecology

## 2018-01-17 NOTE — Telephone Encounter (Signed)
Patient is schedule for TVUS on 01/23/18 at 4:00. Please place order thank you!

## 2018-01-17 NOTE — Telephone Encounter (Signed)
Order placed

## 2018-01-23 ENCOUNTER — Ambulatory Visit (INDEPENDENT_AMBULATORY_CARE_PROVIDER_SITE_OTHER): Payer: BLUE CROSS/BLUE SHIELD

## 2018-01-23 DIAGNOSIS — N921 Excessive and frequent menstruation with irregular cycle: Secondary | ICD-10-CM

## 2018-01-24 MED ORDER — MISOPROSTOL 100 MCG PO TABS: 100.0000 ug | ORAL_TABLET | Freq: Once | ORAL | 0 refills | Status: DC

## 2018-01-24 NOTE — Telephone Encounter (Signed)
Pt aware of GYN u/s results for menorrhagia. No BTB. Discussed ablation and IUD. Pt with hx of infertility. Wants to try IUD after further discussed. RTO with menses for Mirena insertion. Rx cytotec/NSAIDs.    ULTRASOUND REPORT  Location: [facility]  Date of Service: 01/23/2018    Indications: Abnormal uterine bleeding Findings:  The uterus is anteverted and measures 7.4 x 4.2 x 4.0cm. Echo texture is homogenous without evidence of focal masses.  The Endometrium measures 7.9 mm.  Right Ovary measures 3.1 x 3.1 x 2.0cm. It is normal in appearance. Left Ovary measures 3.2 x 2.5 x 2.3cm. It is normal in appearance. Survey of the adnexa demonstrates no adnexal masses. There is no free fluid in the cul de sac.  Impression: 1. Normal gyn ultrasound.   Vita Barley, RDMS RVT  The ultrasound images and findings were reviewed by me and I agree with the above report.  Prentice Docker, MD, [facility], [facility] 01/23/2018 5:36 PM

## 2018-02-07 ENCOUNTER — Encounter: Payer: Self-pay | Admitting: Nurse Practitioner

## 2018-02-11 ENCOUNTER — Other Ambulatory Visit: Payer: Self-pay

## 2018-02-11 DIAGNOSIS — H60503 Unspecified acute noninfective otitis externa, bilateral: Secondary | ICD-10-CM

## 2018-02-11 MED ORDER — NEOMYCIN-POLYMYXIN-HC 3.5-10000-1 OT SOLN: 4.0000 [drp] | Freq: Three times a day (TID) | OTIC | 0 refills | Status: DC

## 2018-02-25 ENCOUNTER — Encounter: Payer: Self-pay | Admitting: Obstetrics and Gynecology

## 2018-03-04 ENCOUNTER — Other Ambulatory Visit: Payer: Self-pay | Admitting: Nurse Practitioner

## 2018-03-04 DIAGNOSIS — Z1239 Encounter for other screening for malignant neoplasm of breast: Secondary | ICD-10-CM

## 2018-03-04 DIAGNOSIS — N6001 Solitary cyst of right breast: Secondary | ICD-10-CM

## 2018-03-06 ENCOUNTER — Telehealth: Payer: Self-pay

## 2018-03-06 NOTE — Telephone Encounter (Signed)
Lmom as per heather pt can OTC vitamin D3 5000 IU daily

## 2018-03-28 ENCOUNTER — Ambulatory Visit: Payer: BC Managed Care – PPO | Admitting: Nurse Practitioner

## 2018-03-28 ENCOUNTER — Encounter: Payer: Self-pay | Admitting: Adult Health

## 2018-03-28 ENCOUNTER — Other Ambulatory Visit: Payer: Self-pay

## 2018-03-28 VITALS — BP 102/62 | HR 82 | Resp 16 | Ht 69.0 in | Wt 302.0 lb

## 2018-03-28 DIAGNOSIS — B009 Herpesviral infection, unspecified: Secondary | ICD-10-CM | POA: Diagnosis not present

## 2018-03-28 DIAGNOSIS — K219 Gastro-esophageal reflux disease without esophagitis: Secondary | ICD-10-CM

## 2018-03-28 DIAGNOSIS — F321 Major depressive disorder, single episode, moderate: Secondary | ICD-10-CM

## 2018-03-28 DIAGNOSIS — F411 Generalized anxiety disorder: Secondary | ICD-10-CM | POA: Diagnosis not present

## 2018-03-28 MED ORDER — ESCITALOPRAM OXALATE 20 MG PO TABS: ORAL_TABLET | ORAL | 3 refills | Status: DC

## 2018-03-28 MED ORDER — OMEPRAZOLE 20 MG PO CPDR: 20.0000 mg | DELAYED_RELEASE_CAPSULE | Freq: Two times a day (BID) | ORAL | 3 refills | Status: DC

## 2018-03-28 MED ORDER — ALPRAZOLAM 1 MG PO TABS: ORAL_TABLET | ORAL | 3 refills | Status: DC

## 2018-03-28 MED ORDER — VALACYCLOVIR HCL 1 G PO TABS: 1000.0000 mg | ORAL_TABLET | Freq: Every day | ORAL | 3 refills | Status: DC

## 2018-03-28 NOTE — Progress Notes (Signed)
[facility] [place], [place] [address]  Internal MEDICINE  Office Visit Note  Patient Name: Erin Mcdonald  [id number]  [id number]  Date of Service: 04/11/2018  Chief Complaint  Patient presents with  . Depression    medication refills   . Anxiety    The patient is here for routine follow up visit. She is treated for chronic anxiety and depression and is doing well with her current medications. She takes lexapro 20mg  daily and takes alprazolam 1mg  up to three times daily when needed for acute anxiety. These medications keep her anxiety and depression well managed and enables her to participate in routine, daily activities. She needs to have refills for these medications today.        Current Medication: Outpatient Encounter Medications as of 03/28/2018  Medication Sig  . ALPRAZolam (XANAX) 1 MG tablet Take 1 tablet po TID prn anxiety  . cyanocobalamin (,VITAMIN B-12,) 1000 MCG/ML injection Inject 32ml IM once weekly for 6 weeks then inject 64ml IM monthly  . escitalopram (LEXAPRO) 20 MG tablet Take 1.5tablets po QD  . omeprazole (PRILOSEC) 20 MG capsule Take 1 capsule (20 mg total) by mouth 2 (two) times daily.  . Syringe/Needle, Disp, (SYRINGE 3CC/25GX1") 25G X 1" 3 ML MISC Use for IM b12 injections.  . valACYclovir (VALTREX) 1000 MG tablet Take 1 tablet (1,000 mg total) by mouth daily.  . [DISCONTINUED] ALPRAZolam (XANAX) 1 MG tablet Take 1 tablet po TID prn anxiety  . [DISCONTINUED] escitalopram (LEXAPRO) 20 MG tablet Take 1.5tablets po QD  . [DISCONTINUED] omeprazole (PRILOSEC) 20 MG capsule Take 1 capsule (20 mg total) by mouth 2 (two) times daily.  . [DISCONTINUED] valACYclovir (VALTREX) 1000 MG tablet Take 1 tablet (1,000 mg total) by mouth daily.  . misoprostol (CYTOTEC) 100 MCG tablet Take 1 tablet (100 mcg total) by mouth once for 1 dose. 1 hour before appt  . [DISCONTINUED] azithromycin (ZITHROMAX) 250 MG tablet z-pack - take as directed for 5 days  (Patient not taking: Reported on 03/28/2018)  . [DISCONTINUED] Clobetasol Prop Emollient Base 0.05 % emollient cream Apply 1 application topically 2 (two) times daily. (Patient not taking: Reported on 07/23/2017)  . [DISCONTINUED] ergocalciferol (VITAMIN D2) 1.25 MG (50000 UT) capsule Take 1 capsule (50,000 Units total) by mouth once a week. (Patient not taking: Reported on 03/28/2018)  . [DISCONTINUED] Fe Cbn-Fe Gluc-FA-B12-C-DSS (FERRALET 90) 90-1 MG TABS Take 1 tablet by mouth daily. (Patient not taking: Reported on 03/28/2018)  . [DISCONTINUED] fluconazole (DIFLUCAN) 150 MG tablet Take 1 tablet (150 mg total) by mouth daily. (Patient not taking: Reported on 07/23/2017)  . [DISCONTINUED] meclizine (ANTIVERT) 25 MG tablet Take 1 tablet (25 mg total) by mouth 3 (three) times daily as needed for dizziness. (Patient not taking: Reported on 07/23/2017)  . [DISCONTINUED] neomycin-polymyxin-hydrocortisone (CORTISPORIN) OTIC solution Place 4 drops into both ears 3 (three) times daily. (Patient not taking: Reported on 03/28/2018)  . [DISCONTINUED] phentermine (ADIPEX-P) 37.5 MG tablet Take 1 tablet (37.5 mg total) by mouth daily before breakfast. (Patient not taking: Reported on 07/23/2017)  . [DISCONTINUED] triamcinolone cream (KENALOG) 0.1 % Apply 1 application topically 2 (two) times daily. (Patient not taking: Reported on 07/23/2017)   No facility-administered encounter medications on file as of 03/28/2018.     Surgical History: Past Surgical History:  Procedure Laterality Date  . LAPAROSCOPIC GASTRIC SLEEVE RESECTION    . TONSILLECTOMY      Medical History: Past Medical History:  Diagnosis Date  . Anxiety   .  Depression   . Genital herpes   . Sleep apnea     Family History: Family History  Problem Relation Age of Onset  . Heart attack Mother   . Diabetes Paternal Grandmother   . Diabetes Paternal Grandfather   . Colon cancer Father 48    Social History   Socioeconomic History  . Marital  status: Single    Spouse name: Not on file  . Number of children: Not on file  . Years of education: Not on file  . Highest education level: Not on file  Occupational History  . Not on file  Social Needs  . Financial resource strain: Not on file  . Food insecurity:    Worry: Not on file    Inability: Not on file  . Transportation needs:    Medical: Not on file    Non-medical: Not on file  Tobacco Use  . Smoking status: Current Every Day Smoker  . Smokeless tobacco: Never Used  Substance and Sexual Activity  . Alcohol use: Yes    Comment: occasionally  . Drug use: No  . Sexual activity: Yes    Birth control/protection: None  Lifestyle  . Physical activity:    Days per week: Not on file    Minutes per session: Not on file  . Stress: Not on file  Relationships  . Social connections:    Talks on phone: Not on file    Gets together: Not on file    Attends religious service: Not on file    Active member of club or organization: Not on file    Attends meetings of clubs or organizations: Not on file    Relationship status: Not on file  . Intimate partner violence:    Fear of current or ex partner: Not on file    Emotionally abused: Not on file    Physically abused: Not on file    Forced sexual activity: Not on file  Other Topics Concern  . Not on file  Social History Narrative  . Not on file      Review of Systems  Constitutional: Negative for activity change, chills, fatigue and unexpected weight change.  HENT: Negative for congestion, postnasal drip, rhinorrhea, sinus pain, sore throat and voice change.   Respiratory: Negative for cough and wheezing.   Cardiovascular: Negative for chest pain and palpitations.  Gastrointestinal: Negative for abdominal pain, constipation, diarrhea, nausea and vomiting.  Endocrine: Negative for cold intolerance, heat intolerance, polydipsia and polyuria.  Musculoskeletal: Negative for arthralgias, back pain and myalgias.  Skin:  Negative for rash.  Allergic/Immunologic: Negative for environmental allergies.  Neurological: Positive for headaches. Negative for dizziness.  Hematological: Negative for adenopathy.  Psychiatric/Behavioral: Positive for dysphoric mood. The patient is nervous/anxious.     Today's Vitals   03/28/18 1603  BP: 102/62  Pulse: 82  Resp: 16  SpO2: 96%  Weight: (!) 302 lb (137 kg)  Height: 5\' 9"  (1.753 m)   Body mass index is 44.6 kg/m.   Physical Exam Vitals signs and nursing note reviewed.  Constitutional:      General: She is not in acute distress.    Appearance: Normal appearance. She is well-developed. She is not diaphoretic.  HENT:     Head: Normocephalic and atraumatic.     Mouth/Throat:     Pharynx: No oropharyngeal exudate.  Eyes:     Extraocular Movements: Extraocular movements intact.     Pupils: Pupils are equal, round, and reactive to light.  Neck:  Musculoskeletal: Normal range of motion and neck supple.     Thyroid: No thyromegaly.     Vascular: No JVD.     Trachea: No tracheal deviation.  Cardiovascular:     Rate and Rhythm: Normal rate and regular rhythm.     Heart sounds: Normal heart sounds. No murmur. No friction rub. No gallop.   Pulmonary:     Effort: Pulmonary effort is normal. No respiratory distress.     Breath sounds: Normal breath sounds. No wheezing or rales.  Chest:     Chest wall: No tenderness.  Abdominal:     General: Bowel sounds are normal.     Palpations: Abdomen is soft.     Tenderness: There is no abdominal tenderness.  Musculoskeletal: Normal range of motion.  Lymphadenopathy:     Cervical: No cervical adenopathy.  Skin:    General: Skin is warm and dry.  Neurological:     General: No focal deficit present.     Mental Status: She is alert and oriented to person, place, and time.     Cranial Nerves: No cranial nerve deficit.  Psychiatric:        Mood and Affect: Mood normal.        Behavior: Behavior normal.        Thought  Content: Thought content normal.        Judgment: Judgment normal.   Assessment/Plan:  1. Gastroesophageal reflux disease without esophagitis Continue omeprazole 20mg  twice daily. Refills provided today.  - omeprazole (PRILOSEC) 20 MG capsule; Take 1 capsule (20 mg total) by mouth 2 (two) times daily.  Dispense: 60 capsule; Refill: 3  2. Depression, major, single episode, moderate ([place]) May continue lexapro at 30mg  daily dose. Refills provided today.  - escitalopram (LEXAPRO) 20 MG tablet; Take 1.5tablets po QD  Dispense: 45 tablet; Refill: 3  3. GAD (generalized anxiety disorder) Well controlled with alprazolam at current dose. May continue 1mg  up to three times daily as needed for acute anxiety.  - ALPRAZolam (XANAX) 1 MG tablet; Take 1 tablet po TID prn anxiety  Dispense: 90 tablet; Refill: 3  4. Herpes simplex disease Continue valacyclovir 1000mg  daily. - valACYclovir (VALTREX) 1000 MG tablet; Take 1 tablet (1,000 mg total) by mouth daily.  Dispense: 30 tablet; Refill: 3  General Counseling: Quyen verbalizes understanding of the findings of todays visit and agrees with plan of treatment. I have discussed any further diagnostic evaluation that may be needed or ordered today. We also reviewed her medications today. she has been encouraged to call the office with any questions or concerns that should arise related to todays visit.  This patient was seen by [place] with Dr Lavera Guise as a part of collaborative care agreement  Meds ordered this encounter  Medications  . ALPRAZolam (XANAX) 1 MG tablet    Sig: Take 1 tablet po TID prn anxiety    Dispense:  90 tablet    Refill:  3    Order Specific Question:   Supervising Provider    Answer:   Lavera Guise [place]  . escitalopram (LEXAPRO) 20 MG tablet    Sig: Take 1.5tablets po QD    Dispense:  45 tablet    Refill:  3    Order Specific Question:   Supervising Provider    Answer:   Lavera Guise [[id number]]  .  omeprazole (PRILOSEC) 20 MG capsule    Sig: Take 1 capsule (20 mg total) by mouth 2 (two)  times daily.    Dispense:  60 capsule    Refill:  3    Order Specific Question:   Supervising Provider    Answer:   Lavera Guise [[id number]]  . valACYclovir (VALTREX) 1000 MG tablet    Sig: Take 1 tablet (1,000 mg total) by mouth daily.    Dispense:  30 tablet    Refill:  3    Order Specific Question:   Supervising Provider    Answer:   Lavera Guise [[id number]]    Time spent: 30 Minutes      Dr Lavera Guise Internal medicine

## 2018-04-05 ENCOUNTER — Other Ambulatory Visit: Payer: BLUE CROSS/BLUE SHIELD

## 2018-04-25 ENCOUNTER — Ambulatory Visit: Payer: Self-pay | Admitting: Nurse Practitioner

## 2018-05-13 ENCOUNTER — Other Ambulatory Visit: Payer: Self-pay

## 2018-05-13 ENCOUNTER — Ambulatory Visit
Admission: RE | Admit: 2018-05-13 | Discharge: 2018-05-13 | Disposition: A | Discharge status: Home / Self Care | Payer: BLUE CROSS/BLUE SHIELD | Source: Ambulatory Visit | Attending: Nurse Practitioner | Admitting: Nurse Practitioner

## 2018-05-13 DIAGNOSIS — N6001 Solitary cyst of right breast: Secondary | ICD-10-CM

## 2018-05-13 DIAGNOSIS — N6311 Unspecified lump in the right breast, upper outer quadrant: Secondary | ICD-10-CM | POA: Diagnosis not present

## 2018-05-15 NOTE — Progress Notes (Signed)
Left message and asked pt to call back so I could advise of mammogram. [facility]

## 2018-06-16 ENCOUNTER — Encounter: Payer: Self-pay | Admitting: Nurse Practitioner

## 2018-06-17 ENCOUNTER — Other Ambulatory Visit: Payer: Self-pay

## 2018-06-17 MED ORDER — FLUCONAZOLE 150 MG PO TABS: ORAL_TABLET | ORAL | 0 refills | Status: DC

## 2018-06-17 NOTE — Progress Notes (Signed)
Do they also contact the patient?

## 2018-07-15 ENCOUNTER — Encounter: Payer: Self-pay | Admitting: Nurse Practitioner

## 2018-07-16 ENCOUNTER — Other Ambulatory Visit: Payer: Self-pay

## 2018-07-16 DIAGNOSIS — K219 Gastro-esophageal reflux disease without esophagitis: Secondary | ICD-10-CM

## 2018-07-16 DIAGNOSIS — B009 Herpesviral infection, unspecified: Secondary | ICD-10-CM

## 2018-07-16 DIAGNOSIS — F321 Major depressive disorder, single episode, moderate: Secondary | ICD-10-CM

## 2018-07-16 MED ORDER — OMEPRAZOLE 20 MG PO CPDR: 20.0000 mg | DELAYED_RELEASE_CAPSULE | Freq: Two times a day (BID) | ORAL | 3 refills | Status: DC

## 2018-07-16 MED ORDER — ESCITALOPRAM OXALATE 20 MG PO TABS: ORAL_TABLET | ORAL | 1 refills | Status: DC

## 2018-07-16 MED ORDER — VALACYCLOVIR HCL 1 G PO TABS: 1000.0000 mg | ORAL_TABLET | Freq: Every day | ORAL | 1 refills | Status: DC

## 2018-08-02 ENCOUNTER — Ambulatory Visit: Payer: BC Managed Care – PPO | Admitting: Nurse Practitioner

## 2018-08-02 ENCOUNTER — Other Ambulatory Visit: Payer: Self-pay

## 2018-08-02 ENCOUNTER — Encounter: Payer: Self-pay | Admitting: Nurse Practitioner

## 2018-08-02 VITALS — BP 131/78 | HR 97 | Temp 95.6°F | Resp 16

## 2018-08-02 DIAGNOSIS — R300 Dysuria: Secondary | ICD-10-CM | POA: Diagnosis not present

## 2018-08-02 DIAGNOSIS — N390 Urinary tract infection, site not specified: Secondary | ICD-10-CM

## 2018-08-02 DIAGNOSIS — B373 Candidiasis of vulva and vagina: Secondary | ICD-10-CM

## 2018-08-02 DIAGNOSIS — F411 Generalized anxiety disorder: Secondary | ICD-10-CM | POA: Diagnosis not present

## 2018-08-02 DIAGNOSIS — R319 Hematuria, unspecified: Secondary | ICD-10-CM

## 2018-08-02 DIAGNOSIS — F321 Major depressive disorder, single episode, moderate: Secondary | ICD-10-CM

## 2018-08-02 DIAGNOSIS — Z79899 Other long term (current) drug therapy: Secondary | ICD-10-CM | POA: Diagnosis not present

## 2018-08-02 DIAGNOSIS — B3731 Acute candidiasis of vulva and vagina: Secondary | ICD-10-CM

## 2018-08-02 DIAGNOSIS — B009 Herpesviral infection, unspecified: Secondary | ICD-10-CM

## 2018-08-02 LAB — POCT URINALYSIS DIPSTICK
Bilirubin, UA: POSITIVE
Glucose, UA: NEGATIVE
Ketones, UA: NEGATIVE
Leukocytes, UA: NEGATIVE
Nitrite, UA: POSITIVE
Protein, UA: POSITIVE — AB
Spec Grav, UA: 1.02 (ref 1.010–1.025)
Urobilinogen, UA: 0.2 E.U./dL
pH, UA: 5 (ref 5.0–8.0)

## 2018-08-02 LAB — POCT URINE DRUG SCREEN
POC Amphetamine UR: NOT DETECTED
POC BENZODIAZEPINES UR: POSITIVE — AB
POC Barbiturate UR: NOT DETECTED
POC Cocaine UR: NOT DETECTED
POC Ecstasy UR: NOT DETECTED
POC Marijuana UR: NOT DETECTED
POC Methadone UR: NOT DETECTED
POC Methamphetamine UR: NOT DETECTED
POC Opiate Ur: NOT DETECTED
POC Oxycodone UR: NOT DETECTED
POC PHENCYCLIDINE UR: NOT DETECTED
POC TRICYCLICS UR: POSITIVE — AB

## 2018-08-02 MED ORDER — ALPRAZOLAM 1 MG PO TABS: ORAL_TABLET | ORAL | 3 refills | Status: DC

## 2018-08-02 MED ORDER — SULFAMETHOXAZOLE-TRIMETHOPRIM 800-160 MG PO TABS: 1.0000 | ORAL_TABLET | Freq: Two times a day (BID) | ORAL | 0 refills | Status: DC

## 2018-08-02 MED ORDER — FLUCONAZOLE 150 MG PO TABS: ORAL_TABLET | ORAL | 0 refills | Status: DC

## 2018-08-02 MED ORDER — ESCITALOPRAM OXALATE 20 MG PO TABS: ORAL_TABLET | ORAL | 3 refills | Status: DC

## 2018-08-02 MED ORDER — VALACYCLOVIR HCL 1 G PO TABS: 1000.0000 mg | ORAL_TABLET | Freq: Every day | ORAL | 3 refills | Status: DC

## 2018-08-02 NOTE — Progress Notes (Signed)
[facility], [place] [address]  Internal MEDICINE  Office Visit Note  Patient Name: Erin Mcdonald  [id number]  [id number]  Date of Service: 08/17/2018  Chief Complaint  Patient presents with  . Back Pain  . Anxiety  . Depression    The patient is here for routine follow up visit. She is treated for chronic anxiety and depression and is doing well with her current medications. She takes lexapro 20mg  daily and takes alprazolam 1mg  up to three times daily when needed for acute anxiety. These medications keep her anxiety and depression well managed and enables her to participate in routine, daily activities. She needs to have refills for these medications today.  Today, she is also complaining of low back pain and frequency with urination. This has been going on for several days. She denies nausea or vomiting. She denies fever or chills.        Current Medication: Outpatient Encounter Medications as of 08/02/2018  Medication Sig  . ALPRAZolam (XANAX) 1 MG tablet Take 1 tablet po TID prn anxiety  . cyanocobalamin (,VITAMIN B-12,) 1000 MCG/ML injection Inject 63ml IM once weekly for 6 weeks then inject 43ml IM monthly  . escitalopram (LEXAPRO) 20 MG tablet Take 1.5tablets po QD  . omeprazole (PRILOSEC) 20 MG capsule Take 1 capsule (20 mg total) by mouth 2 (two) times daily.  . Syringe/Needle, Disp, (SYRINGE 3CC/25GX1") 25G X 1" 3 ML MISC Use for IM b12 injections.  . valACYclovir (VALTREX) 1000 MG tablet Take 1 tablet (1,000 mg total) by mouth daily.  . [DISCONTINUED] ALPRAZolam (XANAX) 1 MG tablet Take 1 tablet po TID prn anxiety  . [DISCONTINUED] escitalopram (LEXAPRO) 20 MG tablet Take 1.5tablets po QD  . [DISCONTINUED] fluconazole (DIFLUCAN) 150 MG tablet Take 1 tab po daily for 3 days  . [DISCONTINUED] valACYclovir (VALTREX) 1000 MG tablet Take 1 tablet (1,000 mg total) by mouth daily.  . fluconazole (DIFLUCAN) 150 MG tablet Take 1 tablet po once. May  repeat dose in 3 days as needed for persistent symptoms.  . misoprostol (CYTOTEC) 100 MCG tablet Take 1 tablet (100 mcg total) by mouth once for 1 dose. 1 hour before appt  . sulfamethoxazole-trimethoprim (BACTRIM DS) 800-160 MG tablet Take 1 tablet by mouth 2 (two) times daily.   No facility-administered encounter medications on file as of 08/02/2018.     Surgical History: Past Surgical History:  Procedure Laterality Date  . LAPAROSCOPIC GASTRIC SLEEVE RESECTION    . TONSILLECTOMY      Medical History: Past Medical History:  Diagnosis Date  . Anxiety   . Depression   . Genital herpes   . Sleep apnea     Family History: Family History  Problem Relation Age of Onset  . Heart attack Mother   . Diabetes Paternal Grandmother   . Diabetes Paternal Grandfather   . Colon cancer Father 32    Social History   Socioeconomic History  . Marital status: Single    Spouse name: Not on file  . Number of children: Not on file  . Years of education: Not on file  . Highest education level: Not on file  Occupational History  . Not on file  Social Needs  . Financial resource strain: Not on file  . Food insecurity    Worry: Not on file    Inability: Not on file  . Transportation needs    Medical: Not on file    Non-medical: Not on file  Tobacco  Use  . Smoking status: Current Every Day Smoker  . Smokeless tobacco: Never Used  Substance and Sexual Activity  . Alcohol use: Yes    Comment: occasionally  . Drug use: No  . Sexual activity: Yes    Birth control/protection: None  Lifestyle  . Physical activity    Days per week: Not on file    Minutes per session: Not on file  . Stress: Not on file  Relationships  . Social Herbalist on phone: Not on file    Gets together: Not on file    Attends religious service: Not on file    Active member of club or organization: Not on file    Attends meetings of clubs or organizations: Not on file    Relationship status: Not on  file  . Intimate partner violence    Fear of current or ex partner: Not on file    Emotionally abused: Not on file    Physically abused: Not on file    Forced sexual activity: Not on file  Other Topics Concern  . Not on file  Social History Narrative  . Not on file      Review of Systems  Constitutional: Negative for activity change, chills, fatigue and unexpected weight change.  HENT: Negative for congestion, postnasal drip, rhinorrhea, sinus pain, sore throat and voice change.   Respiratory: Negative for cough and wheezing.   Cardiovascular: Negative for chest pain and palpitations.  Gastrointestinal: Negative for abdominal pain, constipation, diarrhea, nausea and vomiting.  Endocrine: Negative for cold intolerance, heat intolerance, polydipsia and polyuria.  Genitourinary: Positive for dysuria, flank pain, frequency, hematuria and urgency.  Musculoskeletal: Positive for back pain. Negative for arthralgias and myalgias.  Skin: Negative for rash.  Allergic/Immunologic: Negative for environmental allergies.  Neurological: Positive for headaches. Negative for dizziness.  Hematological: Negative for adenopathy.  Psychiatric/Behavioral: Positive for dysphoric mood. The patient is nervous/anxious.    Today's Vitals   08/02/18 1558  BP: 131/78  Pulse: 97  Resp: 16  Temp: (!) 95.6 F (35.3 C)  SpO2: 96%   There is no height or weight on file to calculate BMI.  Physical Exam Vitals signs and nursing note reviewed.  Constitutional:      General: She is not in acute distress.    Appearance: Normal appearance. She is well-developed. She is obese. She is not diaphoretic.  HENT:     Head: Normocephalic and atraumatic.     Mouth/Throat:     Pharynx: No oropharyngeal exudate.  Eyes:     Extraocular Movements: Extraocular movements intact.     Pupils: Pupils are equal, round, and reactive to light.  Neck:     Musculoskeletal: Normal range of motion and neck supple.     Thyroid:  No thyromegaly.     Vascular: No JVD.     Trachea: No tracheal deviation.  Cardiovascular:     Rate and Rhythm: Normal rate and regular rhythm.     Heart sounds: Normal heart sounds. No murmur. No friction rub. No gallop.   Pulmonary:     Effort: Pulmonary effort is normal. No respiratory distress.     Breath sounds: Normal breath sounds. No wheezing or rales.  Chest:     Chest wall: No tenderness.  Abdominal:     General: Bowel sounds are normal.     Palpations: Abdomen is soft.     Tenderness: There is no abdominal tenderness.  Genitourinary:    Comments: Urine sample  positive for protein, nitrites, and large amount of blood.  Musculoskeletal: Normal range of motion.  Lymphadenopathy:     Cervical: No cervical adenopathy.  Skin:    General: Skin is warm and dry.  Neurological:     General: No focal deficit present.     Mental Status: She is alert and oriented to person, place, and time.     Cranial Nerves: No cranial nerve deficit.  Psychiatric:        Mood and Affect: Mood normal.        Behavior: Behavior normal.        Thought Content: Thought content normal.        Judgment: Judgment normal.   Assessment/Plan: 1. Urinary tract infection with hematuria, site unspecified Start bactrim DS bid for 10 days. Send urine for culture and sensitivity. Adjust antibiotics as indicated.  - sulfamethoxazole-trimethoprim (BACTRIM DS) 800-160 MG tablet; Take 1 tablet by mouth 2 (two) times daily.  Dispense: 20 tablet; Refill: 0  2. Candidal vaginitis Diflucan 150mg  should be taken as needed and as prescribed.  - fluconazole (DIFLUCAN) 150 MG tablet; Take 1 tablet po once. May repeat dose in 3 days as needed for persistent symptoms.  Dispense: 3 tablet; Refill: 0  3. Depression, major, single episode, moderate ([place]) Continue lexapro as prescribed  - escitalopram (LEXAPRO) 20 MG tablet; Take 1.5tablets po QD  Dispense: 45 tablet; Refill: 3  4. GAD (generalized anxiety disorder) May  take alprazolam 1mg  up to three times daily if needed for acute anxiety. New prescription provided today.  - ALPRAZolam (XANAX) 1 MG tablet; Take 1 tablet po TID prn anxiety  Dispense: 90 tablet; Refill: 3  5. Encounter for long-term (current) use of medications - POCT Urine Drug Screen appropriately positive for TCA and BZO  6. Herpes simplex disease - valACYclovir (VALTREX) 1000 MG tablet; Take 1 tablet (1,000 mg total) by mouth daily.  Dispense: 30 tablet; Refill: 3  7. Dysuria - POCT Urinalysis Dipstick - CULTURE, URINE COMPREHENSIVE  General Counseling: Gizzelle verbalizes understanding of the findings of todays visit and agrees with plan of treatment. I have discussed any further diagnostic evaluation that may be needed or ordered today. We also reviewed her medications today. she has been encouraged to call the office with any questions or concerns that should arise related to todays visit.  Reviewed risks and possible side effects associated with taking opiates, benzodiazepines and other CNS depressants. Combination of these could cause dizziness and drowsiness. Advised patient not to drive or operate machinery when taking these medications, as patient's and other's life can be at risk and will have consequences. Patient verbalized understanding in this matter. Dependence and abuse for these drugs will be monitored closely. A Controlled substance policy and procedure is on file which allows [facility] to order a urine drug screen test at any visit. Patient understands and agrees with the plan  This patient was seen by Leretha Pol FNP Collaboration with Dr Lavera Guise as a part of collaborative care agreement  Orders Placed This Encounter  Procedures  . CULTURE, URINE COMPREHENSIVE  . POCT Urinalysis Dipstick  . POCT Urine Drug Screen    Meds ordered this encounter  Medications  . sulfamethoxazole-trimethoprim (BACTRIM DS) 800-160 MG tablet    Sig: Take 1 tablet by  mouth 2 (two) times daily.    Dispense:  20 tablet    Refill:  0    Order Specific Question:   Supervising Provider  Answer:   Lavera Guise [place]  . fluconazole (DIFLUCAN) 150 MG tablet    Sig: Take 1 tablet po once. May repeat dose in 3 days as needed for persistent symptoms.    Dispense:  3 tablet    Refill:  0    Order Specific Question:   Supervising Provider    Answer:   Lavera Guise [[id number]]  . ALPRAZolam (XANAX) 1 MG tablet    Sig: Take 1 tablet po TID prn anxiety    Dispense:  90 tablet    Refill:  3    Order Specific Question:   Supervising Provider    Answer:   Lavera Guise [place]  . escitalopram (LEXAPRO) 20 MG tablet    Sig: Take 1.5tablets po QD    Dispense:  45 tablet    Refill:  3    Order Specific Question:   Supervising Provider    Answer:   Lavera Guise [[id number]]  . valACYclovir (VALTREX) 1000 MG tablet    Sig: Take 1 tablet (1,000 mg total) by mouth daily.    Dispense:  30 tablet    Refill:  3    Order Specific Question:   Supervising Provider    Answer:   Lavera Guise [[id number]]    Time spent: 17 Minutes      Dr Lavera Guise Internal medicine

## 2018-08-04 LAB — CULTURE, URINE COMPREHENSIVE

## 2018-08-17 DIAGNOSIS — R300 Dysuria: Secondary | ICD-10-CM | POA: Insufficient documentation

## 2018-08-17 DIAGNOSIS — N390 Urinary tract infection, site not specified: Secondary | ICD-10-CM | POA: Insufficient documentation

## 2018-09-30 ENCOUNTER — Other Ambulatory Visit: Payer: Self-pay | Admitting: Nurse Practitioner

## 2018-09-30 DIAGNOSIS — N6310 Unspecified lump in the right breast, unspecified quadrant: Secondary | ICD-10-CM

## 2018-09-30 DIAGNOSIS — R928 Other abnormal and inconclusive findings on diagnostic imaging of breast: Secondary | ICD-10-CM

## 2018-09-30 DIAGNOSIS — Z1231 Encounter for screening mammogram for malignant neoplasm of breast: Secondary | ICD-10-CM

## 2018-11-04 ENCOUNTER — Other Ambulatory Visit: Payer: Self-pay

## 2018-11-04 DIAGNOSIS — R42 Dizziness and giddiness: Secondary | ICD-10-CM

## 2018-11-04 MED ORDER — MECLIZINE HCL 25 MG PO TABS: 25.0000 mg | ORAL_TABLET | Freq: Three times a day (TID) | ORAL | 1 refills | Status: DC | PRN

## 2018-11-06 ENCOUNTER — Encounter: Payer: Self-pay | Admitting: Nurse Practitioner

## 2018-11-07 ENCOUNTER — Encounter: Payer: Self-pay | Admitting: Internal Medicine

## 2018-11-07 ENCOUNTER — Ambulatory Visit: Payer: BC Managed Care – PPO | Admitting: Internal Medicine

## 2018-11-07 ENCOUNTER — Other Ambulatory Visit: Payer: Self-pay

## 2018-11-07 VITALS — Ht 69.5 in | Wt 294.0 lb

## 2018-11-07 DIAGNOSIS — J0100 Acute maxillary sinusitis, unspecified: Secondary | ICD-10-CM

## 2018-11-07 DIAGNOSIS — J309 Allergic rhinitis, unspecified: Secondary | ICD-10-CM

## 2018-11-07 MED ORDER — FLUTICASONE PROPIONATE 50 MCG/ACT NA SUSP: 2.0000 | Freq: Every day | NASAL | 6 refills | Status: DC

## 2018-11-07 MED ORDER — MONTELUKAST SODIUM 10 MG PO TABS: 10.0000 mg | ORAL_TABLET | Freq: Every day | ORAL | 0 refills | Status: DC

## 2018-11-07 MED ORDER — SULFAMETHOXAZOLE-TRIMETHOPRIM 400-80 MG PO TABS: 1.0000 | ORAL_TABLET | Freq: Two times a day (BID) | ORAL | 0 refills | Status: DC

## 2018-11-07 NOTE — Telephone Encounter (Signed)
Does she need televisit

## 2018-11-07 NOTE — Progress Notes (Signed)
[facility] [place], [place] [address]  Internal MEDICINE  Telephone Visit  Patient Name: Erin Mcdonald  [id number]  [id number]  Date of Service: 11/13/2018  I connected with the patient at 1230by telephone and verified the patients identity using two identifiers.   I discussed the limitations, risks, security and privacy concerns of performing an evaluation and management service by telephone and the availability of in person appointments. I also discussed with the patient that there may be a patient responsible charge related to the service.  The patient expressed understanding and agrees to proceed.    Chief Complaint  Patient presents with  . Telephone Assessment  . Telephone Screen  . Headache  . Sinusitis    HPI Pt is c/o sinus headache and sinus pressure, does not have any nasal discharge, mild sore throat, no exposure to covid 19, no loss of taste or smell No fever or chills   Current Medication: Outpatient Encounter Medications as of 11/07/2018  Medication Sig  . ALPRAZolam (XANAX) 1 MG tablet Take 1 tablet po TID prn anxiety  . cyanocobalamin (,VITAMIN B-12,) 1000 MCG/ML injection Inject 64ml IM once weekly for 6 weeks then inject 80ml IM monthly  . escitalopram (LEXAPRO) 20 MG tablet Take 1.5tablets po QD  . fluconazole (DIFLUCAN) 150 MG tablet Take 1 tablet po once. May repeat dose in 3 days as needed for persistent symptoms.  . fluticasone (FLONASE) 50 MCG/ACT nasal spray Place 2 sprays into both nostrils daily.  . meclizine (ANTIVERT) 25 MG tablet Take 1 tablet (25 mg total) by mouth 3 (three) times daily as needed for dizziness.  . misoprostol (CYTOTEC) 100 MCG tablet Take 1 tablet (100 mcg total) by mouth once for 1 dose. 1 hour before appt  . montelukast (SINGULAIR) 10 MG tablet Take 1 tablet (10 mg total) by mouth at bedtime. For nasal congestion and pressure  . omeprazole (PRILOSEC) 20 MG capsule Take 1 capsule (20 mg total) by mouth 2 (two)  times daily.  Marland Kitchen sulfamethoxazole-trimethoprim (BACTRIM) 400-80 MG tablet Take 1 tablet by mouth 2 (two) times daily for 10 days. For acute sinusitis  . Syringe/Needle, Disp, (SYRINGE 3CC/25GX1") 25G X 1" 3 ML MISC Use for IM b12 injections.  . valACYclovir (VALTREX) 1000 MG tablet Take 1 tablet (1,000 mg total) by mouth daily.  . [DISCONTINUED] sulfamethoxazole-trimethoprim (BACTRIM DS) 800-160 MG tablet Take 1 tablet by mouth 2 (two) times daily.   No facility-administered encounter medications on file as of 11/07/2018.     Surgical History: Past Surgical History:  Procedure Laterality Date  . LAPAROSCOPIC GASTRIC SLEEVE RESECTION    . TONSILLECTOMY      Medical History: Past Medical History:  Diagnosis Date  . Anxiety   . Depression   . Genital herpes   . Sleep apnea     Family History: Family History  Problem Relation Age of Onset  . Heart attack Mother   . Diabetes Paternal Grandmother   . Diabetes Paternal Grandfather   . Colon cancer Father 81    Social History   Socioeconomic History  . Marital status: Single    Spouse name: Not on file  . Number of children: Not on file  . Years of education: Not on file  . Highest education level: Not on file  Occupational History  . Not on file  Social Needs  . Financial resource strain: Not on file  . Food insecurity    Worry: Not on file  Inability: Not on file  . Transportation needs    Medical: Not on file    Non-medical: Not on file  Tobacco Use  . Smoking status: Current Every Day Smoker    Packs/day: 1.00  . Smokeless tobacco: Never Used  Substance and Sexual Activity  . Alcohol use: Yes    Comment: occasionally  . Drug use: No  . Sexual activity: Yes    Birth control/protection: None  Lifestyle  . Physical activity    Days per week: Not on file    Minutes per session: Not on file  . Stress: Not on file  Relationships  . Social Herbalist on phone: Not on file    Gets together: Not on  file    Attends religious service: Not on file    Active member of club or organization: Not on file    Attends meetings of clubs or organizations: Not on file    Relationship status: Not on file  . Intimate partner violence    Fear of current or ex partner: Not on file    Emotionally abused: Not on file    Physically abused: Not on file    Forced sexual activity: Not on file  Other Topics Concern  . Not on file  Social History Narrative  . Not on file    Review of Systems  Constitutional: Negative.   HENT: Positive for sinus pressure, sinus pain and sore throat. Negative for ear pain and sneezing.   Respiratory: Negative.   Cardiovascular: Negative.     Vital Signs: Ht 5' 9.5" (1.765 m)   Wt 294 lb (133.4 kg)   BMI 42.79 kg/m    Observation/Objective:  Slight nasal congestion, NAD   Assessment/Plan: 1. Acute non-recurrent maxillary sinusitis - Start singuliar, if symptoms persists after 2 days, then she can start bactrim  - montelukast (SINGULAIR) 10 MG tablet; Take 1 tablet (10 mg total) by mouth at bedtime. For nasal congestion and pressure  Dispense: 30 tablet; Refill: 0 - sulfamethoxazole-trimethoprim (BACTRIM) 400-80 MG tablet; Take 1 tablet by mouth 2 (two) times daily for 10 days. For acute sinusitis  Dispense: 20 tablet; Refill: 0  2. Allergic rhinitis, unspecified seasonality, unspecified trigger - OTC zyrtec or claritin prn  - fluticasone (FLONASE) 50 MCG/ACT nasal spray; Place 2 sprays into both nostrils daily.  Dispense: 16 g; Refill: 6  General Counseling: Karalyne verbalizes understanding of the findings of today's phone visit and agrees with plan of treatment. I have discussed any further diagnostic evaluation that may be needed or ordered today. We also reviewed her medications today. she has been encouraged to call the office with any questions or concerns that should arise related to todays visit.     Meds ordered this encounter  Medications  .  fluticasone (FLONASE) 50 MCG/ACT nasal spray    Sig: Place 2 sprays into both nostrils daily.    Dispense:  16 g    Refill:  6  . montelukast (SINGULAIR) 10 MG tablet    Sig: Take 1 tablet (10 mg total) by mouth at bedtime. For nasal congestion and pressure    Dispense:  30 tablet    Refill:  0  . sulfamethoxazole-trimethoprim (BACTRIM) 400-80 MG tablet    Sig: Take 1 tablet by mouth 2 (two) times daily for 10 days. For acute sinusitis    Dispense:  20 tablet    Refill:  0    Time spent:10 Minutes    Dr Latricia Heft  [facility]

## 2018-11-14 ENCOUNTER — Encounter: Payer: Self-pay | Admitting: Nurse Practitioner

## 2018-11-14 ENCOUNTER — Ambulatory Visit: Payer: BC Managed Care – PPO | Admitting: Nurse Practitioner

## 2018-11-14 ENCOUNTER — Other Ambulatory Visit: Payer: Self-pay

## 2018-11-14 VITALS — BP 146/80 | HR 80 | Resp 16 | Ht 69.0 in | Wt 299.0 lb

## 2018-11-14 DIAGNOSIS — F321 Major depressive disorder, single episode, moderate: Secondary | ICD-10-CM

## 2018-11-14 DIAGNOSIS — Z79899 Other long term (current) drug therapy: Secondary | ICD-10-CM | POA: Diagnosis not present

## 2018-11-14 DIAGNOSIS — B009 Herpesviral infection, unspecified: Secondary | ICD-10-CM

## 2018-11-14 DIAGNOSIS — F411 Generalized anxiety disorder: Secondary | ICD-10-CM | POA: Diagnosis not present

## 2018-11-14 DIAGNOSIS — K219 Gastro-esophageal reflux disease without esophagitis: Secondary | ICD-10-CM

## 2018-11-14 LAB — POCT URINE DRUG SCREEN
POC Amphetamine UR: NOT DETECTED
POC BENZODIAZEPINES UR: POSITIVE — AB
POC Barbiturate UR: NOT DETECTED
POC Cocaine UR: NOT DETECTED
POC Ecstasy UR: NOT DETECTED
POC Marijuana UR: NOT DETECTED
POC Methadone UR: NOT DETECTED
POC Methamphetamine UR: NOT DETECTED
POC Opiate Ur: NOT DETECTED
POC Oxycodone UR: NOT DETECTED
POC PHENCYCLIDINE UR: NOT DETECTED
POC TRICYCLICS UR: NOT DETECTED

## 2018-11-14 MED ORDER — ALPRAZOLAM 1 MG PO TABS: ORAL_TABLET | ORAL | 3 refills | Status: DC

## 2018-11-14 MED ORDER — VALACYCLOVIR HCL 1 G PO TABS: 1000.0000 mg | ORAL_TABLET | Freq: Every day | ORAL | 3 refills | Status: DC

## 2018-11-14 MED ORDER — ESCITALOPRAM OXALATE 20 MG PO TABS: ORAL_TABLET | ORAL | 3 refills | Status: DC

## 2018-11-14 MED ORDER — OMEPRAZOLE 20 MG PO CPDR: 20.0000 mg | DELAYED_RELEASE_CAPSULE | Freq: Two times a day (BID) | ORAL | 3 refills | Status: DC

## 2018-11-14 NOTE — Progress Notes (Signed)
[facility] [place], [place] [address]  Internal MEDICINE  Office Visit Note  Patient Name: Erin Mcdonald  [id number]  [id number]  Date of Service: 12/06/2018  Chief Complaint  Patient presents with  . Anxiety  . Quality Metric Gaps    mammogram     The patient is here for routine follow up. Was seen last week for sinus infection. Continued to take OTC medication and singulair. She has some pressure in the left ear, but otherwise, feels much better.  She is treated for chronic anxiety and depression and is doing well with her current medications. She takes lexapro 20mg  daily and takes alprazolam 1mg  up to three times daily when needed for acute anxiety. These medications keep her anxiety and depression well managed and enables her to participate in routine, daily activities. She needs to have refills for these medications today.       Current Medication: Outpatient Encounter Medications as of 11/14/2018  Medication Sig  . ALPRAZolam (XANAX) 1 MG tablet Take 1 tablet po TID prn anxiety  . cyanocobalamin (,VITAMIN B-12,) 1000 MCG/ML injection Inject 42ml IM once weekly for 6 weeks then inject 101ml IM monthly  . escitalopram (LEXAPRO) 20 MG tablet Take 1.5tablets po QD  . fluticasone (FLONASE) 50 MCG/ACT nasal spray Place 2 sprays into both nostrils daily.  . meclizine (ANTIVERT) 25 MG tablet Take 1 tablet (25 mg total) by mouth 3 (three) times daily as needed for dizziness.  . montelukast (SINGULAIR) 10 MG tablet Take 1 tablet (10 mg total) by mouth at bedtime. For nasal congestion and pressure  . omeprazole (PRILOSEC) 20 MG capsule Take 1 capsule (20 mg total) by mouth 2 (two) times daily.  . Syringe/Needle, Disp, (SYRINGE 3CC/25GX1") 25G X 1" 3 ML MISC Use for IM b12 injections.  . valACYclovir (VALTREX) 1000 MG tablet Take 1 tablet (1,000 mg total) by mouth daily.  . [DISCONTINUED] ALPRAZolam (XANAX) 1 MG tablet Take 1 tablet po TID prn anxiety  .  [DISCONTINUED] escitalopram (LEXAPRO) 20 MG tablet Take 1.5tablets po QD  . [DISCONTINUED] omeprazole (PRILOSEC) 20 MG capsule Take 1 capsule (20 mg total) by mouth 2 (two) times daily.  . [DISCONTINUED] valACYclovir (VALTREX) 1000 MG tablet Take 1 tablet (1,000 mg total) by mouth daily.  . misoprostol (CYTOTEC) 100 MCG tablet Take 1 tablet (100 mcg total) by mouth once for 1 dose. 1 hour before appt  . [DISCONTINUED] fluconazole (DIFLUCAN) 150 MG tablet Take 1 tablet po once. May repeat dose in 3 days as needed for persistent symptoms. (Patient not taking: Reported on 11/14/2018)  . [DISCONTINUED] sulfamethoxazole-trimethoprim (BACTRIM) 400-80 MG tablet Take 1 tablet by mouth 2 (two) times daily for 10 days. For acute sinusitis (Patient not taking: Reported on 11/14/2018)   No facility-administered encounter medications on file as of 11/14/2018.     Surgical History: Past Surgical History:  Procedure Laterality Date  . LAPAROSCOPIC GASTRIC SLEEVE RESECTION    . TONSILLECTOMY      Medical History: Past Medical History:  Diagnosis Date  . Anxiety   . Depression   . Genital herpes   . Sleep apnea     Family History: Family History  Problem Relation Age of Onset  . Heart attack Mother   . Diabetes Paternal Grandmother   . Diabetes Paternal Grandfather   . Colon cancer Father 32    Social History   Socioeconomic History  . Marital status: Single    Spouse name: Not on file  .  Number of children: Not on file  . Years of education: Not on file  . Highest education level: Not on file  Occupational History  . Not on file  Social Needs  . Financial resource strain: Not on file  . Food insecurity    Worry: Not on file    Inability: Not on file  . Transportation needs    Medical: Not on file    Non-medical: Not on file  Tobacco Use  . Smoking status: Current Every Day Smoker    Packs/day: 1.00  . Smokeless tobacco: Never Used  Substance and Sexual Activity  . Alcohol use:  Yes    Comment: occasionally  . Drug use: No  . Sexual activity: Yes    Birth control/protection: None  Lifestyle  . Physical activity    Days per week: Not on file    Minutes per session: Not on file  . Stress: Not on file  Relationships  . Social Herbalist on phone: Not on file    Gets together: Not on file    Attends religious service: Not on file    Active member of club or organization: Not on file    Attends meetings of clubs or organizations: Not on file    Relationship status: Not on file  . Intimate partner violence    Fear of current or ex partner: Not on file    Emotionally abused: Not on file    Physically abused: Not on file    Forced sexual activity: Not on file  Other Topics Concern  . Not on file  Social History Narrative  . Not on file      Review of Systems  Constitutional: Negative for activity change, chills, fatigue and unexpected weight change.  HENT: Positive for congestion and postnasal drip. Negative for rhinorrhea, sinus pain, sore throat and voice change.        Improving symptoms  Respiratory: Negative for cough and wheezing.   Cardiovascular: Negative for chest pain and palpitations.  Gastrointestinal: Negative for abdominal pain, constipation, diarrhea, nausea and vomiting.  Endocrine: Negative for cold intolerance, heat intolerance, polydipsia and polyuria.  Musculoskeletal: Negative for arthralgias, back pain and myalgias.  Skin: Negative for rash.  Allergic/Immunologic: Negative for environmental allergies.  Neurological: Positive for headaches. Negative for dizziness.  Hematological: Negative for adenopathy.  Psychiatric/Behavioral: Positive for dysphoric mood. The patient is nervous/anxious.        Well managed on current medications.     Today's Vitals   11/14/18 1530  BP: (!) 146/80  Pulse: 80  Resp: 16  SpO2: 98%  Weight: 299 lb (135.6 kg)  Height: 5\' 9"  (1.753 m)   Body mass index is 44.15 kg/m.  Physical  Exam Vitals signs and nursing note reviewed.  Constitutional:      General: She is not in acute distress.    Appearance: Normal appearance. She is well-developed. She is obese. She is not diaphoretic.  HENT:     Head: Normocephalic and atraumatic.     Right Ear: External ear normal.     Left Ear: External ear normal.     Nose: Congestion present.     Mouth/Throat:     Pharynx: No oropharyngeal exudate.  Eyes:     Extraocular Movements: Extraocular movements intact.     Pupils: Pupils are equal, round, and reactive to light.  Neck:     Musculoskeletal: Normal range of motion and neck supple.     Thyroid: No  thyromegaly.     Vascular: No JVD.     Trachea: No tracheal deviation.  Cardiovascular:     Rate and Rhythm: Normal rate and regular rhythm.     Heart sounds: Normal heart sounds. No murmur. No friction rub. No gallop.   Pulmonary:     Effort: Pulmonary effort is normal. No respiratory distress.     Breath sounds: Normal breath sounds. No wheezing or rales.  Chest:     Chest wall: No tenderness.  Abdominal:     General: Bowel sounds are normal.     Palpations: Abdomen is soft.     Tenderness: There is no abdominal tenderness.  Musculoskeletal: Normal range of motion.  Lymphadenopathy:     Cervical: No cervical adenopathy.  Skin:    General: Skin is warm and dry.  Neurological:     Mental Status: She is alert and oriented to person, place, and time.     Cranial Nerves: No cranial nerve deficit.  Psychiatric:        Mood and Affect: Mood normal.        Behavior: Behavior normal.        Thought Content: Thought content normal.        Judgment: Judgment normal.    Assessment/Plan: 1. Gastroesophageal reflux disease without esophagitis - omeprazole (PRILOSEC) 20 MG capsule; Take 1 capsule (20 mg total) by mouth 2 (two) times daily.  Dispense: 60 capsule; Refill: 3  2. Herpes simplex disease - valACYclovir (VALTREX) 1000 MG tablet; Take 1 tablet (1,000 mg total) by  mouth daily.  Dispense: 30 tablet; Refill: 3  3. Depression, major, single episode, moderate (HCC) Stable. Continue lexapro 20mg  daily.  - escitalopram (LEXAPRO) 20 MG tablet; Take 1.5tablets po QD  Dispense: 45 tablet; Refill: 3  4. GAD (generalized anxiety disorder) May take alprazolam 1mg  up to three times daily if needed for acute anxiety. New prescription sent to her pharmacy today.  - ALPRAZolam (XANAX) 1 MG tablet; Take 1 tablet po TID prn anxiety  Dispense: 90 tablet; Refill: 3  5. Encounter for long-term (current) use of medications - POCT Urine Drug Screen appropriately positive for [facility] only.   General Counseling: Cordell verbalizes understanding of the findings of todays visit and agrees with plan of treatment. I have discussed any further diagnostic evaluation that may be needed or ordered today. We also reviewed her medications today. she has been encouraged to call the office with any questions or concerns that should arise related to todays visit.  Reviewed risks and possible side effects associated with taking opiates, benzodiazepines and other CNS depressants. Combination of these could cause dizziness and drowsiness. Advised patient not to drive or operate machinery when taking these medications, as patient's and other's life can be at risk and will have consequences. Patient verbalized understanding in this matter. Dependence and abuse for these drugs will be monitored closely. A Controlled substance policy and procedure is on file which allows [facility] to order a urine drug screen test at any visit. Patient understands and agrees with the plan  This patient was seen by Leretha Pol FNP Collaboration with Dr Lavera Guise as a part of collaborative care agreement  Orders Placed This Encounter  Procedures  . POCT Urine Drug Screen    Meds ordered this encounter  Medications  . ALPRAZolam (XANAX) 1 MG tablet    Sig: Take 1 tablet po TID prn anxiety     Dispense:  90 tablet    Refill:  3    Order Specific Question:   Supervising Provider    Answer:   Lavera Guise [id number]  . escitalopram (LEXAPRO) 20 MG tablet    Sig: Take 1.5tablets po QD    Dispense:  45 tablet    Refill:  3    Order Specific Question:   Supervising Provider    Answer:   Lavera Guise [id number]  . omeprazole (PRILOSEC) 20 MG capsule    Sig: Take 1 capsule (20 mg total) by mouth 2 (two) times daily.    Dispense:  60 capsule    Refill:  3    Order Specific Question:   Supervising Provider    Answer:   Lavera Guise [id number]  . valACYclovir (VALTREX) 1000 MG tablet    Sig: Take 1 tablet (1,000 mg total) by mouth daily.    Dispense:  30 tablet    Refill:  3    Order Specific Question:   Supervising Provider    Answer:   Lavera Guise [id number]    Time spent: 41 Minutes      Dr Lavera Guise Internal medicine

## 2018-12-03 ENCOUNTER — Ambulatory Visit: Payer: BC Managed Care – PPO | Admitting: Adult Health

## 2018-12-17 ENCOUNTER — Ambulatory Visit
Admission: RE | Admit: 2018-12-17 | Discharge: 2018-12-17 | Disposition: A | Discharge status: Home / Self Care | Payer: BC Managed Care – PPO | Source: Ambulatory Visit | Attending: Nurse Practitioner | Admitting: Nurse Practitioner

## 2018-12-17 DIAGNOSIS — R928 Other abnormal and inconclusive findings on diagnostic imaging of breast: Secondary | ICD-10-CM

## 2018-12-17 DIAGNOSIS — N6310 Unspecified lump in the right breast, unspecified quadrant: Secondary | ICD-10-CM

## 2018-12-17 DIAGNOSIS — Z1231 Encounter for screening mammogram for malignant neoplasm of breast: Secondary | ICD-10-CM | POA: Diagnosis not present

## 2018-12-17 DIAGNOSIS — N6311 Unspecified lump in the right breast, upper outer quadrant: Secondary | ICD-10-CM | POA: Diagnosis not present

## 2018-12-17 DIAGNOSIS — R922 Inconclusive mammogram: Secondary | ICD-10-CM | POA: Diagnosis not present

## 2018-12-18 NOTE — Progress Notes (Signed)
Likely benign finding on repeeat images. Follow up mammogram with diagnostic images recommended in 12 months

## 2019-02-24 ENCOUNTER — Encounter: Payer: Self-pay | Admitting: Nurse Practitioner

## 2019-02-24 NOTE — Telephone Encounter (Signed)
Hey. Can we schedule this patient as virtual? Will you just let her know this is fine and preferred since she was recently exposed to [place] [address]. Thanks

## 2019-02-25 ENCOUNTER — Telehealth: Payer: Self-pay

## 2019-02-25 NOTE — Telephone Encounter (Signed)
CONFIRMED 02-27-19 OV AS VIRTUAL.

## 2019-02-26 ENCOUNTER — Other Ambulatory Visit: Payer: Self-pay

## 2019-02-26 ENCOUNTER — Encounter: Payer: Self-pay | Admitting: Nurse Practitioner

## 2019-02-26 ENCOUNTER — Ambulatory Visit: Payer: BC Managed Care – PPO | Admitting: Nurse Practitioner

## 2019-02-26 DIAGNOSIS — E538 Deficiency of other specified B group vitamins: Secondary | ICD-10-CM | POA: Diagnosis not present

## 2019-02-26 DIAGNOSIS — F321 Major depressive disorder, single episode, moderate: Secondary | ICD-10-CM | POA: Diagnosis not present

## 2019-02-26 DIAGNOSIS — F411 Generalized anxiety disorder: Secondary | ICD-10-CM

## 2019-02-26 DIAGNOSIS — K219 Gastro-esophageal reflux disease without esophagitis: Secondary | ICD-10-CM | POA: Diagnosis not present

## 2019-02-26 DIAGNOSIS — B009 Herpesviral infection, unspecified: Secondary | ICD-10-CM

## 2019-02-26 MED ORDER — CYANOCOBALAMIN 1000 MCG/ML IJ SOLN: INTRAMUSCULAR | 5 refills | Status: DC

## 2019-02-26 MED ORDER — ALPRAZOLAM 1 MG PO TABS: ORAL_TABLET | ORAL | 3 refills | Status: DC

## 2019-02-26 MED ORDER — ESCITALOPRAM OXALATE 20 MG PO TABS: ORAL_TABLET | ORAL | 3 refills | Status: DC

## 2019-02-26 MED ORDER — VALACYCLOVIR HCL 1 G PO TABS: 1000.0000 mg | ORAL_TABLET | Freq: Every day | ORAL | 3 refills | Status: DC

## 2019-02-26 MED ORDER — OMEPRAZOLE 20 MG PO CPDR: 20.0000 mg | DELAYED_RELEASE_CAPSULE | Freq: Two times a day (BID) | ORAL | 3 refills | Status: DC

## 2019-02-26 NOTE — Progress Notes (Signed)
[facility] [place], [place] [address]  Internal MEDICINE  Telephone Visit  Patient Name: Erin Mcdonald  [id number]  [id number]  Date of Service: 02/26/2019  I connected with the patient at 12:19pm by webcam and verified the patients identity using two identifiers.   I discussed the limitations, risks, security and privacy concerns of performing an evaluation and management service by webcam and the availability of in person appointments. I also discussed with the patient that there may be a patient responsible charge related to the service.  The patient expressed understanding and agrees to proceed.    Chief Complaint  Patient presents with  . Telephone Assessment  . Telephone Screen  . Anxiety  . Depression    The patient has been contacted via webcam for follow up visit due to concerns for spread of novel coronavirus. The patient presents for routine visit. She is treated for chronic anxiety and depression and is doing well with her current medications. She takes lexapro 20mg  daily and takes alprazolam 1mg  up to three times daily when needed for acute anxiety. These medications keep her anxiety and depression well managed and enables her to participate in routine, daily activities. She needs to have refills for these medications today.      Current Medication: Outpatient Encounter Medications as of 02/26/2019  Medication Sig  . ALPRAZolam (XANAX) 1 MG tablet Take 1 tablet po TID prn anxiety  . cyanocobalamin (,VITAMIN B-12,) 1000 MCG/ML injection Inject 61ml IM once weekly for 6 weeks then inject 23ml IM monthly  . escitalopram (LEXAPRO) 20 MG tablet Take 1.5tablets po QD  . fluticasone (FLONASE) 50 MCG/ACT nasal spray Place 2 sprays into both nostrils daily.  . meclizine (ANTIVERT) 25 MG tablet Take 1 tablet (25 mg total) by mouth 3 (three) times daily as needed for dizziness.  . montelukast (SINGULAIR) 10 MG tablet Take 1 tablet (10 mg total) by mouth at  bedtime. For nasal congestion and pressure  . omeprazole (PRILOSEC) 20 MG capsule Take 1 capsule (20 mg total) by mouth 2 (two) times daily.  . Syringe/Needle, Disp, (SYRINGE 3CC/25GX1") 25G X 1" 3 ML MISC Use for IM b12 injections.  . valACYclovir (VALTREX) 1000 MG tablet Take 1 tablet (1,000 mg total) by mouth daily.  . [DISCONTINUED] ALPRAZolam (XANAX) 1 MG tablet Take 1 tablet po TID prn anxiety  . [DISCONTINUED] cyanocobalamin (,VITAMIN B-12,) 1000 MCG/ML injection Inject 33ml IM once weekly for 6 weeks then inject 38ml IM monthly  . [DISCONTINUED] escitalopram (LEXAPRO) 20 MG tablet Take 1.5tablets po QD  . [DISCONTINUED] omeprazole (PRILOSEC) 20 MG capsule Take 1 capsule (20 mg total) by mouth 2 (two) times daily.  . [DISCONTINUED] valACYclovir (VALTREX) 1000 MG tablet Take 1 tablet (1,000 mg total) by mouth daily.  . misoprostol (CYTOTEC) 100 MCG tablet Take 1 tablet (100 mcg total) by mouth once for 1 dose. 1 hour before appt   No facility-administered encounter medications on file as of 02/26/2019.    Surgical History: Past Surgical History:  Procedure Laterality Date  . LAPAROSCOPIC GASTRIC SLEEVE RESECTION    . TONSILLECTOMY      Medical History: Past Medical History:  Diagnosis Date  . Anxiety   . Depression   . Genital herpes   . Sleep apnea     Family History: Family History  Problem Relation Age of Onset  . Heart attack Mother   . Diabetes Paternal Grandmother   . Diabetes Paternal Grandfather   . Colon cancer Father  75    Social History   Socioeconomic History  . Marital status: Single    Spouse name: Not on file  . Number of children: Not on file  . Years of education: Not on file  . Highest education level: Not on file  Occupational History  . Not on file  Tobacco Use  . Smoking status: Current Every Day Smoker    Packs/day: 1.00  . Smokeless tobacco: Never Used  Substance and Sexual Activity  . Alcohol use: Yes    Comment: occasionally  . Drug  use: No  . Sexual activity: Yes    Birth control/protection: None  Other Topics Concern  . Not on file  Social History Narrative  . Not on file   Social Determinants of Health   Financial Resource Strain:   . Difficulty of Paying Living Expenses: Not on file  Food Insecurity:   . Worried About Charity fundraiser in the Last Year: Not on file  . Ran Out of Food in the Last Year: Not on file  Transportation Needs:   . Lack of Transportation (Medical): Not on file  . Lack of Transportation (Non-Medical): Not on file  Physical Activity:   . Days of Exercise per Week: Not on file  . Minutes of Exercise per Session: Not on file  Stress:   . Feeling of Stress : Not on file  Social Connections:   . Frequency of Communication with Friends and Family: Not on file  . Frequency of Social Gatherings with Friends and Family: Not on file  . Attends Religious Services: Not on file  . Active Member of Clubs or Organizations: Not on file  . Attends Archivist Meetings: Not on file  . Marital Status: Not on file  Intimate Partner Violence:   . Fear of Current or Ex-Partner: Not on file  . Emotionally Abused: Not on file  . Physically Abused: Not on file  . Sexually Abused: Not on file      Review of Systems  Constitutional: Negative for activity change, chills, fatigue and unexpected weight change.  HENT: Negative for congestion, postnasal drip, rhinorrhea, sinus pain, sore throat and voice change.   Respiratory: Negative for cough and wheezing.   Cardiovascular: Negative for chest pain and palpitations.  Gastrointestinal: Negative for abdominal pain, constipation, diarrhea, nausea and vomiting.  Endocrine: Negative for cold intolerance, heat intolerance, polydipsia and polyuria.  Musculoskeletal: Negative for arthralgias, back pain and myalgias.  Skin: Negative for rash.  Allergic/Immunologic: Negative for environmental allergies.  Neurological: Positive for headaches.  Negative for dizziness.  Hematological: Negative for adenopathy.  Psychiatric/Behavioral: Positive for dysphoric mood. The patient is nervous/anxious.     Today's Vitals   02/26/19 1158  Weight: 299 lb (135.6 kg)  Height: 5\' 9"  (1.753 m)   Body mass index is 44.15 kg/m.  Observation/Objective:   The patient is alert and oriented. She is pleasant and answers all questions appropriately. Breathing is non-labored. She is in no acute distress at this time.    Assessment/Plan:  1. Gastroesophageal reflux disease without esophagitis Continue to take omeprazole twice daily as needed.  - omeprazole (PRILOSEC) 20 MG capsule; Take 1 capsule (20 mg total) by mouth 2 (two) times daily.  Dispense: 60 capsule; Refill: 3  2. Vitamin B12 deficiency Monthly B12 injections should be continued.  - cyanocobalamin (,VITAMIN B-12,) 1000 MCG/ML injection; Inject 74ml IM once weekly for 6 weeks then inject 54ml IM monthly  Dispense: 10 mL; Refill: 5  3. Depression, major, single episode, moderate (HCC) Stable. Continue lexapro 20mg  daily.  - escitalopram (LEXAPRO) 20 MG tablet; Take 1.5tablets po QD  Dispense: 45 tablet; Refill: 3  4. GAD (generalized anxiety disorder) May take alprazolam 1mg  up three times daily as needed for acute anxiety. New prescription sent to her pharmacy.  - ALPRAZolam Duanne Moron) 1 MG tablet; Take 1 tablet po TID prn anxiety  Dispense: 90 tablet; Refill: 3  5. Herpes simplex disease - valACYclovir (VALTREX) 1000 MG tablet; Take 1 tablet (1,000 mg total) by mouth daily.  Dispense: 30 tablet; Refill: 3   General Counseling: Cailan verbalizes understanding of the findings of today's phone visit and agrees with plan of treatment. I have discussed any further diagnostic evaluation that may be needed or ordered today. We also reviewed her medications today. she has been encouraged to call the office with any questions or concerns that should arise related to todays visit.  This patient  was seen by [place] with Dr Lavera Guise as a part of collaborative care agreement  Meds ordered this encounter  Medications  . ALPRAZolam (XANAX) 1 MG tablet    Sig: Take 1 tablet po TID prn anxiety    Dispense:  90 tablet    Refill:  3    Order Specific Question:   Supervising Provider    Answer:   Lavera Guise [place]  . escitalopram (LEXAPRO) 20 MG tablet    Sig: Take 1.5tablets po QD    Dispense:  45 tablet    Refill:  3    Order Specific Question:   Supervising Provider    Answer:   Lavera Guise [id number]  . omeprazole (PRILOSEC) 20 MG capsule    Sig: Take 1 capsule (20 mg total) by mouth 2 (two) times daily.    Dispense:  60 capsule    Refill:  3    Order Specific Question:   Supervising Provider    Answer:   Lavera Guise [id number]  . valACYclovir (VALTREX) 1000 MG tablet    Sig: Take 1 tablet (1,000 mg total) by mouth daily.    Dispense:  30 tablet    Refill:  3    Order Specific Question:   Supervising Provider    Answer:   Lavera Guise [id number]  . cyanocobalamin (,VITAMIN B-12,) 1000 MCG/ML injection    Sig: Inject 47ml IM once weekly for 6 weeks then inject 108ml IM monthly    Dispense:  10 mL    Refill:  5    Order Specific Question:   Supervising Provider    Answer:   Lavera Guise [id number]    Time spent: 37 Minutes    Dr Lavera Guise Internal medicine

## 2019-02-27 ENCOUNTER — Ambulatory Visit: Payer: BC Managed Care – PPO | Admitting: Nurse Practitioner

## 2019-03-10 ENCOUNTER — Telehealth: Payer: Self-pay

## 2019-03-10 NOTE — Telephone Encounter (Signed)
Patient is schedule for 03/13/19 

## 2019-03-10 NOTE — Telephone Encounter (Signed)
Pt called needing to schedule annual with [facility] asap and appointment to discuss heavy periods needs iud or ablation. Pt aware you will be calling to schedule

## 2019-03-13 ENCOUNTER — Ambulatory Visit (INDEPENDENT_AMBULATORY_CARE_PROVIDER_SITE_OTHER): Payer: BC Managed Care – PPO | Admitting: Obstetrics and Gynecology

## 2019-03-13 ENCOUNTER — Other Ambulatory Visit: Payer: Self-pay

## 2019-03-13 ENCOUNTER — Encounter: Payer: Self-pay | Admitting: Obstetrics and Gynecology

## 2019-03-13 VITALS — BP 100/70 | Ht 69.0 in | Wt 298.0 lb

## 2019-03-13 DIAGNOSIS — N920 Excessive and frequent menstruation with regular cycle: Secondary | ICD-10-CM

## 2019-03-13 DIAGNOSIS — Z01419 Encounter for gynecological examination (general) (routine) without abnormal findings: Secondary | ICD-10-CM

## 2019-03-13 DIAGNOSIS — Z1231 Encounter for screening mammogram for malignant neoplasm of breast: Secondary | ICD-10-CM

## 2019-03-13 DIAGNOSIS — Z01411 Encounter for gynecological examination (general) (routine) with abnormal findings: Secondary | ICD-10-CM | POA: Diagnosis not present

## 2019-03-13 LAB — POCT HEMOGLOBIN: Hemoglobin: 13.5 g/dL (ref 11–14.6)

## 2019-03-13 NOTE — Patient Instructions (Signed)
I value your feedback and entrusting us with your care. If you get a Erin Mcdonald patient survey, I would appreciate you taking the time to let us know about your experience today. Thank you!  As of December 19, 2018, your lab results will be released to your MyChart immediately, before I even have a chance to see them. Please give me time to review them and contact you if there are any abnormalities. Thank you for your patience.  

## 2019-03-13 NOTE — Progress Notes (Signed)
PCP:  [name], NP   Chief Complaint  Patient presents with  . Gynecologic Exam     HPI:      Ms. Erin Mcdonald is a 44 y.o. G0P0000 who LMP was Patient's last menstrual period was 03/07/2019 (exact date)., presents today for her annual examination.  Her menses are every 4-6 wks now, lasting 4-5 days, med to heavy flow, changing overnight pads Q2 hrs, with dime to quarter sized clots.  Dysmenorrhea mild, occurring premenstrually and with menses, improved with ibup. She does not have intermenstrual bleeding. Discussed BC for cycle control in past but pt not interested in hormones or IUD. Is tobacco user so can't have estrogen products. Would like endometrial ablation.   Sex activity: not currently active, contraception - condoms in past Last Pap: May 26, 2015  Results were: no abnormalities /neg HPV DNA  Hx of STDs: HSV, takes valtrex daily (Rx with PCP)  Last mammogram: 12/17/18  Results were: Cat 3 RT breast due to stable mass; followed by PCP. There is no FH of breast cancer. There is no FH of ovarian cancer. The patient does do self-breast exams.  Tobacco use: 1/2 ppd Alcohol use: social drinker No drug use.  Exercise: not active  She does get adequate calcium and Vitamin D in her diet. Labs with PCP.    Past Medical History:  Diagnosis Date  . Anxiety   . Depression   . Genital herpes   . Sleep apnea     Past Surgical History:  Procedure Laterality Date  . LAPAROSCOPIC GASTRIC SLEEVE RESECTION    . TONSILLECTOMY      Family History  Problem Relation Age of Onset  . Heart attack Mother   . Diabetes Paternal Grandmother   . Diabetes Paternal Grandfather   . Colon cancer Father 8  . Breast cancer Neg Hx   . Ovarian cancer Neg Hx     Social History   Socioeconomic History  . Marital status: Single    Spouse name: Not on file  . Number of children: Not on file  . Years of education: Not on file  . Highest education level: Not on file  Occupational  History  . Not on file  Tobacco Use  . Smoking status: Current Every Day Smoker    Packs/day: 1.00  . Smokeless tobacco: Never Used  Substance and Sexual Activity  . Alcohol use: Yes    Comment: occasionally  . Drug use: No  . Sexual activity: Not Currently    Birth control/protection: None  Other Topics Concern  . Not on file  Social History Narrative  . Not on file   Social Determinants of Health   Financial Resource Strain:   . Difficulty of Paying Living Expenses: Not on file  Food Insecurity:   . Worried About Charity fundraiser in the Last Year: Not on file  . Ran Out of Food in the Last Year: Not on file  Transportation Needs:   . Lack of Transportation (Medical): Not on file  . Lack of Transportation (Non-Medical): Not on file  Physical Activity:   . Days of Exercise per Week: Not on file  . Minutes of Exercise per Session: Not on file  Stress:   . Feeling of Stress : Not on file  Social Connections:   . Frequency of Communication with Friends and Family: Not on file  . Frequency of Social Gatherings with Friends and Family: Not on file  . Attends Religious  Services: Not on file  . Active Member of Clubs or Organizations: Not on file  . Attends Archivist Meetings: Not on file  . Marital Status: Not on file  Intimate Partner Violence:   . Fear of Current or Ex-Partner: Not on file  . Emotionally Abused: Not on file  . Physically Abused: Not on file  . Sexually Abused: Not on file    Outpatient Medications Prior to Visit  Medication Sig Dispense Refill  . ALPRAZolam (XANAX) 1 MG tablet Take 1 tablet po TID prn anxiety 90 tablet 3  . cyanocobalamin (,VITAMIN B-12,) 1000 MCG/ML injection Inject 28ml IM once weekly for 6 weeks then inject 6ml IM monthly 10 mL 5  . escitalopram (LEXAPRO) 20 MG tablet Take 1.5tablets po QD 45 tablet 3  . fluticasone (FLONASE) 50 MCG/ACT nasal spray Place 2 sprays into both nostrils daily. 16 g 6  . omeprazole (PRILOSEC)  20 MG capsule Take 1 capsule (20 mg total) by mouth 2 (two) times daily. 60 capsule 3  . Syringe/Needle, Disp, (SYRINGE 3CC/25GX1") 25G X 1" 3 ML MISC Use for IM b12 injections. 4 each 5  . valACYclovir (VALTREX) 1000 MG tablet Take 1 tablet (1,000 mg total) by mouth daily. 30 tablet 3  . meclizine (ANTIVERT) 25 MG tablet Take 1 tablet (25 mg total) by mouth 3 (three) times daily as needed for dizziness. (Patient not taking: Reported on 03/13/2019) 30 tablet 1  . misoprostol (CYTOTEC) 100 MCG tablet Take 1 tablet (100 mcg total) by mouth once for 1 dose. 1 hour before appt 1 tablet 0  . montelukast (SINGULAIR) 10 MG tablet Take 1 tablet (10 mg total) by mouth at bedtime. For nasal congestion and pressure 30 tablet 0   No facility-administered medications prior to visit.    ROS:  Review of Systems  Constitutional: Negative for fatigue, fever and unexpected weight change.  Respiratory: Negative for cough, shortness of breath and wheezing.   Cardiovascular: Negative for chest pain, palpitations and leg swelling.  Gastrointestinal: Negative for blood in stool, constipation, diarrhea, nausea and vomiting.  Endocrine: Negative for cold intolerance, heat intolerance and polyuria.  Genitourinary: Negative for dyspareunia, dysuria, flank pain, frequency, genital sores, hematuria, menstrual problem, pelvic pain, urgency, vaginal bleeding, vaginal discharge and vaginal pain.  Musculoskeletal: Negative for back pain, joint swelling and myalgias.  Skin: Negative for rash.  Neurological: Negative for dizziness, syncope, light-headedness, numbness and headaches.  Hematological: Negative for adenopathy.  Psychiatric/Behavioral: Negative for agitation, confusion, sleep disturbance and suicidal ideas. The patient is not nervous/anxious.    BREAST: No symptoms   Objective: BP 100/70   Ht 5\' 9"  (1.753 m)   Wt 298 lb (135.2 kg)   LMP 03/07/2019 (Exact Date)   BMI 44.01 kg/m    Physical  Exam Constitutional:      Appearance: She is well-developed.  Genitourinary:     Vulva, vagina, uterus, right adnexa and left adnexa normal.     No vulval lesion or tenderness noted.     No vaginal discharge, erythema or tenderness.     No cervical motion tenderness or polyp.     Uterus is not enlarged or tender.     No right or left adnexal mass present.     Right adnexa not tender.     Left adnexa not tender.  Neck:     Thyroid: No thyromegaly.  Cardiovascular:     Rate and Rhythm: Normal rate and regular rhythm.     Heart  sounds: Normal heart sounds. No murmur.  Pulmonary:     Effort: Pulmonary effort is normal.     Breath sounds: Normal breath sounds.  Chest:     Breasts:        Right: No mass, nipple discharge, skin change or tenderness.        Left: No mass, nipple discharge, skin change or tenderness.  Abdominal:     Palpations: Abdomen is soft.     Tenderness: There is no abdominal tenderness. There is no guarding.  Musculoskeletal:        General: Normal range of motion.     Cervical back: Normal range of motion.  Neurological:     General: No focal deficit present.     Mental Status: She is alert and oriented to person, place, and time.     Cranial Nerves: No cranial nerve deficit.  Skin:    General: Skin is warm and dry.  Psychiatric:        Mood and Affect: Mood normal.        Behavior: Behavior normal.        Thought Content: Thought content normal.        Judgment: Judgment normal.  Vitals reviewed.    RESULTS:  Results for orders placed or performed in visit on 03/13/19 (from the past 24 hour(s))  POCT hemoglobin     Status: Normal   Collection Time: 03/13/19  3:37 PM  Result Value Ref Range   Hemoglobin 13.5 11 - 14.6 g/dL    Assessment/Plan: Encounter for annual routine gynecological examination  Encounter for screening mammogram for malignant neoplasm of breast; pt current on mammo, followed by PCP  Menorrhagia with regular cycle - Plan:  POCT hemoglobin, US PELVIS TRANSVAGINAL NON-OB (TV ONLY)--Discussed prog only options, IUD, ablation, hyst, Lysteda. Will check GYN u/s first. Pt interested in ablation but going to [facility] 5/21 and doesn't want period then. If u/s ok for ablation, pt to RTO with MD for surg consult. May do Lysteda or POPs in meantime for [place] trip and ablation after she returns.            GYN counsel breast self exam, mammography screening, family planning choices, adequate intake of calcium and vitamin D, diet and exercise     F/U  Return in about 1 day (around 03/14/2019) for GYN u/s for menorrhagia--[facility] to call pt.  Najia Hurlbutt B. Dywane Peruski, PA-C 03/13/2019 5:04 PM

## 2019-03-17 ENCOUNTER — Other Ambulatory Visit: Payer: Self-pay

## 2019-03-17 ENCOUNTER — Ambulatory Visit (INDEPENDENT_AMBULATORY_CARE_PROVIDER_SITE_OTHER): Payer: BC Managed Care – PPO

## 2019-03-17 ENCOUNTER — Telehealth: Payer: Self-pay | Admitting: Obstetrics and Gynecology

## 2019-03-17 DIAGNOSIS — N938 Other specified abnormal uterine and vaginal bleeding: Secondary | ICD-10-CM | POA: Diagnosis not present

## 2019-03-17 DIAGNOSIS — N920 Excessive and frequent menstruation with regular cycle: Secondary | ICD-10-CM

## 2019-03-17 NOTE — Telephone Encounter (Signed)
Pt aware of neg GYN u/s results for menorrhagia. Pt interested in ablation but going to [facility] 5/21 and doesn't want to do it before then. Will try Rx lysteda for next menses. If helps, then can continue till after her [place] trip. If doesn't work, then will try POPs in short interim. Pt to f/u prn.   ULTRASOUND REPORT  Location: [facility]  Date of Service: 03/17/2019    Indications:Abnormal Uterine Bleeding Findings:  The uterus is anteverted and measures 7.1 x 4.4 x 3.5 cm. Echo texture is homogenous without evidence of focal masses. The Endometrium measures 7.3 mm.  Right Ovary measures 3.5 x 3.2 x 1.8 cm. It is normal in appearance. Left Ovary measures 3.6 x 3.0 x 2.9 cm. It is normal in appearance. Survey of the adnexa demonstrates no adnexal masses. There is no free fluid in the cul de sac.  Impression: 1. Normal pelvic ultrasound.   Recommendations: 1.Clinical correlation with the patient's History and Physical Exam.   Gweneth Dimitri, RT

## 2019-03-18 MED ORDER — TRANEXAMIC ACID 650 MG PO TABS: 1300.0000 mg | ORAL_TABLET | Freq: Three times a day (TID) | ORAL | 2 refills | Status: DC

## 2019-05-21 ENCOUNTER — Telehealth: Payer: Self-pay

## 2019-05-21 NOTE — Telephone Encounter (Signed)
Lmom for 05-26-19 ov.

## 2019-05-22 ENCOUNTER — Telehealth: Payer: Self-pay

## 2019-05-22 NOTE — Telephone Encounter (Signed)
Confirmed virtual visit on 05/26/2019. klh

## 2019-05-26 ENCOUNTER — Encounter: Payer: Self-pay | Admitting: Nurse Practitioner

## 2019-05-26 ENCOUNTER — Ambulatory Visit (INDEPENDENT_AMBULATORY_CARE_PROVIDER_SITE_OTHER): Payer: BC Managed Care – PPO | Admitting: Nurse Practitioner

## 2019-05-26 VITALS — Resp 16 | Ht 69.5 in | Wt 295.0 lb

## 2019-05-26 DIAGNOSIS — E538 Deficiency of other specified B group vitamins: Secondary | ICD-10-CM

## 2019-05-26 DIAGNOSIS — F321 Major depressive disorder, single episode, moderate: Secondary | ICD-10-CM | POA: Diagnosis not present

## 2019-05-26 DIAGNOSIS — K219 Gastro-esophageal reflux disease without esophagitis: Secondary | ICD-10-CM

## 2019-05-26 DIAGNOSIS — B009 Herpesviral infection, unspecified: Secondary | ICD-10-CM

## 2019-05-26 DIAGNOSIS — F411 Generalized anxiety disorder: Secondary | ICD-10-CM | POA: Diagnosis not present

## 2019-05-26 MED ORDER — VALACYCLOVIR HCL 1 G PO TABS: 1000.0000 mg | ORAL_TABLET | Freq: Every day | ORAL | 3 refills | Status: DC

## 2019-05-26 MED ORDER — ESCITALOPRAM OXALATE 20 MG PO TABS: ORAL_TABLET | ORAL | 3 refills | Status: DC

## 2019-05-26 MED ORDER — OMEPRAZOLE 20 MG PO CPDR: 20.0000 mg | DELAYED_RELEASE_CAPSULE | Freq: Two times a day (BID) | ORAL | 3 refills | Status: DC

## 2019-05-26 MED ORDER — ALPRAZOLAM 1 MG PO TABS: ORAL_TABLET | ORAL | 3 refills | Status: DC

## 2019-05-26 NOTE — Progress Notes (Signed)
[facility] [place], [place] [address]  Internal MEDICINE  Telephone Visit  Patient Name: Erin Mcdonald  [id number]  [id number]  Date of Service: 05/26/2019  I connected with the patient at 3:09pm by telephone and verified the patients identity using two identifiers.   I discussed the limitations, risks, security and privacy concerns of performing an evaluation and management service by telephone and the availability of in person appointments. I also discussed with the patient that there may be a patient responsible charge related to the service.  The patient expressed understanding and agrees to proceed.    Chief Complaint  Patient presents with  . Anxiety  . Depression    The patient has been contacted via telephone for follow up visit due to concerns for spread of novel coronavirus.  The patient states that she is going to [place] next week and needs to have refills of her medications prior to leaving. She states that she is having a bit of increased anxiety. States that her job is talking about bringing everyone back into the office for work rather than have them continue to work from home. Her medications and self coping mechanisms are working well to control this.       Current Medication: Outpatient Encounter Medications as of 05/26/2019  Medication Sig  . ALPRAZolam (XANAX) 1 MG tablet Take 1 tablet po TID prn anxiety  . cyanocobalamin (,VITAMIN B-12,) 1000 MCG/ML injection Inject 38ml IM once weekly for 6 weeks then inject 39ml IM monthly  . escitalopram (LEXAPRO) 20 MG tablet Take 1.5tablets po QD  . fluticasone (FLONASE) 50 MCG/ACT nasal spray Place 2 sprays into both nostrils daily.  . meclizine (ANTIVERT) 25 MG tablet Take 1 tablet (25 mg total) by mouth 3 (three) times daily as needed for dizziness.  Marland Kitchen omeprazole (PRILOSEC) 20 MG capsule Take 1 capsule (20 mg total) by mouth 2 (two) times daily.  . Syringe/Needle, Disp, (SYRINGE 3CC/25GX1") 25G X  1" 3 ML MISC Use for IM b12 injections.  . tranexamic acid (LYSTEDA) 650 MG TABS tablet Take 2 tablets (1,300 mg total) by mouth 3 (three) times daily. Take during menses for a maximum of five days  . valACYclovir (VALTREX) 1000 MG tablet Take 1 tablet (1,000 mg total) by mouth daily.  . [DISCONTINUED] ALPRAZolam (XANAX) 1 MG tablet Take 1 tablet po TID prn anxiety  . [DISCONTINUED] escitalopram (LEXAPRO) 20 MG tablet Take 1.5tablets po QD  . [DISCONTINUED] omeprazole (PRILOSEC) 20 MG capsule Take 1 capsule (20 mg total) by mouth 2 (two) times daily.  . [DISCONTINUED] valACYclovir (VALTREX) 1000 MG tablet Take 1 tablet (1,000 mg total) by mouth daily.   No facility-administered encounter medications on file as of 05/26/2019.    Surgical History: Past Surgical History:  Procedure Laterality Date  . LAPAROSCOPIC GASTRIC SLEEVE RESECTION    . TONSILLECTOMY      Medical History: Past Medical History:  Diagnosis Date  . Anxiety   . Depression   . Genital herpes   . Sleep apnea     Family History: Family History  Problem Relation Age of Onset  . Heart attack Mother   . Diabetes Paternal Grandmother   . Diabetes Paternal Grandfather   . Colon cancer Father 43  . Breast cancer Neg Hx   . Ovarian cancer Neg Hx     Social History   Socioeconomic History  . Marital status: Single    Spouse name: Not on file  . Number of  children: Not on file  . Years of education: Not on file  . Highest education level: Not on file  Occupational History  . Not on file  Tobacco Use  . Smoking status: Current Every Day Smoker    Packs/day: 1.00  . Smokeless tobacco: Never Used  Substance and Sexual Activity  . Alcohol use: Yes    Comment: occasionally  . Drug use: No  . Sexual activity: Not Currently    Birth control/protection: None  Other Topics Concern  . Not on file  Social History Narrative  . Not on file   Social Determinants of Health   Financial Resource Strain:   .  Difficulty of Paying Living Expenses:   Food Insecurity:   . Worried About Charity fundraiser in the Last Year:   . Arboriculturist in the Last Year:   Transportation Needs:   . Film/video editor (Medical):   Marland Kitchen Lack of Transportation (Non-Medical):   Physical Activity:   . Days of Exercise per Week:   . Minutes of Exercise per Session:   Stress:   . Feeling of Stress :   Social Connections:   . Frequency of Communication with Friends and Family:   . Frequency of Social Gatherings with Friends and Family:   . Attends Religious Services:   . Active Member of Clubs or Organizations:   . Attends Archivist Meetings:   Marland Kitchen Marital Status:   Intimate Partner Violence:   . Fear of Current or Ex-Partner:   . Emotionally Abused:   Marland Kitchen Physically Abused:   . Sexually Abused:       Review of Systems  Constitutional: Negative for activity change, chills, fatigue and unexpected weight change.  HENT: Negative for congestion, postnasal drip, rhinorrhea, sinus pain, sore throat and voice change.   Respiratory: Negative for cough and wheezing.   Cardiovascular: Negative for chest pain and palpitations.  Gastrointestinal: Negative for abdominal pain, constipation, diarrhea, nausea and vomiting.  Endocrine: Negative for cold intolerance, heat intolerance, polydipsia and polyuria.  Musculoskeletal: Negative for arthralgias, back pain and myalgias.  Skin: Negative for rash.  Allergic/Immunologic: Negative for environmental allergies.  Neurological: Positive for headaches. Negative for dizziness.  Hematological: Negative for adenopathy.  Psychiatric/Behavioral: Positive for dysphoric mood. The patient is nervous/anxious.     Today's Vitals   05/26/19 1500  Resp: 16  Weight: 295 lb (133.8 kg)  Height: 5' 9.5" (1.765 m)   Body mass index is 42.94 kg/m.  Observation/Objective:   The patient is alert and oriented. She is pleasant and answers all questions appropriately.  Breathing is non-labored. She is in no acute distress at this time.    Assessment/Plan: 1. Gastroesophageal reflux disease without esophagitis Continue omeprazole 20mg  twice daily as needed.  - omeprazole (PRILOSEC) 20 MG capsule; Take 1 capsule (20 mg total) by mouth 2 (two) times daily.  Dispense: 60 capsule; Refill: 3  2. Vitamin B12 deficiency conitnue monthly b12 injections at home.   3. Depression, major, single episode, moderate (HCC) Stable. Continue lexapro as prescribed. Refills provided today.  - escitalopram (LEXAPRO) 20 MG tablet; Take 1.5tablets po QD  Dispense: 45 tablet; Refill: 3  4. GAD (generalized anxiety disorder) May continue to take alprazolam 1mg  up to three times daily as needed for acute anxiety. New prescription sent to her pharmacy today  - ALPRAZolam (XANAX) 1 MG tablet; Take 1 tablet po TID prn anxiety  Dispense: 90 tablet; Refill: 3  5. Herpes simplex disease  Continue valacyclovir as prescribed  - valACYclovir (VALTREX) 1000 MG tablet; Take 1 tablet (1,000 mg total) by mouth daily.  Dispense: 30 tablet; Refill: 3   General Counseling: Erin Mcdonald verbalizes understanding of the findings of today's phone visit and agrees with plan of treatment. I have discussed any further diagnostic evaluation that may be needed or ordered today. We also reviewed her medications today. she has been encouraged to call the office with any questions or concerns that should arise related to todays visit.  This patient was seen by [place] with Dr Lavera Guise as a part of collaborative care agreement  Meds ordered this encounter  Medications  . ALPRAZolam (XANAX) 1 MG tablet    Sig: Take 1 tablet po TID prn anxiety    Dispense:  90 tablet    Refill:  3    Order Specific Question:   Supervising Provider    Answer:   Lavera Guise [place]  . valACYclovir (VALTREX) 1000 MG tablet    Sig: Take 1 tablet (1,000 mg total) by mouth daily.    Dispense:  30 tablet     Refill:  3    Order Specific Question:   Supervising Provider    Answer:   Lavera Guise [id number]  . omeprazole (PRILOSEC) 20 MG capsule    Sig: Take 1 capsule (20 mg total) by mouth 2 (two) times daily.    Dispense:  60 capsule    Refill:  3    Order Specific Question:   Supervising Provider    Answer:   Lavera Guise [id number]  . escitalopram (LEXAPRO) 20 MG tablet    Sig: Take 1.5tablets po QD    Dispense:  45 tablet    Refill:  3    Order Specific Question:   Supervising Provider    Answer:   Lavera Guise [id number]    Time spent: 59 Minutes    Dr Lavera Guise Internal medicine

## 2019-06-12 ENCOUNTER — Other Ambulatory Visit: Payer: Self-pay

## 2019-06-12 ENCOUNTER — Ambulatory Visit (INDEPENDENT_AMBULATORY_CARE_PROVIDER_SITE_OTHER): Payer: BC Managed Care – PPO | Admitting: Obstetrics

## 2019-06-12 ENCOUNTER — Encounter: Payer: Self-pay | Admitting: Obstetrics

## 2019-06-12 DIAGNOSIS — Z23 Encounter for immunization: Secondary | ICD-10-CM | POA: Diagnosis not present

## 2019-06-12 DIAGNOSIS — Z113 Encounter for screening for infections with a predominantly sexual mode of transmission: Secondary | ICD-10-CM

## 2019-06-12 NOTE — Progress Notes (Signed)
Obstetrics & Gynecology Office Visit   Chief Complaint:  Chief Complaint  Patient presents with   Gynecologic Exam    YEAST INFECTION    History of Present Illness: Erin Mcdonald reports for evaluation of two "bumps " she has noticed on her perineal area this week. She is in a new relationship and is sexually active with this partner. She does not know his STI status or hx.  She notice the two hard bumps after showering, and expressed anxiety that they may be condyloma. She has a HSV hx, but takes daily Valtrex. She admits to going on [employer] and getting very worried about what the bumps may be. Recently finished a period, and was in [place] at [place] last week where she reports it was very hot. A recent office visit reveals she is considering an ablation; she has a hx of heavy periods. She has not made an appointment to discuss this with an MD. She would like to be screend for [facility] and [place] today.   Review of Systems:  ROS nonsignificant  With exception of listed Medical Hx below:  Past Medical History:  Past Medical History:  Diagnosis Date   Anxiety    Depression    Genital herpes    Sleep apnea     Past Surgical History:  Past Surgical History:  Procedure Laterality Date   LAPAROSCOPIC GASTRIC SLEEVE RESECTION     TONSILLECTOMY      Gynecologic History: Patient's last menstrual period was 05/22/2019.  Obstetric History: G0P0000  Family History:  Family History  Problem Relation Age of Onset   Heart attack Mother    Diabetes Paternal Grandmother    Diabetes Paternal Grandfather    Colon cancer Father 59   Breast cancer Neg Hx    Ovarian cancer Neg Hx     Social History:  Social History   Socioeconomic History   Marital status: Single    Spouse name: Not on file   Number of children: Not on file   Years of education: Not on file   Highest education level: Not on file  Occupational History   Not on file  Tobacco Use   Smoking status: Current Every  Day Smoker    Packs/day: 1.00   Smokeless tobacco: Never Used  Substance and Sexual Activity   Alcohol use: Yes    Comment: occasionally   Drug use: No   Sexual activity: Not Currently    Birth control/protection: None  Other Topics Concern   Not on file  Social History Narrative   Not on file   Social Determinants of Health   Financial Resource Strain:    Difficulty of Paying Living Expenses:   Food Insecurity:    Worried About Charity fundraiser in the Last Year:    Arboriculturist in the Last Year:   Transportation Needs:    Film/video editor (Medical):    Lack of Transportation (Non-Medical):   Physical Activity:    Days of Exercise per Week:    Minutes of Exercise per Session:   Stress:    Feeling of Stress :   Social Connections:    Frequency of Communication with Friends and Family:    Frequency of Social Gatherings with Friends and Family:    Attends Religious Services:    Active Member of Clubs or Organizations:    Attends Archivist Meetings:    Marital Status:   Intimate Partner Violence:    Fear of Current  or Ex-Partner:    Emotionally Abused:    Physically Abused:    Sexually Abused:     Allergies:  Allergies  Allergen Reactions   Codeine Other (See Comments)    Cause pt to be hyper    Medications: Prior to Admission medications   Medication Sig Start Date End Date Taking? Authorizing Provider  ALPRAZolam Duanne Moron) 1 MG tablet Take 1 tablet po TID prn anxiety 05/26/19  Yes Boscia, Heather E, NP  cyanocobalamin (,VITAMIN B-12,) 1000 MCG/ML injection Inject 76ml IM once weekly for 6 weeks then inject 13ml IM monthly 02/26/19  Yes Boscia, Heather E, NP  escitalopram (LEXAPRO) 20 MG tablet Take 1.5tablets po QD 05/26/19  Yes Boscia, Heather E, NP  fluticasone (FLONASE) 50 MCG/ACT nasal spray Place 2 sprays into both nostrils daily. 11/07/18  Yes Lavera Guise, MD  meclizine (ANTIVERT) 25 MG tablet Take 1 tablet (25 mg  total) by mouth 3 (three) times daily as needed for dizziness. 11/04/18  Yes Boscia, Greer Ee, NP  omeprazole (PRILOSEC) 20 MG capsule Take 1 capsule (20 mg total) by mouth 2 (two) times daily. 05/26/19  Yes Boscia, Heather E, NP  Syringe/Needle, Disp, (SYRINGE 3CC/25GX1") 25G X 1" 3 ML MISC Use for IM b12 injections. 10/25/17  Yes Boscia, Greer Ee, NP  tranexamic acid (LYSTEDA) 650 MG TABS tablet Take 2 tablets (1,300 mg total) by mouth 3 (three) times daily. Take during menses for a maximum of five days [id number]  Yes Copland, Elmo Putt B, PA-C  valACYclovir (VALTREX) 1000 MG tablet Take 1 tablet (1,000 mg total) by mouth daily. 05/26/19  Yes [name], NP    Physical Exam Vitals:  Vitals:   06/12/19 1612  BP: 130/74   Patient's last menstrual period was 05/22/2019.  Physical Exam- focused on the perineal area and pelvic today. Normal hair distribution- some shaving of the mons area noted. No condyloma noted. Two small white slightly raised bumps noted on the lower left perineal area. These appear similar to whitheads. No vaginal discharge noted. Normal vaginal mucosa noted, No CMT.   Assessment: 44 y.o. G0P0000 for evaluation of small perineal/labial lesions. Requesting STI screening (limited)   Plan: Problem List Items Addressed This Visit    None    Visit Diagnoses    Need for diphtheria-tetanus-pertussis (Tdap) vaccine       Screen for STD (sexually transmitted disease)       Relevant Orders   Tdap vaccine greater than or equal to 7yo IM (Completed)   GC/Chlamydia Probe Amp     1. Reassured her that the bumps are not condyloma. Likely smal ingrown hairs or heat bumps. 2. Aptima probe sent for testing. 3 Education regarding HPV provided. 4. Also briefly discussed her f/u with MD to discuss ablation.Encouraged her to make an appointment to discuss with the MDs. 5. Procedure:At  her request,  the larger bump( 4 mm)  was cleanedwith alcohol swab, and a sterile 25 gauge  needle was used to open the larger of the two lesions, and a small amount of white nonpurulent material was removed.  RTC PRN.

## 2019-06-13 DIAGNOSIS — Z113 Encounter for screening for infections with a predominantly sexual mode of transmission: Secondary | ICD-10-CM | POA: Diagnosis not present

## 2019-06-16 ENCOUNTER — Encounter: Payer: Self-pay | Admitting: Obstetrics

## 2019-06-16 LAB — GC/CHLAMYDIA PROBE AMP
Chlamydia trachomatis, NAA: NEGATIVE
Neisseria Gonorrhoeae by PCR: NEGATIVE

## 2019-08-26 ENCOUNTER — Ambulatory Visit: Payer: BC Managed Care – PPO | Admitting: Nurse Practitioner

## 2019-08-26 ENCOUNTER — Encounter: Payer: Self-pay | Admitting: Nurse Practitioner

## 2019-08-26 VITALS — Ht 69.5 in | Wt 295.0 lb

## 2019-08-26 DIAGNOSIS — F321 Major depressive disorder, single episode, moderate: Secondary | ICD-10-CM

## 2019-08-26 DIAGNOSIS — B009 Herpesviral infection, unspecified: Secondary | ICD-10-CM

## 2019-08-26 DIAGNOSIS — E538 Deficiency of other specified B group vitamins: Secondary | ICD-10-CM | POA: Diagnosis not present

## 2019-08-26 DIAGNOSIS — K219 Gastro-esophageal reflux disease without esophagitis: Secondary | ICD-10-CM

## 2019-08-26 DIAGNOSIS — F411 Generalized anxiety disorder: Secondary | ICD-10-CM

## 2019-08-26 MED ORDER — ESCITALOPRAM OXALATE 20 MG PO TABS: ORAL_TABLET | ORAL | 3 refills | Status: DC

## 2019-08-26 MED ORDER — ALPRAZOLAM 1 MG PO TABS: ORAL_TABLET | ORAL | 3 refills | Status: DC

## 2019-08-26 MED ORDER — "SYRINGE 25G X 1"" 3 ML MISC": 5 refills | Status: DC

## 2019-08-26 MED ORDER — OMEPRAZOLE 20 MG PO CPDR: 20.0000 mg | DELAYED_RELEASE_CAPSULE | Freq: Two times a day (BID) | ORAL | 3 refills | Status: DC

## 2019-08-26 MED ORDER — VALACYCLOVIR HCL 1 G PO TABS: 1000.0000 mg | ORAL_TABLET | Freq: Every day | ORAL | 3 refills | Status: DC

## 2019-08-26 MED ORDER — CYANOCOBALAMIN 1000 MCG/ML IJ SOLN: INTRAMUSCULAR | 5 refills | Status: DC

## 2019-08-26 NOTE — Progress Notes (Signed)
[facility] [place], [place] [address]  Internal MEDICINE  Telephone Visit  Patient Name: Erin Mcdonald  [id number]  [id number]  Date of Service: 09/10/2019  I connected with the patient at 3:26pm by telephone and verified the patients identity using two identifiers.   I discussed the limitations, risks, security and privacy concerns of performing an evaluation and management service by telephone and the availability of in person appointments. I also discussed with the patient that there may be a patient responsible charge related to the service.  The patient expressed understanding and agrees to proceed.    Chief Complaint  Patient presents with  . Telephone Assessment    phone no [phone]   . Telephone Screen  . Depression    The patient has been contacted via telephone for follow up visit due to concerns for spread of novel coronavirus. The patient is currently out of town. She admits to having some increased anxiety recently. Overall, she feels like her anxiety and depression are well controlled. She does need to have refills of her routine medications today. She hs no new concerns or complaints today.       Current Medication: Outpatient Encounter Medications as of 08/26/2019  Medication Sig  . ALPRAZolam (XANAX) 1 MG tablet Take 1 tablet po TID prn anxiety  . cyanocobalamin (,VITAMIN B-12,) 1000 MCG/ML injection Inject 72ml IM once weekly for 6 weeks then inject 24ml IM monthly  . escitalopram (LEXAPRO) 20 MG tablet Take 1.5tablets po QD  . fluticasone (FLONASE) 50 MCG/ACT nasal spray Place 2 sprays into both nostrils daily.  . meclizine (ANTIVERT) 25 MG tablet Take 1 tablet (25 mg total) by mouth 3 (three) times daily as needed for dizziness.  Marland Kitchen omeprazole (PRILOSEC) 20 MG capsule Take 1 capsule (20 mg total) by mouth 2 (two) times daily.  . Syringe/Needle, Disp, (SYRINGE 3CC/25GX1") 25G X 1" 3 ML MISC Use for IM b12 injections.  . tranexamic acid  (LYSTEDA) 650 MG TABS tablet Take 2 tablets (1,300 mg total) by mouth 3 (three) times daily. Take during menses for a maximum of five days  . valACYclovir (VALTREX) 1000 MG tablet Take 1 tablet (1,000 mg total) by mouth daily.  . [DISCONTINUED] ALPRAZolam (XANAX) 1 MG tablet Take 1 tablet po TID prn anxiety  . [DISCONTINUED] cyanocobalamin (,VITAMIN B-12,) 1000 MCG/ML injection Inject 77ml IM once weekly for 6 weeks then inject 17ml IM monthly  . [DISCONTINUED] escitalopram (LEXAPRO) 20 MG tablet Take 1.5tablets po QD  . [DISCONTINUED] omeprazole (PRILOSEC) 20 MG capsule Take 1 capsule (20 mg total) by mouth 2 (two) times daily.  . [DISCONTINUED] Syringe/Needle, Disp, (SYRINGE 3CC/25GX1") 25G X 1" 3 ML MISC Use for IM b12 injections.  . [DISCONTINUED] valACYclovir (VALTREX) 1000 MG tablet Take 1 tablet (1,000 mg total) by mouth daily.   No facility-administered encounter medications on file as of 08/26/2019.    Surgical History: Past Surgical History:  Procedure Laterality Date  . LAPAROSCOPIC GASTRIC SLEEVE RESECTION    . TONSILLECTOMY      Medical History: Past Medical History:  Diagnosis Date  . Anxiety   . Depression   . Genital herpes   . Sleep apnea     Family History: Family History  Problem Relation Age of Onset  . Heart attack Mother   . Diabetes Paternal Grandmother   . Diabetes Paternal Grandfather   . Colon cancer Father 66  . Breast cancer Neg Hx   . Ovarian cancer Neg Hx  Social History   Socioeconomic History  . Marital status: Single    Spouse name: Not on file  . Number of children: Not on file  . Years of education: Not on file  . Highest education level: Not on file  Occupational History  . Not on file  Tobacco Use  . Smoking status: Current Every Day Smoker    Packs/day: 1.00  . Smokeless tobacco: Never Used  Vaping Use  . Vaping Use: Never used  Substance and Sexual Activity  . Alcohol use: Yes    Comment: occasionally  . Drug use: No  .  Sexual activity: Not Currently    Birth control/protection: None  Other Topics Concern  . Not on file  Social History Narrative  . Not on file   Social Determinants of Health   Financial Resource Strain:   . Difficulty of Paying Living Expenses: Not on file  Food Insecurity:   . Worried About Charity fundraiser in the Last Year: Not on file  . Ran Out of Food in the Last Year: Not on file  Transportation Needs:   . Lack of Transportation (Medical): Not on file  . Lack of Transportation (Non-Medical): Not on file  Physical Activity:   . Days of Exercise per Week: Not on file  . Minutes of Exercise per Session: Not on file  Stress:   . Feeling of Stress : Not on file  Social Connections:   . Frequency of Communication with Friends and Family: Not on file  . Frequency of Social Gatherings with Friends and Family: Not on file  . Attends Religious Services: Not on file  . Active Member of Clubs or Organizations: Not on file  . Attends Archivist Meetings: Not on file  . Marital Status: Not on file  Intimate Partner Violence:   . Fear of Current or Ex-Partner: Not on file  . Emotionally Abused: Not on file  . Physically Abused: Not on file  . Sexually Abused: Not on file      Review of Systems  Constitutional: Negative for activity change, chills, fatigue and unexpected weight change.  HENT: Negative for congestion, postnasal drip, rhinorrhea, sinus pain, sore throat and voice change.   Respiratory: Negative for cough and wheezing.   Cardiovascular: Negative for chest pain and palpitations.  Gastrointestinal: Negative for abdominal pain, constipation, diarrhea, nausea and vomiting.  Endocrine: Negative for cold intolerance, heat intolerance, polydipsia and polyuria.  Musculoskeletal: Negative for arthralgias, back pain and myalgias.  Skin: Negative for rash.  Allergic/Immunologic: Positive for environmental allergies.  Neurological: Positive for headaches.  Negative for dizziness.  Hematological: Negative for adenopathy.  Psychiatric/Behavioral: Positive for dysphoric mood. The patient is nervous/anxious.        Symptoms are generally well controlled on current medications.     Today's Vitals   08/26/19 1519  Weight: 295 lb (133.8 kg)  Height: 5' 9.5" (1.765 m)   Body mass index is 42.94 kg/m.  Observation/Objective:   The patient is alert and oriented. She is pleasant and answers all questions appropriately. Breathing is non-labored. She is in no acute distress at this time.    Assessment/Plan: 1. Gastroesophageal reflux disease without esophagitis May continue to take prilosec 20mg  twice daily.  - omeprazole (PRILOSEC) 20 MG capsule; Take 1 capsule (20 mg total) by mouth 2 (two) times daily.  Dispense: 60 capsule; Refill: 3  2. Vitamin B12 deficiency Continue monthly b12 injections at home. Refills provided today.  - cyanocobalamin (,VITAMIN  B-12,) 1000 MCG/ML injection; Inject 11ml IM once weekly for 6 weeks then inject 38ml IM monthly  Dispense: 10 mL; Refill: 5 - Syringe/Needle, Disp, (SYRINGE 3CC/25GX1") 25G X 1" 3 ML MISC; Use for IM b12 injections.  Dispense: 4 each; Refill: 5  3. GAD (generalized anxiety disorder) Continue lexapro 30mg  daily. May take alprazolam 1mg  up to three times daily if needed for acute anxiety. New prescription sent to her pharmacy today.  - ALPRAZolam (XANAX) 1 MG tablet; Take 1 tablet po TID prn anxiety  Dispense: 90 tablet; Refill: 3  4. Herpes simplex disease Take valtrex 1000mg  daily to prevent fever blister.  - valACYclovir (VALTREX) 1000 MG tablet; Take 1 tablet (1,000 mg total) by mouth daily.  Dispense: 30 tablet; Refill: 3  5. Depression, major, single episode, moderate ([place]) Continue lexapro as prescribed.  - escitalopram (LEXAPRO) 20 MG tablet; Take 1.5tablets po QD  Dispense: 45 tablet; Refill: 3  General Counseling: Anica verbalizes understanding of the findings of today's phone visit  and agrees with plan of treatment. I have discussed any further diagnostic evaluation that may be needed or ordered today. We also reviewed her medications today. she has been encouraged to call the office with any questions or concerns that should arise related to todays visit.   This patient was seen by Daniels with Dr Lavera Guise as a part of collaborative care agreement  Meds ordered this encounter  Medications  . ALPRAZolam (XANAX) 1 MG tablet    Sig: Take 1 tablet po TID prn anxiety    Dispense:  90 tablet    Refill:  3    Order Specific Question:   Supervising Provider    Answer:   Lavera Guise [place]  . escitalopram (LEXAPRO) 20 MG tablet    Sig: Take 1.5tablets po QD    Dispense:  45 tablet    Refill:  3    Order Specific Question:   Supervising Provider    Answer:   Lavera Guise [[id number]]  . omeprazole (PRILOSEC) 20 MG capsule    Sig: Take 1 capsule (20 mg total) by mouth 2 (two) times daily.    Dispense:  60 capsule    Refill:  3    Order Specific Question:   Supervising Provider    Answer:   Lavera Guise [[id number]]  . valACYclovir (VALTREX) 1000 MG tablet    Sig: Take 1 tablet (1,000 mg total) by mouth daily.    Dispense:  30 tablet    Refill:  3    Order Specific Question:   Supervising Provider    Answer:   Lavera Guise [[id number]]  . cyanocobalamin (,VITAMIN B-12,) 1000 MCG/ML injection    Sig: Inject 4ml IM once weekly for 6 weeks then inject 70ml IM monthly    Dispense:  10 mL    Refill:  5    Order Specific Question:   Supervising Provider    Answer:   Lavera Guise [[id number]]  . Syringe/Needle, Disp, (SYRINGE 3CC/25GX1") 25G X 1" 3 ML MISC    Sig: Use for IM b12 injections.    Dispense:  4 each    Refill:  5    Order Specific Question:   Supervising Provider    Answer:   Lavera Guise [[id number]]    Time spent: 8 Minutes    Dr Lavera Guise Internal medicine

## 2019-10-27 ENCOUNTER — Encounter: Payer: Self-pay | Admitting: Nurse Practitioner

## 2019-11-05 ENCOUNTER — Encounter: Payer: Self-pay | Admitting: Nurse Practitioner

## 2019-11-06 ENCOUNTER — Other Ambulatory Visit: Payer: Self-pay

## 2019-11-06 ENCOUNTER — Other Ambulatory Visit: Payer: Self-pay | Admitting: Nurse Practitioner

## 2019-11-06 DIAGNOSIS — Z1231 Encounter for screening mammogram for malignant neoplasm of breast: Secondary | ICD-10-CM

## 2019-12-09 ENCOUNTER — Other Ambulatory Visit: Payer: Self-pay | Admitting: Nurse Practitioner

## 2019-12-09 ENCOUNTER — Encounter: Payer: Self-pay | Admitting: Nurse Practitioner

## 2019-12-09 DIAGNOSIS — B373 Candidiasis of vulva and vagina: Secondary | ICD-10-CM

## 2019-12-09 DIAGNOSIS — H66003 Acute suppurative otitis media without spontaneous rupture of ear drum, bilateral: Secondary | ICD-10-CM

## 2019-12-09 DIAGNOSIS — B3731 Acute candidiasis of vulva and vagina: Secondary | ICD-10-CM

## 2019-12-09 DIAGNOSIS — J0140 Acute pansinusitis, unspecified: Secondary | ICD-10-CM

## 2019-12-09 MED ORDER — CIPROFLOXACIN-DEXAMETHASONE 0.3-0.1 % OT SUSP: 4.0000 [drp] | Freq: Two times a day (BID) | OTIC | 0 refills | Status: DC

## 2019-12-09 MED ORDER — CEFUROXIME AXETIL 500 MG PO TABS: 500.0000 mg | ORAL_TABLET | Freq: Two times a day (BID) | ORAL | 0 refills | Status: DC

## 2019-12-09 MED ORDER — FLUCONAZOLE 150 MG PO TABS: ORAL_TABLET | ORAL | 0 refills | Status: DC

## 2019-12-24 ENCOUNTER — Encounter: Payer: Self-pay | Admitting: Emergency Medicine

## 2019-12-24 ENCOUNTER — Ambulatory Visit
Admission: EM | Admit: 2019-12-24 | Discharge: 2019-12-24 | Disposition: A | Discharge status: Home / Self Care | Payer: BC Managed Care – PPO | Attending: Family Medicine | Admitting: Family Medicine

## 2019-12-24 ENCOUNTER — Other Ambulatory Visit: Payer: Self-pay

## 2019-12-24 DIAGNOSIS — N390 Urinary tract infection, site not specified: Secondary | ICD-10-CM | POA: Diagnosis not present

## 2019-12-24 DIAGNOSIS — R319 Hematuria, unspecified: Secondary | ICD-10-CM | POA: Diagnosis not present

## 2019-12-24 DIAGNOSIS — R109 Unspecified abdominal pain: Secondary | ICD-10-CM | POA: Insufficient documentation

## 2019-12-24 LAB — URINALYSIS, COMPLETE (UACMP) WITH MICROSCOPIC
Bilirubin Urine: NEGATIVE
Glucose, UA: NEGATIVE mg/dL
Ketones, ur: NEGATIVE mg/dL
Nitrite: POSITIVE — AB
Protein, ur: 100 mg/dL — AB
Specific Gravity, Urine: 1.015 (ref 1.005–1.030)
WBC, UA: >50 WBC/hpf (ref 0–5)
pH: 5.5 (ref 5.0–8.0)

## 2019-12-24 MED ORDER — LEVOFLOXACIN 750 MG PO TABS: 750.0000 mg | ORAL_TABLET | Freq: Every day | ORAL | 0 refills | Status: AC

## 2019-12-24 MED ORDER — KETOROLAC TROMETHAMINE 60 MG/2ML IM SOLN
60.0000 mg | Freq: Once | INTRAMUSCULAR | Status: AC
  Administered 2019-12-24: 60 mg via INTRAMUSCULAR

## 2019-12-24 MED ORDER — ONDANSETRON 4 MG PO TBDP: 4.0000 mg | ORAL_TABLET | Freq: Three times a day (TID) | ORAL | 0 refills | Status: AC | PRN

## 2019-12-24 NOTE — ED Provider Notes (Signed)
[facility]    CSN: [id number] Arrival date & time: 12/24/19  0803      History   Chief Complaint Chief Complaint  Patient presents with  . Abdominal Pain  . Back Pain    HPI Erin Mcdonald is a 44 y.o. female presenting for onset of left lower quadrant abdominal pain with radiation to left side of back yesterday. Also admits to increased urinary frequency and urgency as well. Denies dysuria or hematuria. Denies any associated fever, fatigue, body aches, vomiting. Does admit to some nausea and decreased appetite. Denies any bowel changes-denies constipation or diarrhea.. No vaginal discharge or concern for STIs. Last menstrual period about 3 weeks ago. Has taken Tylenol for pruritus without relief. Patient has a history of gastric sleeve surgery and avoids NSAIDs. Patient has no other complaints or concerns.  HPI  Past Medical History:  Diagnosis Date  . Anxiety   . Depression   . Genital herpes   . Sleep apnea     Patient Active Problem List   Diagnosis Date Noted  . Vitamin B12 deficiency 02/26/2019  . Urinary tract infection with hematuria 08/17/2018  . Dysuria 08/17/2018  . Other fatigue 09/10/2017  . Vitamin D deficiency 09/10/2017  . Impaired fasting glucose 09/10/2017  . Abnormal weight gain 09/10/2017  . Contusion of wrist 07/23/2017  . OSA (obstructive sleep apnea) 07/23/2017  . Family history of colon cancer 07/23/2017  . Menometrorrhagia 07/23/2017  . Atopic dermatitis 06/10/2017  . Depression, major, single episode, moderate ([place]) 06/10/2017  . Candidal vaginitis 02/21/2017  . Major depressive disorder, recurrent, moderate ([place]) 01/18/2017  . GAD (generalized anxiety disorder) 01/18/2017  . Gastroesophageal reflux disease 01/18/2017  . Chronic venous insufficiency 06/14/2016  . Bilateral lower extremity edema 04/12/2016  . Varicose veins of bilateral lower extremities with pain 04/12/2016  . Iron deficiency anemia 08/27/2014  . Encounter for  long-term (current) use of medications 08/15/2012  . Herpes simplex disease 11/02/2008  . OBESITY 04/14/2008  . DEPRESSION 03/10/2008  . WARTS, HAND 02/04/2008  . ANXIETY 10/31/2007  . SNORING 03/27/2007  . URI 02/25/2007    Past Surgical History:  Procedure Laterality Date  . LAPAROSCOPIC GASTRIC SLEEVE RESECTION    . TONSILLECTOMY      OB History    Gravida  0   Para  0   Term  0   Preterm  0   AB  0   Living  0     SAB  0   IAB  0   Ectopic  0   Multiple  0   Live Births  0            Home Medications    Prior to Admission medications   Medication Sig Start Date End Date Taking? Authorizing Provider  ALPRAZolam Duanne Moron) 1 MG tablet Take 1 tablet po TID prn anxiety 08/26/19  Yes Boscia, Heather E, NP  cyanocobalamin (,VITAMIN B-12,) 1000 MCG/ML injection Inject 41ml IM once weekly for 6 weeks then inject 55ml IM monthly 08/26/19  Yes Boscia, Heather E, NP  escitalopram (LEXAPRO) 20 MG tablet Take 1.5tablets po QD 08/26/19  Yes Boscia, Heather E, NP  fluticasone (FLONASE) 50 MCG/ACT nasal spray Place 2 sprays into both nostrils daily. 11/07/18  Yes Lavera Guise, MD  meclizine (ANTIVERT) 25 MG tablet Take 1 tablet (25 mg total) by mouth 3 (three) times daily as needed for dizziness. 11/04/18  Yes [name], NP  valACYclovir (VALTREX) 1000 MG tablet Take  1 tablet (1,000 mg total) by mouth daily. 08/26/19  Yes Boscia, Greer Ee, NP  ciprofloxacin-dexamethasone (CIPRODEX) OTIC suspension Place 4 drops into the right ear 2 (two) times daily. 12/09/19   [name], NP  fluconazole (DIFLUCAN) 150 MG tablet Take 1 tablet po once. May repeat dose in 3 days as needed for persistent symptoms. 12/09/19   [name], NP  levofloxacin (LEVAQUIN) 750 MG tablet Take 1 tablet (750 mg total) by mouth daily for 5 days. 12/24/19 12/29/19  Danton Clap, PA-C  omeprazole (PRILOSEC) 20 MG capsule Take 1 capsule (20 mg total) by mouth 2 (two) times daily.  08/26/19   [name], NP  ondansetron (ZOFRAN ODT) 4 MG disintegrating tablet Take 1 tablet (4 mg total) by mouth every 8 (eight) hours as needed for up to 5 days for nausea or vomiting. 12/24/19 12/29/19  Laurene Footman B, PA-C  Syringe/Needle, Disp, (SYRINGE 3CC/25GX1") 25G X 1" 3 ML MISC Use for IM b12 injections. 08/26/19   [name], NP  tranexamic acid (LYSTEDA) 650 MG TABS tablet Take 2 tablets (1,300 mg total) by mouth 3 (three) times daily. Take during menses for a maximum of five days 02/11/00   Copland, Deirdre Evener, PA-C    Family History Family History  Problem Relation Age of Onset  . Heart attack Mother   . Diabetes Paternal Grandmother   . Diabetes Paternal Grandfather   . Colon cancer Father 50  . Breast cancer Neg Hx   . Ovarian cancer Neg Hx     Social History Social History   Tobacco Use  . Smoking status: Current Every Day Smoker    Packs/day: 1.00  . Smokeless tobacco: Never Used  Vaping Use  . Vaping Use: Never used  Substance Use Topics  . Alcohol use: Yes    Comment: occasionally  . Drug use: No     Allergies   Codeine   Review of Systems Review of Systems  Constitutional: Negative for chills, fatigue and fever.  Gastrointestinal: Positive for abdominal pain and nausea. Negative for diarrhea and vomiting.  Genitourinary: Positive for flank pain, frequency and urgency. Negative for decreased urine volume, dysuria, hematuria, pelvic pain, vaginal bleeding, vaginal discharge and vaginal pain.  Musculoskeletal: Positive for back pain.  Skin: Negative for rash.     Physical Exam Triage Vital Signs ED Triage Vitals  Enc Vitals Group     BP 12/24/19 0826 126/79     Pulse Rate 12/24/19 0826 84     Resp 12/24/19 0826 18     Temp 12/24/19 0826 98.2 F (36.8 C)     Temp Source 12/24/19 0826 Oral     SpO2 12/24/19 0826 99 %     Weight 12/24/19 0823 294 lb 15.6 oz (133.8 kg)     Height 12/24/19 0823 5' 9.5" (1.765 m)     Head  Circumference --      Peak Flow --      Pain Score 12/24/19 0823 8     Pain Loc --      Pain Edu? --      Excl. in [place]? --    No data found.  Updated Vital Signs BP 126/79 (BP Location: Left Arm)   Pulse 84   Temp 98.2 F (36.8 C) (Oral)   Resp 18   Ht 5' 9.5" (1.765 m)   Wt 294 lb 15.6 oz (133.8 kg)   LMP 12/03/2019 (Approximate)   SpO2 99%  BMI 42.94 kg/m       Physical Exam Vitals and nursing note reviewed.  Constitutional:      General: She is not in acute distress.    Appearance: Normal appearance. She is not ill-appearing or toxic-appearing.  HENT:     Head: Normocephalic and atraumatic.  Eyes:     General: No scleral icterus.       Right eye: No discharge.        Left eye: No discharge.     Conjunctiva/sclera: Conjunctivae normal.  Cardiovascular:     Rate and Rhythm: Normal rate and regular rhythm.     Heart sounds: Normal heart sounds.  Pulmonary:     Effort: Pulmonary effort is normal. No respiratory distress.     Breath sounds: Normal breath sounds.  Abdominal:     Palpations: Abdomen is soft.     Tenderness: There is no abdominal tenderness. There is left CVA tenderness (mild/moderate). There is no right CVA tenderness.  Musculoskeletal:     Cervical back: Neck supple.  Skin:    General: Skin is dry.  Neurological:     General: No focal deficit present.     Mental Status: She is alert. Mental status is at baseline.     Motor: No weakness.     Gait: Gait normal.  Psychiatric:        Mood and Affect: Mood normal.        Behavior: Behavior normal.        Thought Content: Thought content normal.      UC Treatments / Results  Labs (all labs ordered are listed, but only abnormal results are displayed) Labs Reviewed  URINALYSIS, COMPLETE (UACMP) WITH MICROSCOPIC - Abnormal; Notable for the following components:      Result Value   APPearance CLOUDY (*)    Hgb urine dipstick LARGE (*)    Protein, ur 100 (*)    Nitrite POSITIVE (*)     Leukocytes,Ua LARGE (*)    Bacteria, UA MANY (*)    All other components within normal limits  URINE CULTURE    EKG   Radiology No results found.  Procedures Procedures (including critical care time)  Medications Ordered in UC Medications  ketorolac (TORADOL) injection 60 mg (60 mg Intramuscular Given 12/24/19 0916)    Initial Impression / Assessment and Plan / UC Course  I have reviewed the triage vital signs and the nursing notes.  Pertinent labs & imaging results that were available during my care of the patient were reviewed by me and considered in my medical decision making (see chart for details).   44 year old female with 1 day history of left back pain and left lower quadrant abdominal pain.  Associated symptoms of urinary frequency and urgency as well as nausea.  All vital signs are normal and stable and she is afebrile.  She is in no acute distress.  On exam, patient has no abdominal tenderness but does have mild to moderate left CVA tenderness.  Urinalysis shows positive nitrites, large leukocytes, large blood and protein.  Urine sent for culture.  Treating for suspected ascending urinary tract infection with Levaquin.  Concern for early pyelonephritis and she does have CVA tenderness with these urinalysis findings.  Advised for care with increased rest and fluids and Tylenol for pain.  Patient given ketorolac injection in clinic for pain.  ED precautions reviewed.  Advised to go to ED for any worsening pain, fever or gross blood in urine.   Final Clinical  Impressions(s) / UC Diagnoses   Final diagnoses:  Urinary tract infection with hematuria, site unspecified  Flank pain     Discharge Instructions     UTI: Based on either symptoms or urinalysis, you may have a urinary tract infection. We will send the urine for culture and call with results in a few days. Begin antibiotics at this time. Your symptoms should be much improved over the next 2-3 days. Increase rest  and fluid intake. If for some reason symptoms are worsening or not improving after a couple of days or the urine culture determines the antibiotics you are taking will not treat the infection, the antibiotics may be changed. Return or go to ER for fever, back pain, worsening urinary pain, discharge, increased blood in urine. May take Tylenol or Motrin OTC for pain relief or consider AZO if no contraindications     ED Prescriptions    Medication Sig Dispense Auth. Provider   levofloxacin (LEVAQUIN) 750 MG tablet Take 1 tablet (750 mg total) by mouth daily for 5 days. 5 tablet Laurene Footman B, PA-C   ondansetron (ZOFRAN ODT) 4 MG disintegrating tablet Take 1 tablet (4 mg total) by mouth every 8 (eight) hours as needed for up to 5 days for nausea or vomiting. 15 tablet Gretta Cool     PDMP not reviewed this encounter.   Danton Clap, PA-C 12/24/19 [phone]

## 2019-12-24 NOTE — Discharge Instructions (Addendum)

## 2019-12-24 NOTE — ED Triage Notes (Signed)
Pt c/o LLQ pain that radiates to her back. She also states she has had urinary frequency.  Started yesterday. Denies fever.

## 2019-12-25 ENCOUNTER — Ambulatory Visit: Payer: BC Managed Care – PPO | Admitting: Nurse Practitioner

## 2019-12-26 LAB — URINE CULTURE: Culture: >=100000 — AB

## 2020-01-11 ENCOUNTER — Encounter: Payer: Self-pay | Admitting: Nurse Practitioner

## 2020-01-12 NOTE — Telephone Encounter (Signed)
Please discuss

## 2020-01-22 ENCOUNTER — Ambulatory Visit: Payer: BC Managed Care – PPO | Admitting: Nurse Practitioner

## 2020-01-22 ENCOUNTER — Telehealth: Payer: Self-pay

## 2020-01-22 ENCOUNTER — Encounter: Payer: Self-pay | Admitting: Nurse Practitioner

## 2020-01-22 VITALS — Resp 16 | Ht 69.0 in | Wt 296.0 lb

## 2020-01-22 DIAGNOSIS — E538 Deficiency of other specified B group vitamins: Secondary | ICD-10-CM

## 2020-01-22 DIAGNOSIS — B009 Herpesviral infection, unspecified: Secondary | ICD-10-CM

## 2020-01-22 DIAGNOSIS — K219 Gastro-esophageal reflux disease without esophagitis: Secondary | ICD-10-CM | POA: Diagnosis not present

## 2020-01-22 DIAGNOSIS — F321 Major depressive disorder, single episode, moderate: Secondary | ICD-10-CM

## 2020-01-22 DIAGNOSIS — R928 Other abnormal and inconclusive findings on diagnostic imaging of breast: Secondary | ICD-10-CM | POA: Diagnosis not present

## 2020-01-22 DIAGNOSIS — F411 Generalized anxiety disorder: Secondary | ICD-10-CM

## 2020-01-22 MED ORDER — OMEPRAZOLE 20 MG PO CPDR: 20.0000 mg | DELAYED_RELEASE_CAPSULE | Freq: Two times a day (BID) | ORAL | 2 refills | Status: DC

## 2020-01-22 MED ORDER — CYANOCOBALAMIN 1000 MCG/ML IJ SOLN: INTRAMUSCULAR | 1 refills | Status: DC

## 2020-01-22 MED ORDER — ESCITALOPRAM OXALATE 20 MG PO TABS: ORAL_TABLET | ORAL | 2 refills | Status: DC

## 2020-01-22 MED ORDER — VALACYCLOVIR HCL 1 G PO TABS: 1000.0000 mg | ORAL_TABLET | Freq: Every day | ORAL | 2 refills | Status: DC

## 2020-01-22 MED ORDER — ALPRAZOLAM 1 MG PO TABS: ORAL_TABLET | ORAL | 2 refills | Status: DC

## 2020-01-22 MED ORDER — "SYRINGE 25G X 1"" 3 ML MISC": 5 refills | Status: DC

## 2020-01-22 NOTE — Progress Notes (Signed)
[facility] [place], [place] [address]  Internal MEDICINE  Telephone Visit  Patient Name: Erin Mcdonald  [id number]  [id number]  Date of Service: 01/22/2020  I connected with the patient at 1:51pm by telephone and verified the patients identity using two identifiers.   I discussed the limitations, risks, security and privacy concerns of performing an evaluation and management service by telephone and the availability of in person appointments. I also discussed with the patient that there may be a patient responsible charge related to the service.  The patient expressed understanding and agrees to proceed.    Chief Complaint  Patient presents with  . Telephone Screen  . telephone assesment  . Depression    The patient has been contacted via telephone for follow up visit due to concerns for spread of novel coronavirus. She presents for routine follow up. She is due to have mammogram. Based on last year's mammogram, she should have bilateral diagnostic mammogram. She should also have ultrasound of the right breast.  Overall, she feels like her anxiety and depression are well controlled. We have tried lowering dose of alprazolam to 0.5mg . she states that this really did not help, even with increase in lexapro to 35mg  daily. We did decrease frequency of dosing to TID as needed from QID PRN. She states that ost days, she is taking this twice daily. Will only take it if needed. She does need to have refills of her routine medications today. She has no new concerns or complaints today.        Current Medication: Outpatient Encounter Medications as of 01/22/2020  Medication Sig  . fluconazole (DIFLUCAN) 150 MG tablet Take 1 tablet po once. May repeat dose in 3 days as needed for persistent symptoms.  . tranexamic acid (LYSTEDA) 650 MG TABS tablet Take 2 tablets (1,300 mg total) by mouth 3 (three) times daily. Take during menses for a maximum of five days  . [DISCONTINUED]  ALPRAZolam (XANAX) 1 MG tablet Take 1 tablet po TID prn anxiety  . [DISCONTINUED] ciprofloxacin-dexamethasone (CIPRODEX) OTIC suspension Place 4 drops into the right ear 2 (two) times daily.  . [DISCONTINUED] cyanocobalamin (,VITAMIN B-12,) 1000 MCG/ML injection Inject 49ml IM once weekly for 6 weeks then inject 25ml IM monthly  . [DISCONTINUED] escitalopram (LEXAPRO) 20 MG tablet Take 1.5tablets po QD  . [DISCONTINUED] fluticasone (FLONASE) 50 MCG/ACT nasal spray Place 2 sprays into both nostrils daily.  . [DISCONTINUED] meclizine (ANTIVERT) 25 MG tablet Take 1 tablet (25 mg total) by mouth 3 (three) times daily as needed for dizziness.  . [DISCONTINUED] omeprazole (PRILOSEC) 20 MG capsule Take 1 capsule (20 mg total) by mouth 2 (two) times daily.  . [DISCONTINUED] Syringe/Needle, Disp, (SYRINGE 3CC/25GX1") 25G X 1" 3 ML MISC Use for IM b12 injections.  . [DISCONTINUED] valACYclovir (VALTREX) 1000 MG tablet Take 1 tablet (1,000 mg total) by mouth daily.  Marland Kitchen ALPRAZolam (XANAX) 1 MG tablet Take 1 tablet po TID prn anxiety  . cyanocobalamin (,VITAMIN B-12,) 1000 MCG/ML injection Inject 32ml IM once weekly for 6 weeks then inject 74ml IM monthly  . escitalopram (LEXAPRO) 20 MG tablet Take 1.5tablets po QD  . omeprazole (PRILOSEC) 20 MG capsule Take 1 capsule (20 mg total) by mouth 2 (two) times daily.  . Syringe/Needle, Disp, (SYRINGE 3CC/25GX1") 25G X 1" 3 ML MISC Use for IM b12 injections.  . valACYclovir (VALTREX) 1000 MG tablet Take 1 tablet (1,000 mg total) by mouth daily.   No facility-administered encounter medications  on file as of 01/22/2020.    Surgical History: Past Surgical History:  Procedure Laterality Date  . LAPAROSCOPIC GASTRIC SLEEVE RESECTION    . TONSILLECTOMY      Medical History: Past Medical History:  Diagnosis Date  . Anxiety   . Depression   . Genital herpes   . Sleep apnea     Family History: Family History  Problem Relation Age of Onset  . Heart attack Mother    . Diabetes Paternal Grandmother   . Diabetes Paternal Grandfather   . Colon cancer Father 67  . Breast cancer Neg Hx   . Ovarian cancer Neg Hx     Social History   Socioeconomic History  . Marital status: Single    Spouse name: Not on file  . Number of children: Not on file  . Years of education: Not on file  . Highest education level: Not on file  Occupational History  . Not on file  Tobacco Use  . Smoking status: Current Every Day Smoker    Packs/day: 1.00  . Smokeless tobacco: Never Used  Vaping Use  . Vaping Use: Never used  Substance and Sexual Activity  . Alcohol use: Yes    Comment: occasionally  . Drug use: No  . Sexual activity: Not Currently    Birth control/protection: None  Other Topics Concern  . Not on file  Social History Narrative  . Not on file   Social Determinants of Health   Financial Resource Strain: Not on file  Food Insecurity: Not on file  Transportation Needs: Not on file  Physical Activity: Not on file  Stress: Not on file  Social Connections: Not on file  Intimate Partner Violence: Not on file      Review of Systems  Constitutional: Negative for activity change, chills, fatigue and unexpected weight change.  HENT: Negative for congestion, postnasal drip, rhinorrhea, sneezing and sore throat.        Chronic allergies - currently not bothering her.   Respiratory: Negative for cough, chest tightness, shortness of breath and wheezing.   Cardiovascular: Negative for chest pain and palpitations.  Gastrointestinal: Negative for abdominal pain, constipation, diarrhea, nausea and vomiting.       Chronic GERD  Endocrine: Negative for cold intolerance, heat intolerance, polydipsia and polyuria.  Musculoskeletal: Negative for arthralgias, back pain, joint swelling and neck pain.  Skin: Negative for rash.  Allergic/Immunologic: Positive for environmental allergies.  Neurological: Negative for dizziness, tremors, numbness and headaches.   Hematological: Negative for adenopathy. Does not bruise/bleed easily.  Psychiatric/Behavioral: Positive for dysphoric mood and sleep disturbance. Negative for behavioral problems (Depression) and suicidal ideas. The patient is nervous/anxious.        Patient's anxiety and depression are currently well managed on lexapro daily and taking alprazolam as needed.     Today's Vitals   01/22/20 1345  Resp: 16  Weight: 296 lb (134.3 kg)  Height: 5\' 9"  (1.753 m)   Body mass index is 43.71 kg/m.   Observation/Objective:   The patient is alert and oriented. She is pleasant and answers all questions appropriately. Breathing is non-labored. She is in no acute distress at this time.    Assessment/Plan: 1. Abnormal mammogram Abnormal mammogram 12/2018. Will get bilateral diagnostic mammogram per recommendations from [facility] breast center  - MM Digital Diagnostic Bilat; Future  2. Abnormal mammogram of right breast Will get ultrasound of right breast for recommendations from [facility] breast center.  - US BREAST ASPIRATION RIGHT; Future  3.  Gastroesophageal reflux disease without esophagitis conitnue omeprazole as previously prescribed  - omeprazole (PRILOSEC) 20 MG capsule; Take 1 capsule (20 mg total) by mouth 2 (two) times daily.  Dispense: 60 capsule; Refill: 2  4. Depression, major, single episode, moderate (HCC) Stable. Continue lexapro 35mg  daily. Refills provided today.  - escitalopram (LEXAPRO) 20 MG tablet; Take 1.5tablets po QD  Dispense: 45 tablet; Refill: 2  5. GAD (generalized anxiety disorder) May conitnue to take alprazolam 1mg  up to TID as needed. Fill history profile reviewed per PDMP. New prescription sent to her pharmacy today. - ALPRAZolam (XANAX) 1 MG tablet; Take 1 tablet po TID prn anxiety  Dispense: 90 tablet; Refill: 2  6. Herpes simplex disease Continue valacyclovir daily to prevent flare of herpes simplex.  - valACYclovir (VALTREX) 1000 MG tablet; Take 1  tablet (1,000 mg total) by mouth daily.  Dispense: 30 tablet; Refill: 2  7. Vitamin B12 deficiency Continue with monthly b12 injections.  - Syringe/Needle, Disp, (SYRINGE 3CC/25GX1") 25G X 1" 3 ML MISC; Use for IM b12 injections.  Dispense: 4 each; Refill: 5 - cyanocobalamin (,VITAMIN B-12,) 1000 MCG/ML injection; Inject 6ml IM once weekly for 6 weeks then inject 65ml IM monthly  Dispense: 10 mL; Refill: 1  General Counseling: Maddux verbalizes understanding of the findings of today's phone visit and agrees with plan of treatment. I have discussed any further diagnostic evaluation that may be needed or ordered today. We also reviewed her medications today. she has been encouraged to call the office with any questions or concerns that should arise related to todays visit.  Reviewed risks and possible side effects associated with taking opiates, benzodiazepines and other CNS depressants. Combination of these could cause dizziness and drowsiness. Advised patient not to drive or operate machinery when taking these medications, as patient's and other's life can be at risk and will have consequences. Patient verbalized understanding in this matter. Dependence and abuse for these drugs will be monitored closely. A Controlled substance policy and procedure is on file which allows [facility] to order a urine drug screen test at any visit. Patient understands and agrees with the plan  This patient was seen by Leretha Pol FNP Collaboration with Dr Lavera Guise as a part of collaborative care agreement  Orders Placed This Encounter  Procedures  . MM Digital Diagnostic Bilat  . US BREAST ASPIRATION RIGHT    Meds ordered this encounter  Medications  . valACYclovir (VALTREX) 1000 MG tablet    Sig: Take 1 tablet (1,000 mg total) by mouth daily.    Dispense:  30 tablet    Refill:  2    Please fill with generic alternative preferred by her insurance.    Order Specific Question:   Supervising  Provider    Answer:   Lavera Guise [[id number]]  . Syringe/Needle, Disp, (SYRINGE 3CC/25GX1") 25G X 1" 3 ML MISC    Sig: Use for IM b12 injections.    Dispense:  4 each    Refill:  5    Order Specific Question:   Supervising Provider    Answer:   Lavera Guise [[id number]]  . omeprazole (PRILOSEC) 20 MG capsule    Sig: Take 1 capsule (20 mg total) by mouth 2 (two) times daily.    Dispense:  60 capsule    Refill:  2    Order Specific Question:   Supervising Provider    Answer:   Lavera Guise [[id number]]  . escitalopram (LEXAPRO) 20 MG  tablet    Sig: Take 1.5tablets po QD    Dispense:  45 tablet    Refill:  2    Order Specific Question:   Supervising Provider    Answer:   Lavera Guise [[id number]]  . cyanocobalamin (,VITAMIN B-12,) 1000 MCG/ML injection    Sig: Inject 24ml IM once weekly for 6 weeks then inject 12ml IM monthly    Dispense:  10 mL    Refill:  1    Order Specific Question:   Supervising Provider    Answer:   Lavera Guise [[id number]]  . ALPRAZolam (XANAX) 1 MG tablet    Sig: Take 1 tablet po TID prn anxiety    Dispense:  90 tablet    Refill:  2    Order Specific Question:   Supervising Provider    Answer:   Lavera Guise [[id number]]    Time spent: 39 Minutes    Dr Lavera Guise Internal medicine

## 2020-01-22 NOTE — Telephone Encounter (Signed)
Mailed patient AVS and maps for order for mammogram. Erin Mcdonald

## 2020-01-23 ENCOUNTER — Ambulatory Visit: Payer: BC Managed Care – PPO | Admitting: Hospice and Palliative Medicine

## 2020-02-10 ENCOUNTER — Ambulatory Visit
Admission: RE | Admit: 2020-02-10 | Discharge: 2020-02-10 | Disposition: A | Discharge status: Home / Self Care | Payer: BC Managed Care – PPO | Source: Ambulatory Visit | Attending: Nurse Practitioner | Admitting: Nurse Practitioner

## 2020-02-10 ENCOUNTER — Other Ambulatory Visit: Payer: Self-pay

## 2020-02-10 DIAGNOSIS — R928 Other abnormal and inconclusive findings on diagnostic imaging of breast: Secondary | ICD-10-CM | POA: Insufficient documentation

## 2020-02-10 DIAGNOSIS — N6001 Solitary cyst of right breast: Secondary | ICD-10-CM | POA: Diagnosis not present

## 2020-02-10 DIAGNOSIS — R922 Inconclusive mammogram: Secondary | ICD-10-CM | POA: Diagnosis not present

## 2020-02-13 ENCOUNTER — Encounter: Payer: Self-pay | Admitting: Nurse Practitioner

## 2020-02-13 ENCOUNTER — Ambulatory Visit: Payer: BC Managed Care – PPO | Admitting: Physician Assistant

## 2020-02-13 VITALS — Temp 98.7°F | Ht 69.0 in | Wt 296.0 lb

## 2020-02-13 DIAGNOSIS — J0100 Acute maxillary sinusitis, unspecified: Secondary | ICD-10-CM

## 2020-02-13 DIAGNOSIS — H1033 Unspecified acute conjunctivitis, bilateral: Secondary | ICD-10-CM

## 2020-02-13 MED ORDER — AZITHROMYCIN 250 MG PO TABS: ORAL_TABLET | ORAL | 0 refills | Status: DC

## 2020-02-13 MED ORDER — POLYMYXIN B-TRIMETHOPRIM 10000-0.1 UNIT/ML-% OP SOLN: 1.0000 [drp] | Freq: Four times a day (QID) | OPHTHALMIC | 0 refills | Status: AC

## 2020-02-13 NOTE — Progress Notes (Signed)
[facility] [place],  [address]  Internal MEDICINE  Telephone Visit  Patient Name: Erin Mcdonald  [id number]  [id number]  Date of Service: 02/13/2020  I connected with the patient at 11:52 by telephone and verified the patients identity using two identifiers.   I discussed the limitations, risks, security and privacy concerns of performing an evaluation and management service by telephone and the availability of in person appointments. I also discussed with the patient that there may be a patient responsible charge related to the service.  The patient expressed understanding and agrees to proceed.    Chief Complaint  Patient presents with  . Telephone Assessment    [id number]   . Telephone Screen  . Sinusitis  . Fever  . Headache       . Conjunctivitis    Both eyes     HPI Pt presents today for a sick visit. She is complaining of a head cold all week. Admits sinus congestion, sinus pressure, and headaches for the last week. She has not tried any OTC meds for this. Last night started having red, swollen, eyes with pus present bilaterally. She has used visine drops so far, but it hasn't helped. She feels like her on eye is almost swollen shut. She has not tried warm compresses. Her temp is 99 today. Denies SOB, wheezing. Her eyes are bothering her the most right now, but sinus congestion is still present and not improving.  Current Medication: Outpatient Encounter Medications as of 02/13/2020  Medication Sig  . ALPRAZolam (XANAX) 1 MG tablet Take 1 tablet po TID prn anxiety  . azithromycin (ZITHROMAX) 250 MG tablet Use as directed  . cyanocobalamin (,VITAMIN B-12,) 1000 MCG/ML injection Inject 29ml IM once weekly for 6 weeks then inject 41ml IM monthly  . escitalopram (LEXAPRO) 20 MG tablet Take 1.5tablets po QD  . omeprazole (PRILOSEC) 20 MG capsule Take 1 capsule (20 mg total) by mouth 2 (two) times daily.  . Syringe/Needle, Disp, (SYRINGE 3CC/25GX1")  25G X 1" 3 ML MISC Use for IM b12 injections.  . tranexamic acid (LYSTEDA) 650 MG TABS tablet Take 2 tablets (1,300 mg total) by mouth 3 (three) times daily. Take during menses for a maximum of five days  . trimethoprim-polymyxin b (POLYTRIM) ophthalmic solution Place 1 drop into both eyes every 6 (six) hours for 7 days.  . valACYclovir (VALTREX) 1000 MG tablet Take 1 tablet (1,000 mg total) by mouth daily.  . fluconazole (DIFLUCAN) 150 MG tablet Take 1 tablet po once. May repeat dose in 3 days as needed for persistent symptoms. (Patient not taking: Reported on 02/13/2020)   No facility-administered encounter medications on file as of 02/13/2020.    Surgical History: Past Surgical History:  Procedure Laterality Date  . LAPAROSCOPIC GASTRIC SLEEVE RESECTION    . TONSILLECTOMY      Medical History: Past Medical History:  Diagnosis Date  . Anxiety   . Depression   . Genital herpes   . Sleep apnea     Family History: Family History  Problem Relation Age of Onset  . Heart attack Mother   . Diabetes Paternal Grandmother   . Diabetes Paternal Grandfather   . Colon cancer Father 61  . Breast cancer Neg Hx   . Ovarian cancer Neg Hx     Social History   Socioeconomic History  . Marital status: Single    Spouse name: Not on file  . Number of children: Not on file  . Years  of education: Not on file  . Highest education level: Not on file  Occupational History  . Not on file  Tobacco Use  . Smoking status: Current Every Day Smoker    Packs/day: 1.00  . Smokeless tobacco: Never Used  Vaping Use  . Vaping Use: Never used  Substance and Sexual Activity  . Alcohol use: Yes    Comment: occasionally  . Drug use: No  . Sexual activity: Not Currently    Birth control/protection: None  Other Topics Concern  . Not on file  Social History Narrative  . Not on file   Social Determinants of Health   Financial Resource Strain: Not on file  Food Insecurity: Not on file   Transportation Needs: Not on file  Physical Activity: Not on file  Stress: Not on file  Social Connections: Not on file  Intimate Partner Violence: Not on file      Review of Systems  Constitutional: Positive for fever. Negative for fatigue.  HENT: Positive for congestion, postnasal drip, rhinorrhea, sinus pressure and sinus pain. Negative for mouth sores.   Eyes: Positive for pain, discharge, redness and visual disturbance.       Eyes are red, swollen, painful, and have pus coming out  Respiratory: Negative for cough, shortness of breath and wheezing.   Cardiovascular: Negative for chest pain.  Genitourinary: Negative for flank pain.  Psychiatric/Behavioral: Negative.     Vital Signs: Temp 98.7 F (37.1 C)   Ht 5\' 9"  (1.753 m)   Wt 296 lb (134.3 kg)   BMI 43.71 kg/m    Observation/Objective:     Assessment/Plan: 1. Acute maxillary sinusitis, recurrence not specified Recommend she use OTC decongestant like mucinex. Will also start ABX. - azithromycin (ZITHROMAX) 250 MG tablet; Use as directed  Dispense: 6 tablet; Refill: 0  2. Acute bacterial conjunctivitis of both eyes Recommended pt use warm compresses over her eyes and begin eye drops. - trimethoprim-polymyxin b (POLYTRIM) ophthalmic solution; Place 1 drop into both eyes every 6 (six) hours for 7 days.  Dispense: 10 mL; Refill: 0  General Counseling: Erin Mcdonald verbalizes understanding of the findings of today's phone visit and agrees with plan of treatment. I have discussed any further diagnostic evaluation that may be needed or ordered today. We also reviewed her medications today. she has been encouraged to call the office with any questions or concerns that should arise related to todays visit.    No orders of the defined types were placed in this encounter.   Meds ordered this encounter  Medications  . azithromycin (ZITHROMAX) 250 MG tablet    Sig: Use as directed    Dispense:  6 tablet    Refill:  0  .  trimethoprim-polymyxin b (POLYTRIM) ophthalmic solution    Sig: Place 1 drop into both eyes every 6 (six) hours for 7 days.    Dispense:  10 mL    Refill:  0    Time spent: 66 Minutes    Dr Lavera Guise Internal medicine

## 2020-04-01 ENCOUNTER — Other Ambulatory Visit: Payer: Self-pay

## 2020-04-01 ENCOUNTER — Ambulatory Visit (INDEPENDENT_AMBULATORY_CARE_PROVIDER_SITE_OTHER): Payer: BC Managed Care – PPO | Admitting: Nurse Practitioner

## 2020-04-01 ENCOUNTER — Encounter: Payer: Self-pay | Admitting: Nurse Practitioner

## 2020-04-01 VITALS — BP 105/72 | HR 80 | Temp 98.1°F | Ht 69.0 in | Wt 305.9 lb

## 2020-04-01 DIAGNOSIS — K219 Gastro-esophageal reflux disease without esophagitis: Secondary | ICD-10-CM

## 2020-04-01 DIAGNOSIS — F411 Generalized anxiety disorder: Secondary | ICD-10-CM

## 2020-04-01 DIAGNOSIS — B009 Herpesviral infection, unspecified: Secondary | ICD-10-CM

## 2020-04-01 DIAGNOSIS — J301 Allergic rhinitis due to pollen: Secondary | ICD-10-CM

## 2020-04-01 DIAGNOSIS — R0683 Snoring: Secondary | ICD-10-CM

## 2020-04-01 DIAGNOSIS — R229 Localized swelling, mass and lump, unspecified: Secondary | ICD-10-CM | POA: Diagnosis not present

## 2020-04-01 DIAGNOSIS — Z7689 Persons encountering health services in other specified circumstances: Secondary | ICD-10-CM

## 2020-04-01 DIAGNOSIS — F321 Major depressive disorder, single episode, moderate: Secondary | ICD-10-CM

## 2020-04-01 MED ORDER — ESCITALOPRAM OXALATE 20 MG PO TABS: ORAL_TABLET | ORAL | 2 refills | Status: DC

## 2020-04-01 MED ORDER — FLUTICASONE PROPIONATE 50 MCG/ACT NA SUSP: 2.0000 | Freq: Every day | NASAL | 6 refills | Status: DC

## 2020-04-01 MED ORDER — VALACYCLOVIR HCL 1 G PO TABS: 1000.0000 mg | ORAL_TABLET | Freq: Every day | ORAL | 2 refills | Status: DC

## 2020-04-01 MED ORDER — OMEPRAZOLE 20 MG PO CPDR
20.0000 mg | DELAYED_RELEASE_CAPSULE | Freq: Two times a day (BID) | ORAL | 2 refills | Status: DC
Start: 2020-04-01 — End: 2020-06-17

## 2020-04-01 MED ORDER — MONTELUKAST SODIUM 10 MG PO TABS: 10.0000 mg | ORAL_TABLET | Freq: Every day | ORAL | 3 refills | Status: DC

## 2020-04-01 MED ORDER — ALPRAZOLAM 1 MG PO TABS
ORAL_TABLET | ORAL | 2 refills | Status: DC
Start: 2020-04-01 — End: 2020-06-17

## 2020-04-01 NOTE — Progress Notes (Signed)
New Patient Office Visit  Subjective:  Patient ID: Erin Mcdonald, female    DOB: [birth year]  Age: 45 y.o. MRN: [id number]  CC:  Chief Complaint  Patient presents with  . New Patient (Initial Visit)    HPI Erin Mcdonald presents to establish new primary care provider. She has changed office to keep continuity of care with her current pcp after she has changed practice settings. The patient reports increase in anxiety. She returned to her office after working from home for two years. First day back in the office was 03/29/2020. The patient is reporting panic type attacks. Spoke with her employer about this. Is not redy to report this as disability, but wanted employer to be aware. She is currently taking lexapro 30mg  daily and will take alprazolam 1mg  up to three times daily of needed for acute anxiety. She has seen psychiatry and counseling services in the past but has not found one that she connects with. We did discuss new referral to psychiatry. With new and worsening symptoms and higher doses of medication, this new referral is highly recommended for her.  She has noted fluid filled mass on the left forearm. It is non tender, soft, and has been getting larger.  Has noted weight gain over the past two years while at home. She has had lap band surgery to help with weight loss in the past. This has been largely unsuccessful.  She states that with weight gain, she has noted an increased amount of loud snoring. She frequently wakes up gasping for breath. Friends tell her she snores so loundly they cannot share a room with her. She has also been suffering from frequent sinus congestion.   Past Medical History:  Diagnosis Date  . Anxiety   . Depression   . Genital herpes   . Sleep apnea     Past Surgical History:  Procedure Laterality Date  . LAPAROSCOPIC GASTRIC SLEEVE RESECTION    . TONSILLECTOMY      Family History  Problem Relation Age of Onset  . Heart attack Mother   . Diabetes Paternal  Grandmother   . Diabetes Paternal Grandfather   . Colon cancer Father 3  . Breast cancer Neg Hx   . Ovarian cancer Neg Hx     Social History   Socioeconomic History  . Marital status: Single    Spouse name: Not on file  . Number of children: Not on file  . Years of education: Not on file  . Highest education level: Not on file  Occupational History  . Not on file  Tobacco Use  . Smoking status: Current Every Day Smoker    Packs/day: 1.00  . Smokeless tobacco: Never Used  Vaping Use  . Vaping Use: Never used  Substance and Sexual Activity  . Alcohol use: Yes    Comment: occasionally  . Drug use: No  . Sexual activity: Not Currently    Birth control/protection: None  Other Topics Concern  . Not on file  Social History Narrative  . Not on file   Social Determinants of Health   Financial Resource Strain: Not on file  Food Insecurity: Not on file  Transportation Needs: Not on file  Physical Activity: Not on file  Stress: Not on file  Social Connections: Not on file  Intimate Partner Violence: Not on file    ROS Review of Systems  Constitutional: Positive for unexpected weight change. Negative for chills and fatigue.       Ten pound  weight gain over last several months.   HENT: Positive for congestion, postnasal drip and rhinorrhea. Negative for sneezing and sore throat.        Chronic allergic rhinitis   Eyes: Positive for redness and itching.  Respiratory: Negative for cough, chest tightness, shortness of breath and wheezing.   Cardiovascular: Negative for chest pain and palpitations.  Gastrointestinal: Negative for abdominal pain, constipation, diarrhea, nausea and vomiting.       Chronic GERD   Endocrine: Negative.   Genitourinary: Negative.   Musculoskeletal: Negative for arthralgias, back pain, joint swelling and neck pain.       Has developed a soft, fluid filled mass on radial aspect of left wrist, non tender. Getting larger over time.   Skin: Negative  for rash.  Allergic/Immunologic: Positive for environmental allergies.  Neurological: Negative.  Negative for tremors and numbness.  Hematological: Negative for adenopathy. Does not bruise/bleed easily.  Psychiatric/Behavioral: Positive for dysphoric mood. Negative for behavioral problems (Depression), sleep disturbance and suicidal ideas. The patient is nervous/anxious.     Objective:   Today's Vitals   04/01/20 1014  BP: 105/72  Pulse: 80  Temp: 98.1 F (36.7 C)  SpO2: 96%  Weight: (!) 305 lb 14.4 oz (138.8 kg)  Height: 5\' 9"  (1.753 m)   Body mass index is 45.17 kg/m.    Physical Exam Vitals and nursing note reviewed.  Constitutional:      Appearance: Normal appearance. She is well-developed. She is obese.  HENT:     Head: Normocephalic.     Right Ear: Ear canal and external ear normal.     Left Ear: Ear canal and external ear normal.     Nose: Nose normal.  Eyes:     Conjunctiva/sclera: Conjunctivae normal.     Pupils: Pupils are equal, round, and reactive to light.  Cardiovascular:     Rate and Rhythm: Normal rate and regular rhythm.     Pulses: Normal pulses.     Heart sounds: Normal heart sounds.  Pulmonary:     Effort: Pulmonary effort is normal.     Breath sounds: Normal breath sounds.  Abdominal:     Palpations: Abdomen is soft.  Musculoskeletal:        General: Normal range of motion.     Left wrist: Deformity present.     Cervical back: Normal range of motion and neck supple.     Comments: There is area of soft tissue swelling or fluid filled cyst which is adjacent to the left radial wrist. It is non tender. Soft, smooth borders. Oval in shape. Measures about 7cm in length and 4cm in width. Non fixed in place.   Skin:    General: Skin is warm and dry.  Neurological:     General: No focal deficit present.     Mental Status: She is alert and oriented to person, place, and time.  Psychiatric:        Attention and Perception: Attention and perception  normal.        Mood and Affect: Affect normal. Mood is anxious.        Speech: Speech normal.        Behavior: Behavior normal. Behavior is cooperative.        Thought Content: Thought content normal.        Cognition and Memory: Cognition and memory normal.        Judgment: Judgment normal.    Assessment & Plan:  1. Encounter to establish care Appointment  today to establish new primary care office, keeping same provider for continuity of care.   2. Gastroesophageal reflux disease without esophagitis Continue omeprazole 20mg  up to twice daily for GERD.  - omeprazole (PRILOSEC) 20 MG capsule; Take 1 capsule (20 mg total) by mouth 2 (two) times daily.  Dispense: 60 capsule; Refill: 2  3. Local superficial swelling, mass or lump Will get ultrasound imaging of cyst-like mass adjacent to left radial wrist. Refer as indicated   4. Seasonal allergic rhinitis due to pollen Restart fluticasone nasal spray daily. Use otc claritin or zyrtec daily. Add back singulair 10mg  daily. Avoid triggers as best as possible  - montelukast (SINGULAIR) 10 MG tablet; Take 1 tablet (10 mg total) by mouth at bedtime.  Dispense: 30 tablet; Refill: 3 - fluticasone (FLONASE) 50 MCG/ACT nasal spray; Place 2 sprays into both nostrils daily.  Dispense: 16 g; Refill: 6  5. Loud snoring Patient reporting loud snoring and waking up gasping for air. Will get sleep study for further evaluation. Refer to sleep medicine specialist as indicated.  - PSG Sleep Study; Future  6. GAD (generalized anxiety disorder) Continue lexapro 30mg  daily. May continue alprazolam 1mg  up to three times daily. Advised patient a consult with psychiatry for more effective medication management was recommended. She has agreed to referral today.  - Ambulatory referral to Psychiatry - ALPRAZolam (XANAX) 1 MG tablet; Take 1 tablet po TID prn anxiety  Dispense: 90 tablet; Refill: 2  7. Depression, major, single episode, moderate ([place]) For now,  continue lexapro 30mg  daily. New referral to psychiatry made for further evaluation and treatment of GAD and depression.  - Ambulatory referral to Psychiatry - escitalopram (LEXAPRO) 20 MG tablet; Take 1.5tablets po QD  Dispense: 45 tablet; Refill: 2  8. Herpes simplex disease Continue valacyclovir daily to prevent flares.  - valACYclovir (VALTREX) 1000 MG tablet; Take 1 tablet (1,000 mg total) by mouth daily.  Dispense: 30 tablet; Refill: 2   Problem List Items Addressed This Visit      Respiratory   Seasonal allergic rhinitis due to pollen   Relevant Medications   montelukast (SINGULAIR) 10 MG tablet   fluticasone (FLONASE) 50 MCG/ACT nasal spray     Digestive   Gastroesophageal reflux disease   Relevant Medications   omeprazole (PRILOSEC) 20 MG capsule     Other   Herpes simplex disease   Relevant Medications   valACYclovir (VALTREX) 1000 MG tablet   GAD (generalized anxiety disorder)   Relevant Medications   ALPRAZolam (XANAX) 1 MG tablet   escitalopram (LEXAPRO) 20 MG tablet   Other Relevant Orders   Ambulatory referral to Psychiatry   Depression, major, single episode, moderate ([place])   Relevant Medications   ALPRAZolam (XANAX) 1 MG tablet   escitalopram (LEXAPRO) 20 MG tablet   Other Relevant Orders   Ambulatory referral to Psychiatry   Encounter to establish care - Primary   Local superficial swelling, mass or lump   Loud snoring   Relevant Orders   PSG Sleep Study      Outpatient Encounter Medications as of 04/01/2020  Medication Sig  . fluticasone (FLONASE) 50 MCG/ACT nasal spray Place 2 sprays into both nostrils daily.  . montelukast (SINGULAIR) 10 MG tablet Take 1 tablet (10 mg total) by mouth at bedtime.  . ALPRAZolam (XANAX) 1 MG tablet Take 1 tablet po TID prn anxiety  . azithromycin (ZITHROMAX) 250 MG tablet Use as directed  . cyanocobalamin (,VITAMIN B-12,) 1000 MCG/ML injection Inject 78ml IM once  weekly for 6 weeks then inject 7ml IM monthly  .  escitalopram (LEXAPRO) 20 MG tablet Take 1.5tablets po QD  . fluconazole (DIFLUCAN) 150 MG tablet Take 1 tablet po once. May repeat dose in 3 days as needed for persistent symptoms. (Patient not taking: Reported on 02/13/2020)  . omeprazole (PRILOSEC) 20 MG capsule Take 1 capsule (20 mg total) by mouth 2 (two) times daily.  . Syringe/Needle, Disp, (SYRINGE 3CC/25GX1") 25G X 1" 3 ML MISC Use for IM b12 injections.  . tranexamic acid (LYSTEDA) 650 MG TABS tablet Take 2 tablets (1,300 mg total) by mouth 3 (three) times daily. Take during menses for a maximum of five days  . valACYclovir (VALTREX) 1000 MG tablet Take 1 tablet (1,000 mg total) by mouth daily.  . [DISCONTINUED] ALPRAZolam (XANAX) 1 MG tablet Take 1 tablet po TID prn anxiety  . [DISCONTINUED] escitalopram (LEXAPRO) 20 MG tablet Take 1.5tablets po QD  . [DISCONTINUED] omeprazole (PRILOSEC) 20 MG capsule Take 1 capsule (20 mg total) by mouth 2 (two) times daily.  . [DISCONTINUED] valACYclovir (VALTREX) 1000 MG tablet Take 1 tablet (1,000 mg total) by mouth daily.   No facility-administered encounter medications on file as of 04/01/2020.   Time spent with the patient was approximately 45 minutes. This time included reviewing progress notes, labs, imaging studies, and discussing plan for follow up.   Follow-up: Return in about 3 months (around 07/02/2020) for routine - needs FBW with B12, folate, Free T4, and Vitamin D - about one week before next visit. Marland Kitchen   [name], NP

## 2020-04-01 NOTE — Patient Instructions (Signed)
Allergic Rhinitis, Adult Allergic rhinitis is a reaction to allergens. Allergens are things that can cause an allergic reaction. This condition affects the lining inside the nose (mucous membrane). There are two types of allergic rhinitis:  Seasonal. This type is also called hay fever. It happens only during some times of the year.  Perennial. This type can happen at any time of the year. This condition cannot be spread from person to person (is not contagious). It can be mild, worse, or very bad. It can develop at any age and may be outgrown. What are the causes? This condition may be caused by:  Pollen from grasses, trees, and weeds.  Dust mites.  Smoke.  Mold.  Car fumes.  The pee (urine), spit, or dander of pets. Dander is dead skin cells from a pet.   What increases the risk? You are more likely to develop this condition if:  You have allergies in your family.  You have problems like allergies in your family. You may have: ? Swelling of parts of your eyes and eyelids. ? Asthma. This affects how you breathe. ? Long-term redness and swelling on your skin. ? Food allergies. What are the signs or symptoms? The main symptom of this condition is a runny or stuffy nose (nasal congestion). Other symptoms may include:  Sneezing or coughing.  Itching and tearing of your eyes.  Mucus that drips down the back of your throat (postnasal drip).  Trouble sleeping.  Feeling tired.  Headache.  Sore throat. How is this treated? There is no cure for this condition. You should avoid things that you are allergic to. Treatment can help to relieve symptoms. This may include:  Medicines that block allergy symptoms, such as corticosteroids or antihistamines. These may be given as a shot, nasal spray, or pill.  Avoiding things you are allergic to.  Medicines that give you bits of what you are allergic to over time. This is called immunotherapy. It is done if other treatments do not  help. You may get: ? Shots. ? Medicine under your tongue.  Stronger medicines, if other treatments do not help. Follow these instructions at home: Avoiding allergens Find out what things you are allergic to and avoid them. To do this, try these things:  If you get allergies any time of year: ? Replace carpet with wood, tile, or vinyl flooring. Carpet can trap pet dander and dust. ? Do not smoke. Do not allow smoking in your home. ? Change your heating and air conditioning filters at least once a month.  If you get allergies only some times of the year: ? Keep windows closed when you can. ? Plan things to do outside when pollen counts are lowest. Check pollen counts before you plan things to do outside. ? When you come indoors, change your clothes and shower before you sit on furniture or bedding.   If you are allergic to a pet: ? Keep the pet out of your bedroom. ? Vacuum, sweep, and dust often.   General instructions  Take over-the-counter and prescription medicines only as told by your doctor.  Drink enough fluid to keep your pee (urine) pale yellow.  Keep all follow-up visits as told by your doctor. This is important. Where to find more information  American Academy of Allergy, Asthma & Immunology: www.aaaai.org Contact a doctor if:  You have a fever.  You get a cough that does not go away.  You make whistling sounds when you breathe (wheeze).  Your   symptoms slow you down.  Your symptoms stop you from doing your normal things each day. Get help right away if:  You are short of breath. This symptom may be an emergency. Do not wait to see if the symptom will go away. Get medical help right away. Call your local emergency services (911 in the [place] Do not drive yourself to the hospital. Summary  Allergic rhinitis may be treated by taking medicines and avoiding things you are allergic to.  If you have allergies only some of the year, keep windows closed when you  can at those times.  Contact your doctor if you get a fever or a cough that does not go away. This information is not intended to replace advice given to you by your health care provider. Make sure you discuss any questions you have with your health care provider. Document Revised: 02/17/2019 Document Reviewed: 12/24/2018 Elsevier Patient Education  2021 [place].  http://NIMH.NIH.Gov">  Generalized Anxiety Disorder, Adult Generalized anxiety disorder (GAD) is a mental health condition. Unlike normal worries, anxiety related to GAD is not triggered by a specific event. These worries do not fade or get better with time. GAD interferes with relationships, work, and school. GAD symptoms can vary from mild to severe. People with severe GAD can have intense waves of anxiety with physical symptoms that are similar to panic attacks. What are the causes? The exact cause of GAD is not known, but the following are believed to have an impact:  Differences in natural brain chemicals.  Genes passed down from parents to children.  Differences in the way threats are perceived.  Development during childhood.  Personality. What increases the risk? The following factors may make you more likely to develop this condition:  Being female.  Having a family history of anxiety disorders.  Being very shy.  Experiencing very stressful life events, such as the death of a loved one.  Having a very stressful family environment. What are the signs or symptoms? People with GAD often worry excessively about many things in their lives, such as their health and family. Symptoms may also include:  Mental and emotional symptoms: ? Worrying excessively about natural disasters. ? Fear of being late. ? Difficulty concentrating. ? Fears that others are judging your performance.  Physical symptoms: ? Fatigue. ? Headaches, muscle tension, muscle twitches, trembling, or feeling shaky. ? Feeling like your  heart is pounding or beating very fast. ? Feeling out of breath or like you cannot take a deep breath. ? Having trouble falling asleep or staying asleep, or experiencing restlessness. ? Sweating. ? Nausea, diarrhea, or irritable bowel syndrome (IBS).  Behavioral symptoms: ? Experiencing erratic moods or irritability. ? Avoidance of new situations. ? Avoidance of people. ? Extreme difficulty making decisions. How is this diagnosed? This condition is diagnosed based on your symptoms and medical history. You will also have a physical exam. Your health care provider may perform tests to rule out other possible causes of your symptoms. To be diagnosed with GAD, a person must have anxiety that:  Is out of his or her control.  Affects several different aspects of his or her life, such as work and relationships.  Causes distress that makes him or her unable to take part in normal activities.  Includes at least three symptoms of GAD, such as restlessness, fatigue, trouble concentrating, irritability, muscle tension, or sleep problems. Before your health care provider can confirm a diagnosis of GAD, these symptoms must be present more days  than they are not, and they must last for 6 months or longer. How is this treated? This condition may be treated with:  Medicine. Antidepressant medicine is usually prescribed for long-term daily control. Anti-anxiety medicines may be added in severe cases, especially when panic attacks occur.  Talk therapy (psychotherapy). Certain types of talk therapy can be helpful in treating GAD by providing support, education, and guidance. Options include: ? Cognitive behavioral therapy (CBT). People learn coping skills and self-calming techniques to ease their physical symptoms. They learn to identify unrealistic thoughts and behaviors and to replace them with more appropriate thoughts and behaviors. ? Acceptance and commitment therapy (ACT). This treatment teaches  people how to be mindful as a way to cope with unwanted thoughts and feelings. ? Biofeedback. This process trains you to manage your body's response (physiological response) through breathing techniques and relaxation methods. You will work with a therapist while machines are used to monitor your physical symptoms.  Stress management techniques. These include yoga, meditation, and exercise. A mental health specialist can help determine which treatment is best for you. Some people see improvement with one type of therapy. However, other people require a combination of therapies.   Follow these instructions at home: Lifestyle  Maintain a consistent routine and schedule.  Anticipate stressful situations. Create a plan, and allow extra time to work with your plan.  Practice stress management or self-calming techniques that you have learned from your therapist or your health care provider. General instructions  Take over-the-counter and prescription medicines only as told by your health care provider.  Understand that you are likely to have setbacks. Accept this and be kind to yourself as you persist to take better care of yourself.  Recognize and accept your accomplishments, even if you judge them as small.  Keep all follow-up visits as told by your health care provider. This is important. Contact a health care provider if:  Your symptoms do not get better.  Your symptoms get worse.  You have signs of depression, such as: ? A persistently sad or irritable mood. ? Loss of enjoyment in activities that used to bring you joy. ? Change in weight or eating. ? Changes in sleeping habits. ? Avoiding friends or family members. ? Loss of energy for normal tasks. ? Feelings of guilt or worthlessness. Get help right away if:  You have serious thoughts about hurting yourself or others. If you ever feel like you may hurt yourself or others, or have thoughts about taking your own life, get help  right away. Go to your nearest emergency department or:  Call your local emergency services (911 in the [place]  Call a suicide crisis helpline, such as the [place] at 9092671178. This is open 24 hours a day in the [place]  Text the Crisis Text Line at 5141638838 (in the [place].). Summary  Generalized anxiety disorder (GAD) is a mental health condition that involves worry that is not triggered by a specific event.  People with GAD often worry excessively about many things in their lives, such as their health and family.  GAD may cause symptoms such as restlessness, trouble concentrating, sleep problems, frequent sweating, nausea, diarrhea, headaches, and trembling or muscle twitching.  A mental health specialist can help determine which treatment is best for you. Some people see improvement with one type of therapy. However, other people require a combination of therapies. This information is not intended to replace advice given to you by your health care provider.  Make sure you discuss any questions you have with your health care provider. Document Revised: 10/16/2018 Document Reviewed: 10/16/2018 Elsevier Patient Education  [place].  [email or web address]">  Panic Attack A panic attack is when you suddenly feel very afraid, uncomfortable, or nervous (anxious). A panic attack can happen when you are scared or for no reason. A panic attack can feel like a serious problem. It can even feel like a heart attack or stroke. See your doctor when you have a panic attack to make sure you do not have a serious problem. Follow these instructions at home:  Take medicines only as told by your doctor.  If you feel worried or nervous, try not to have caffeine.  Take good care of your health. To do this: ? Eat healthy. Make sure to eat fresh fruits and vegetables, whole grains, lean meats, and low-fat dairy. ? Get enough  sleep. Try to sleep for 7-8 hours each night. ? Exercise. Try to be active for 30 minutes 5 or more days a week. ? Do not smoke. Talk to your doctor if you need help quitting. ? Limit how much alcohol you drink:  If you are a woman who is not pregnant: try not to have more than 1 drink a day.  If you are a man: try not to have more than 2 drinks a day.  One drink equals 12 oz of beer, 5 oz of wine, or 1 oz of hard liquor.  Keep all follow-up visits as told by your doctor. This is important.   Contact a doctor if:  Your symptoms do not get better.  Your symptoms get worse.  You are not able to take your medicines as told. Get help right away if:  You have thoughts of hurting yourself or others.  You have symptoms of a panic attack. Do not drive yourself to the hospital. Have someone else drive you or call an ambulance. If you feel like you may hurt yourself or others, or have thoughts about taking your own life, get help right away. You can go to your nearest emergency department or call:  Your local emergency services (911 in the [place]  A suicide crisis helpline, such as the [facility] at 952-023-6960. This is open 24 hours a day. Summary  A panic attack is when you suddenly feel very afraid, uncomfortable, or nervous (anxious).  See your doctor when you have a panic attack to make sure that you do not have another serious problem.  If you feel like you may hurt yourself or others, get help right away by calling 911. This information is not intended to replace advice given to you by your health care provider. Make sure you discuss any questions you have with your health care provider. Document Revised: 06/26/2019 Document Reviewed: 06/26/2019 Elsevier Patient Education  [place].

## 2020-04-02 ENCOUNTER — Encounter: Payer: Self-pay | Admitting: Nurse Practitioner

## 2020-04-02 DIAGNOSIS — R229 Localized swelling, mass and lump, unspecified: Secondary | ICD-10-CM

## 2020-04-05 NOTE — Telephone Encounter (Signed)
To change the location of an imaging study, do I need to cancel the order and make a new one? Or can someone call to change the location?

## 2020-04-06 DIAGNOSIS — J301 Allergic rhinitis due to pollen: Secondary | ICD-10-CM | POA: Insufficient documentation

## 2020-04-06 DIAGNOSIS — Z7689 Persons encountering health services in other specified circumstances: Secondary | ICD-10-CM | POA: Insufficient documentation

## 2020-04-06 DIAGNOSIS — R229 Localized swelling, mass and lump, unspecified: Secondary | ICD-10-CM | POA: Insufficient documentation

## 2020-04-06 DIAGNOSIS — R0683 Snoring: Secondary | ICD-10-CM | POA: Insufficient documentation

## 2020-04-12 NOTE — Telephone Encounter (Signed)
Hey. The patient asked me again about what I need to do to change the location of her arm ultrasound so that it is in [place]. I also saw her sleep study was approved. Do you know how that gets scheduled? Thanks for checking.  hb

## 2020-04-20 ENCOUNTER — Other Ambulatory Visit: Payer: Self-pay

## 2020-04-20 ENCOUNTER — Ambulatory Visit (HOSPITAL_BASED_OUTPATIENT_CLINIC_OR_DEPARTMENT_OTHER)
Admission: RE | Admit: 2020-04-20 | Discharge: 2020-04-20 | Disposition: A | Discharge status: Home / Self Care | Payer: BC Managed Care – PPO | Source: Ambulatory Visit | Attending: Nurse Practitioner | Admitting: Nurse Practitioner

## 2020-04-20 DIAGNOSIS — R229 Localized swelling, mass and lump, unspecified: Secondary | ICD-10-CM | POA: Insufficient documentation

## 2020-04-20 DIAGNOSIS — M7989 Other specified soft tissue disorders: Secondary | ICD-10-CM | POA: Diagnosis not present

## 2020-04-21 ENCOUNTER — Encounter: Payer: BC Managed Care – PPO | Admitting: Hospice and Palliative Medicine

## 2020-04-25 ENCOUNTER — Encounter: Payer: Self-pay | Admitting: Nurse Practitioner

## 2020-04-25 DIAGNOSIS — R229 Localized swelling, mass and lump, unspecified: Secondary | ICD-10-CM

## 2020-04-25 NOTE — Progress Notes (Signed)
May refer to surgery if bothersome.

## 2020-04-26 ENCOUNTER — Encounter: Payer: Self-pay | Admitting: Nurse Practitioner

## 2020-05-03 ENCOUNTER — Other Ambulatory Visit: Payer: Self-pay

## 2020-05-03 ENCOUNTER — Encounter: Payer: Self-pay | Admitting: Surgery

## 2020-05-03 ENCOUNTER — Ambulatory Visit (INDEPENDENT_AMBULATORY_CARE_PROVIDER_SITE_OTHER): Payer: BC Managed Care – PPO | Admitting: Surgery

## 2020-05-03 VITALS — BP 119/71 | HR 83 | Temp 98.2°F | Ht 69.5 in | Wt 294.0 lb

## 2020-05-03 DIAGNOSIS — M25532 Pain in left wrist: Secondary | ICD-10-CM | POA: Diagnosis not present

## 2020-05-03 DIAGNOSIS — R2232 Localized swelling, mass and lump, left upper limb: Secondary | ICD-10-CM | POA: Diagnosis not present

## 2020-05-03 NOTE — Patient Instructions (Signed)
We have sent a referral to Dr Claudia Desanctis. Their office will call you to schedule this appointment.

## 2020-05-05 ENCOUNTER — Encounter: Payer: Self-pay | Admitting: Surgery

## 2020-05-05 NOTE — Progress Notes (Signed)
Patient ID: [name], female   DOB: [birth year], 45 y.o.   MRN: [id number]  HPI Erin Mcdonald is a 45 y.o. female seen in consultation at the request of [name] NP.  Does have a history of a left wrist subcutaneous mass.  She reports that she has had it for several months now.  She experiences intermittent pain and discomfort that is mild to moderate intensity sharp in nature.  There is no necessarily any specific alleviating or aggravating factors.  Of note she also feels paresthesias on her left first finger both in the dorsum and palmer aspect.  No evidence of motor deficits. Of note she did have a remote history of a gastric sleeve C. Did have an ultrasound that I have personally reviewed no evidence of collections or cystic mass.  Last CBC and CMP was unremarkable.  He is able to perform more than 4 METS of activity without any shortness of breath or chest pain.  She does have a history of sleep apnea.  She does have a significant history of anxiety and depression.  HPI  Past Medical History:  Diagnosis Date  . Anxiety   . Depression   . Genital herpes   . Sleep apnea     Past Surgical History:  Procedure Laterality Date  . LAPAROSCOPIC GASTRIC SLEEVE RESECTION  11/15/2012   [facility], Dr Leda Gauze  . TONSILLECTOMY  2013    Family History  Problem Relation Age of Onset  . Heart attack Mother   . Diabetes Paternal Grandmother   . Diabetes Paternal Grandfather   . Colon cancer Father 5  . Breast cancer Neg Hx   . Ovarian cancer Neg Hx     Social History Social History   Tobacco Use  . Smoking status: Current Every Day Smoker    Packs/day: 1.00  . Smokeless tobacco: Never Used  Vaping Use  . Vaping Use: Never used  Substance Use Topics  . Alcohol use: Yes    Comment: occasionally  . Drug use: No    Allergies  Allergen Reactions  . Codeine Other (See Comments)    Cause pt to be hyper    Current Outpatient Medications  Medication Sig Dispense Refill  . ALPRAZolam  (XANAX) 1 MG tablet Take 1 tablet po TID prn anxiety 90 tablet 2  . cyanocobalamin (,VITAMIN B-12,) 1000 MCG/ML injection Inject 22ml IM once weekly for 6 weeks then inject 15ml IM monthly 10 mL 1  . escitalopram (LEXAPRO) 20 MG tablet Take 1.5tablets po QD 45 tablet 2  . fluticasone (FLONASE) 50 MCG/ACT nasal spray Place 2 sprays into both nostrils daily. 16 g 6  . montelukast (SINGULAIR) 10 MG tablet Take 1 tablet (10 mg total) by mouth at bedtime. 30 tablet 3  . omeprazole (PRILOSEC) 20 MG capsule Take 1 capsule (20 mg total) by mouth 2 (two) times daily. 60 capsule 2  . Syringe/Needle, Disp, (SYRINGE 3CC/25GX1") 25G X 1" 3 ML MISC Use for IM b12 injections. 4 each 5  . tranexamic acid (LYSTEDA) 650 MG TABS tablet Take 2 tablets (1,300 mg total) by mouth 3 (three) times daily. Take during menses for a maximum of five days 30 tablet 2  . valACYclovir (VALTREX) 1000 MG tablet Take 1 tablet (1,000 mg total) by mouth daily. 30 tablet 2   No current facility-administered medications for this visit.     Review of Systems Full ROS  was asked and was negative except for the information on the HPI  Physical Exam Blood pressure 119/71, pulse 83, temperature 98.2 F (36.8 C), height 5' 9.5" (1.765 m), weight 133.4 kg, last menstrual period 04/02/2020, SpO2 96 %. CONSTITUTIONAL: NAD, BMI 42 EYES: Pupils are equal, round, , Sclera are non-icteric. EARS, NOSE, MOUTH AND THROAT: She is wearing a mask hearing is intact to voice. LYMPH NODES:  Lymph nodes in the neck are normal. RESPIRATORY:  Lungs are clear. There is normal respiratory effort, with equal breath sounds bilaterally, and without pathologic use of accessory muscles. CARDIOVASCULAR: Heart is regular without murmurs, gallops, or rubs. GI: The abdomen is  soft, nontender, and nondistended. There are no palpable masses. There is no hepatosplenomegaly. There are normal bowel sounds in all quadrants. GU: Rectal deferred.   MUSCULOSKELETAL: Normal  muscle strength and tone. No cyanosis or edema.   There is evidence of 3 x 2 cm soft tissue mass located proximal to the left wrist on the dorsal and medial aspect.  This is mobile and is in very close proximity to the wrist.  These have the characteristics of a lipoma but I cannot rule out the ganglion cyst SKIN: Turgor is good and there are no pathologic skin lesions or ulcers. NEUROLOGIC: Motor and sensation is grossly normal. Cranial nerves are grossly intact.  No evidence of neurological deficit on the left and or right hand.  Motor strength is equal in upper extremities and both hands.  There is no obvious evidence of sensory deficit on the left hand. PSYCH:  Oriented to person, place and time. Affect is normal.  Data Reviewed  I have personally reviewed the patient's imaging, laboratory findings and medical records.    Assessment/Plan 45 year old female with a symptomatic left breast soft tissue mass.  Differential diagnosis will include ganglion cyst versus lipoma.  Given its proximity to the wrist and the possibility of this being a ganglion cyst I do think that this is better managed by hand surgery.  I will make the referral to Dr. Claudia Desanctis with one of our hand surgeons in [place].  I also let him determine if any additional imaging studies are required. I had an extensive discussion with patient regarding her disease process my rationale for recommendations. He understands and is very [employer] attorney.  Caroleen Hamman, MD FACS General Surgeon 05/05/2020, 3:03 PM

## 2020-05-06 ENCOUNTER — Ambulatory Visit (INDEPENDENT_AMBULATORY_CARE_PROVIDER_SITE_OTHER): Payer: BC Managed Care – PPO | Admitting: Nurse Practitioner

## 2020-05-06 ENCOUNTER — Encounter: Payer: Self-pay | Admitting: Nurse Practitioner

## 2020-05-06 VITALS — Ht 69.0 in | Wt 294.0 lb

## 2020-05-06 DIAGNOSIS — J301 Allergic rhinitis due to pollen: Secondary | ICD-10-CM | POA: Diagnosis not present

## 2020-05-06 DIAGNOSIS — J0141 Acute recurrent pansinusitis: Secondary | ICD-10-CM | POA: Insufficient documentation

## 2020-05-06 DIAGNOSIS — J0140 Acute pansinusitis, unspecified: Secondary | ICD-10-CM | POA: Diagnosis not present

## 2020-05-06 MED ORDER — AZELASTINE HCL 0.1 % NA SOLN: 2.0000 | Freq: Two times a day (BID) | NASAL | 5 refills | Status: DC

## 2020-05-06 MED ORDER — CEFUROXIME AXETIL 500 MG PO TABS: 500.0000 mg | ORAL_TABLET | Freq: Two times a day (BID) | ORAL | 0 refills | Status: DC

## 2020-05-06 NOTE — Progress Notes (Signed)
Virtual Visit via Telephone Note  I connected with Erin Mcdonald on 05/06/20 at  1:50 PM EDT by telephone and verified that I am speaking with the correct person using two identifiers.  Location: Patient: home  Provider: [place] [facility] at [facility].    I discussed the limitations, risks, security and privacy concerns of performing an evaluation and management service by telephone and the availability of in person appointments. I also discussed with the patient that there may be a patient responsible charge related to this service. The patient expressed understanding and agreed to proceed.   History of Present Illness: The patient presents for evaluation of nasal and sinus congestion. She states that she has burning in her nasal passages. She states that her teeth hurt and she has tenderness in her jaw. She states that she does have a sinus headache. She has been taking tylenol which helps for a short period of time. She continues to take her allergra daily and uses her flonase as prescribed. States that she has not started her singulair, as she is afraid that it will make her sleepy. She states that these new symptoms have been going on for about two weeks and they are gradually getting worse.    Observations/Objective:  The patient is alert and oriented. She is pleasant and answers all questions appropriately. Breathing is non-labored. She is in no acute distress at this time. She sounds nasally congested   Assessment and Plan: 1. Acute non-recurrent pansinusitis Start ceftin 500mg  twice daily for 7 days. Continue to take OTC medications as needed and as indicated to improve acute symptoms.  - cefUROXime (CEFTIN) 500 MG tablet; Take 1 tablet (500 mg total) by mouth 2 (two) times daily with a meal.  Dispense: 14 tablet; Refill: 0  2. Seasonal allergic rhinitis due to pollen Add Astelin nasal spray. Use 2 sprays in both nostrils twice daily. Continue to take allegra and use flonase  nasal spray every day. Recommended she start use of singulair everyday.  - azelastine (ASTELIN) 0.1 % nasal spray; Place 2 sprays into both nostrils 2 (two) times daily. Use in each nostril as directed  Dispense: 30 mL; Refill: 5  Follow Up Instructions:    I discussed the assessment and treatment plan with the patient. The patient was provided an opportunity to ask questions and all were answered. The patient agreed with the plan and demonstrated an understanding of the instructions.   The patient was advised to call back or seek an in-person evaluation if the symptoms worsen or if the condition fails to improve as anticipated.  I provided 10 minutes of non-face-to-face time during this encounter.   Erin Freshwater, NP

## 2020-05-06 NOTE — Patient Instructions (Signed)
Allergic Rhinitis, Adult Allergic rhinitis is a reaction to allergens. Allergens are things that can cause an allergic reaction. This condition affects the lining inside the nose (mucous membrane). There are two types of allergic rhinitis:  Seasonal. This type is also called hay fever. It happens only during some times of the year.  Perennial. This type can happen at any time of the year. This condition cannot be spread from person to person (is not contagious). It can be mild, worse, or very bad. It can develop at any age and may be outgrown. What are the causes? This condition may be caused by:  Pollen from grasses, trees, and weeds.  Dust mites.  Smoke.  Mold.  Car fumes.  The pee (urine), spit, or dander of pets. Dander is dead skin cells from a pet.   What increases the risk? You are more likely to develop this condition if:  You have allergies in your family.  You have problems like allergies in your family. You may have: ? Swelling of parts of your eyes and eyelids. ? Asthma. This affects how you breathe. ? Long-term redness and swelling on your skin. ? Food allergies. What are the signs or symptoms? The main symptom of this condition is a runny or stuffy nose (nasal congestion). Other symptoms may include:  Sneezing or coughing.  Itching and tearing of your eyes.  Mucus that drips down the back of your throat (postnasal drip).  Trouble sleeping.  Feeling tired.  Headache.  Sore throat. How is this treated? There is no cure for this condition. You should avoid things that you are allergic to. Treatment can help to relieve symptoms. This may include:  Medicines that block allergy symptoms, such as corticosteroids or antihistamines. These may be given as a shot, nasal spray, or pill.  Avoiding things you are allergic to.  Medicines that give you bits of what you are allergic to over time. This is called immunotherapy. It is done if other treatments do not  help. You may get: ? Shots. ? Medicine under your tongue.  Stronger medicines, if other treatments do not help. Follow these instructions at home: Avoiding allergens Find out what things you are allergic to and avoid them. To do this, try these things:  If you get allergies any time of year: ? Replace carpet with wood, tile, or vinyl flooring. Carpet can trap pet dander and dust. ? Do not smoke. Do not allow smoking in your home. ? Change your heating and air conditioning filters at least once a month.  If you get allergies only some times of the year: ? Keep windows closed when you can. ? Plan things to do outside when pollen counts are lowest. Check pollen counts before you plan things to do outside. ? When you come indoors, change your clothes and shower before you sit on furniture or bedding.   If you are allergic to a pet: ? Keep the pet out of your bedroom. ? Vacuum, sweep, and dust often.   General instructions  Take over-the-counter and prescription medicines only as told by your doctor.  Drink enough fluid to keep your pee (urine) pale yellow.  Keep all follow-up visits as told by your doctor. This is important. Where to find more information  American Academy of Allergy, Asthma & Immunology: www.aaaai.org Contact a doctor if:  You have a fever.  You get a cough that does not go away.  You make whistling sounds when you breathe (wheeze).  Your   symptoms slow you down.  Your symptoms stop you from doing your normal things each day. Get help right away if:  You are short of breath. This symptom may be an emergency. Do not wait to see if the symptom will go away. Get medical help right away. Call your local emergency services (911 in the [place] Do not drive yourself to the hospital. Summary  Allergic rhinitis may be treated by taking medicines and avoiding things you are allergic to.  If you have allergies only some of the year, keep windows closed when you  can at those times.  Contact your doctor if you get a fever or a cough that does not go away. This information is not intended to replace advice given to you by your health care provider. Make sure you discuss any questions you have with your health care provider. Document Revised: 02/17/2019 Document Reviewed: 12/24/2018 Elsevier Patient Education  2021 [place].  Sinusitis, Adult Sinusitis is soreness and swelling (inflammation) of your sinuses. Sinuses are hollow spaces in the bones around your face. They are located:  Around your eyes.  In the middle of your forehead.  Behind your nose.  In your cheekbones. Your sinuses and nasal passages are lined with a fluid called mucus. Mucus drains out of your sinuses. Swelling can trap mucus in your sinuses. This lets germs (bacteria, virus, or fungus) grow, which leads to infection. Most of the time, this condition is caused by a virus. What are the causes? This condition is caused by:  Allergies.  Asthma.  Germs.  Things that block your nose or sinuses.  Growths in the nose (nasal polyps).  Chemicals or irritants in the air.  Fungus (rare). What increases the risk? You are more likely to develop this condition if:  You have a weak body defense system (immune system).  You do a lot of swimming or diving.  You use nasal sprays too much.  You smoke. What are the signs or symptoms? The main symptoms of this condition are pain and a feeling of pressure around the sinuses. Other symptoms include:  Stuffy nose (congestion).  Runny nose (drainage).  Swelling and warmth in the sinuses.  Headache.  Toothache.  A cough that may get worse at night.  Mucus that collects in the throat or the back of the nose (postnasal drip).  Being unable to smell and taste.  Being very tired (fatigue).  A fever.  Sore throat.  Bad breath. How is this diagnosed? This condition is diagnosed based on:  Your symptoms.  Your  medical history.  A physical exam.  Tests to find out if your condition is short-term (acute) or long-term (chronic). Your doctor may: ? Check your nose for growths (polyps). ? Check your sinuses using a tool that has a light (endoscope). ? Check for allergies or germs. ? Do imaging tests, such as an MRI or CT scan. How is this treated? Treatment for this condition depends on the cause and whether it is short-term or long-term.  If caused by a virus, your symptoms should go away on their own within 10 days. You may be given medicines to relieve symptoms. They include: ? Medicines that shrink swollen tissue in the nose. ? Medicines that treat allergies (antihistamines). ? A spray that treats swelling of the nostrils. ? Rinses that help get rid of thick mucus in your nose (nasal saline washes).  If caused by bacteria, your doctor may wait to see if you will get better  without treatment. You may be given antibiotic medicine if you have: ? A very bad infection. ? A weak body defense system.  If caused by growths in the nose, you may need to have surgery. Follow these instructions at home: Medicines  Take, use, or apply over-the-counter and prescription medicines only as told by your doctor. These may include nasal sprays.  If you were prescribed an antibiotic medicine, take it as told by your doctor. Do not stop taking the antibiotic even if you start to feel better. Hydrate and humidify  Drink enough water to keep your pee (urine) pale yellow.  Use a cool mist humidifier to keep the humidity level in your home above 50%.  Breathe in steam for 10-15 minutes, 3-4 times a day, or as told by your doctor. You can do this in the bathroom while a hot shower is running.  Try not to spend time in cool or dry air.   Rest  Rest as much as you can.  Sleep with your head raised (elevated).  Make sure you get enough sleep each night. General instructions  Put a warm, moist washcloth on  your face 3-4 times a day, or as often as told by your doctor. This will help with discomfort.  Wash your hands often with soap and water. If there is no soap and water, use hand sanitizer.  Do not smoke. Avoid being around people who are smoking (secondhand smoke).  Keep all follow-up visits as told by your doctor. This is important.   Contact a doctor if:  You have a fever.  Your symptoms get worse.  Your symptoms do not get better within 10 days. Get help right away if:  You have a very bad headache.  You cannot stop throwing up (vomiting).  You have very bad pain or swelling around your face or eyes.  You have trouble seeing.  You feel confused.  Your neck is stiff.  You have trouble breathing. Summary  Sinusitis is swelling of your sinuses. Sinuses are hollow spaces in the bones around your face.  This condition is caused by tissues in your nose that become inflamed or swollen. This traps germs. These can lead to infection.  If you were prescribed an antibiotic medicine, take it as told by your doctor. Do not stop taking it even if you start to feel better.  Keep all follow-up visits as told by your doctor. This is important. This information is not intended to replace advice given to you by your health care provider. Make sure you discuss any questions you have with your health care provider. Document Revised: 05/28/2017 Document Reviewed: 05/28/2017 Elsevier Patient Education  2021 [employer].

## 2020-05-17 ENCOUNTER — Other Ambulatory Visit: Payer: Self-pay | Admitting: Nurse Practitioner

## 2020-05-17 ENCOUNTER — Encounter: Payer: Self-pay | Admitting: Nurse Practitioner

## 2020-05-17 DIAGNOSIS — R0683 Snoring: Secondary | ICD-10-CM

## 2020-05-19 ENCOUNTER — Telehealth: Payer: Self-pay | Admitting: Nurse Practitioner

## 2020-05-19 NOTE — Telephone Encounter (Signed)
Called patient to advise that the FBW was to be scheduled before her CPE- patinet advised she is having her annual physical done by her OBGYN. She will back to schedule a CPE if necessary. Thank you

## 2020-05-19 NOTE — Telephone Encounter (Signed)
Patient states that she was advised by Nira Conn to schedule a lab visit- looking at the AVS, I do not see a lab order. Please advise. Thank yo

## 2020-05-19 NOTE — Telephone Encounter (Signed)
Hey. She's right. At the end of the note, I asked that she have FBW prior to physical with B12, folate, Free T4, and Vitamin D - about one week before next visit. Ok to schedule.  Just make sure to tell her she should be fasting. Thanks.

## 2020-05-24 ENCOUNTER — Encounter: Payer: Self-pay | Admitting: Nurse Practitioner

## 2020-05-26 ENCOUNTER — Encounter: Payer: Self-pay | Admitting: Nurse Practitioner

## 2020-05-26 ENCOUNTER — Ambulatory Visit (INDEPENDENT_AMBULATORY_CARE_PROVIDER_SITE_OTHER): Payer: BC Managed Care – PPO | Admitting: Nurse Practitioner

## 2020-05-26 VITALS — Wt 294.0 lb

## 2020-05-26 DIAGNOSIS — F411 Generalized anxiety disorder: Secondary | ICD-10-CM

## 2020-05-26 DIAGNOSIS — J301 Allergic rhinitis due to pollen: Secondary | ICD-10-CM | POA: Diagnosis not present

## 2020-05-26 NOTE — Patient Instructions (Signed)
https://www.nimh.nih.gov/health/topics/anxiety-disorders/index.shtml">  Panic Attack A panic attack is when you suddenly feel very afraid, uncomfortable, or nervous (anxious). A panic attack can happen when you are scared or for no reason. A panic attack can feel like a serious problem. It can even feel like a heart attack or stroke. See your doctor when you have a panic attack to make sure you do not have a serious problem. Follow these instructions at home:  Take medicines only as told by your doctor.  If you feel worried or nervous, try not to have caffeine.  Take good care of your health. To do this: ? Eat healthy. Make sure to eat fresh fruits and vegetables, whole grains, lean meats, and low-fat dairy. ? Get enough sleep. Try to sleep for 7-8 hours each night. ? Exercise. Try to be active for 30 minutes 5 or more days a week. ? Do not smoke. Talk to your doctor if you need help quitting. ? Limit how much alcohol you drink:  If you are a woman who is not pregnant: try not to have more than 1 drink a day.  If you are a man: try not to have more than 2 drinks a day.  One drink equals 12 oz of beer, 5 oz of wine, or 1 oz of hard liquor.  Keep all follow-up visits as told by your doctor. This is important.   Contact a doctor if:  Your symptoms do not get better.  Your symptoms get worse.  You are not able to take your medicines as told. Get help right away if:  You have thoughts of hurting yourself or others.  You have symptoms of a panic attack. Do not drive yourself to the hospital. Have someone else drive you or call an ambulance. If you feel like you may hurt yourself or others, or have thoughts about taking your own life, get help right away. You can go to your nearest emergency department or call:  Your local emergency services (911 in the [place]  A suicide crisis helpline, such as the National Suicide Prevention Lifeline at 1-800-273-8255. This is open 24 hours a  day. Summary  A panic attack is when you suddenly feel very afraid, uncomfortable, or nervous (anxious).  See your doctor when you have a panic attack to make sure that you do not have another serious problem.  If you feel like you may hurt yourself or others, get help right away by calling 911. This information is not intended to replace advice given to you by your health care provider. Make sure you discuss any questions you have with your health care provider. Document Revised: 06/26/2019 Document Reviewed: 06/26/2019 Elsevier Patient Education  2021 Elsevier Inc.  

## 2020-05-26 NOTE — Progress Notes (Signed)
Virtual Visit via Telephone Note  I connected with Erin Mcdonald on 05/26/20 at  3:10 PM EDT by telephone and verified that I am speaking with the correct person using two identifiers.  Location: Patient: home Provider: [place] [facility] at [facility]    I discussed the limitations, risks, security and privacy concerns of performing an evaluation and management service by telephone and the availability of in person appointments. I also discussed with the patient that there may be a patient responsible charge related to this service. The patient expressed understanding and agreed to proceed.   History of Present Illness: The patient presents for evaluation of generalized anxiety and panic attack. She states that as pandemic COVID 19 improves, her employer was starting to bring saff into the office for working hours. She had been working remotely since March 2020 and more productive while working from home. She states that she prepared herself mentally to go back to work and felt ready for this. She does have long history of anxiety and depression. Her symptoms have been managed fairly well with current dose lexapro and as needed alprazolam. She states that change has been a big trigger for her anxiety. Though she felt prepared to return to the workplace, she started having panic attacks which were keeping her awake at night and prevented her from actually returning to workplace. The patient has talked with her employer and was told that this is something they have been running into quite often. Patient was told that a noted from her primary provider stating the problem and that it would be better for her to continue to work remotely for the time being, would be acceptable and the patient would be able to continue to work from home without negative repercussions. The patient states that she has been able to do this so far, but a note is necessary in the near future for her to continue.  The patient  states that she needs to have refills of both her alprazolam and lexapro today. Reviewed her PDMP profile. Her Overdose risk score is 280. She does have refill remaining for both alprazolam and lexapro. Will have her fill those and request new prescriptions when appropriate.    Observations/Objective:  The patient is alert and oriented. She is pleasant and answers all questions appropriately. Breathing is non-labored. She is in no acute distress at this time.   Assessment and Plan: 1. GAD (generalized anxiety disorder) Worsening of panic attacks and generalized anxiety with idea of returning to working on site rather than remotely. She has been working from home since beginning of COVID 19 pandemic in March 2020. Will provide her a note stating that she should continue to work remotely as long as possible due to mental health concerns. Again, encouraged referral to psychiatry if problem persistent. She voiced understanding and agreement with plan.   2. Seasonal allergic rhinitis due to pollen Continue daily allergy medication and nasal sprays as prescribed .  Follow Up Instructions:    I discussed the assessment and treatment plan with the patient. The patient was provided an opportunity to ask questions and all were answered. The patient agreed with the plan and demonstrated an understanding of the instructions.   The patient was advised to call back or seek an in-person evaluation if the symptoms worsen or if the condition fails to improve as anticipated.  I provided 25 minutes of non-face-to-face time during this encounter.   [name], NP

## 2020-05-31 ENCOUNTER — Ambulatory Visit: Payer: BC Managed Care – PPO | Admitting: Obstetrics and Gynecology

## 2020-05-31 NOTE — Progress Notes (Deleted)
PCP:  [name], NP   No chief complaint on file.    HPI:      Ms. Erin Mcdonald is a 45 y.o. G0P0000 who LMP was No LMP recorded., presents today for her annual examination.  Her menses are every 4-6 wks now, lasting 4-5 days, med to heavy flow, changing overnight pads Q2 hrs, with dime to quarter sized clots.  Dysmenorrhea mild, occurring premenstrually and with menses, improved with ibup. She does not have intermenstrual bleeding. Discussed BC for cycle control in past but pt not interested in hormones or IUD. Is tobacco user so can't have estrogen products. Would like endometrial ablation. Had neg GYN u/s 3/21 for menorrhagia. Discussed ablation, POPs, lysteda.   Sex activity: not currently active, contraception - condoms in past Last Pap: May 26, 2015  Results were: no abnormalities /neg HPV DNA  Hx of STDs: HSV, takes valtrex daily (Rx with PCP); neg STD testing 6/21  Last mammogram: 02/10/20  Results were: normal, repeat in 12 months. Hx of Cat 3 RT breast due to stable mass; followed by PCP. There is no FH of breast cancer. There is no FH of ovarian cancer. The patient does do self-breast exams.  Tobacco use: 1/2 ppd Alcohol use: social drinker No drug use.  Exercise: not active  She does get adequate calcium and Vitamin D in her diet. Labs with PCP.    Past Medical History:  Diagnosis Date  . Anxiety   . Depression   . Genital herpes   . Sleep apnea     Past Surgical History:  Procedure Laterality Date  . LAPAROSCOPIC GASTRIC SLEEVE RESECTION  11/15/2012   [facility], Dr Leda Gauze  . TONSILLECTOMY  2013    Family History  Problem Relation Age of Onset  . Heart attack Mother   . Diabetes Paternal Grandmother   . Diabetes Paternal Grandfather   . Colon cancer Father 89  . Breast cancer Neg Hx   . Ovarian cancer Neg Hx     Social History   Socioeconomic History  . Marital status: Single    Spouse name: Not on file  . Number of children: Not on file   . Years of education: Not on file  . Highest education level: Not on file  Occupational History  . Not on file  Tobacco Use  . Smoking status: Current Every Day Smoker    Packs/day: 1.00  . Smokeless tobacco: Never Used  Vaping Use  . Vaping Use: Never used  Substance and Sexual Activity  . Alcohol use: Yes    Comment: occasionally  . Drug use: No  . Sexual activity: Not Currently    Birth control/protection: None  Other Topics Concern  . Not on file  Social History Narrative  . Not on file   Social Determinants of Health   Financial Resource Strain: Not on file  Food Insecurity: Not on file  Transportation Needs: Not on file  Physical Activity: Not on file  Stress: Not on file  Social Connections: Not on file  Intimate Partner Violence: Not on file    Outpatient Medications Prior to Visit  Medication Sig Dispense Refill  . ALPRAZolam (XANAX) 1 MG tablet Take 1 tablet po TID prn anxiety 90 tablet 2  . azelastine (ASTELIN) 0.1 % nasal spray Place 2 sprays into both nostrils 2 (two) times daily. Use in each nostril as directed 30 mL 5  . cyanocobalamin (,VITAMIN B-12,) 1000 MCG/ML injection Inject 73ml IM  once weekly for 6 weeks then inject 31ml IM monthly 10 mL 1  . escitalopram (LEXAPRO) 20 MG tablet Take 1.5tablets po QD 45 tablet 2  . fluticasone (FLONASE) 50 MCG/ACT nasal spray Place 2 sprays into both nostrils daily. 16 g 6  . montelukast (SINGULAIR) 10 MG tablet Take 1 tablet (10 mg total) by mouth at bedtime. (Patient not taking: No sig reported) 30 tablet 3  . omeprazole (PRILOSEC) 20 MG capsule Take 1 capsule (20 mg total) by mouth 2 (two) times daily. 60 capsule 2  . Syringe/Needle, Disp, (SYRINGE 3CC/25GX1") 25G X 1" 3 ML MISC Use for IM b12 injections. 4 each 5  . tranexamic acid (LYSTEDA) 650 MG TABS tablet Take 2 tablets (1,300 mg total) by mouth 3 (three) times daily. Take during menses for a maximum of five days 30 tablet 2  . valACYclovir (VALTREX) 1000 MG  tablet Take 1 tablet (1,000 mg total) by mouth daily. 30 tablet 2   No facility-administered medications prior to visit.    ROS:  Review of Systems  Constitutional: Negative for fatigue, fever and unexpected weight change.  Respiratory: Negative for cough, shortness of breath and wheezing.   Cardiovascular: Negative for chest pain, palpitations and leg swelling.  Gastrointestinal: Negative for blood in stool, constipation, diarrhea, nausea and vomiting.  Endocrine: Negative for cold intolerance, heat intolerance and polyuria.  Genitourinary: Negative for dyspareunia, dysuria, flank pain, frequency, genital sores, hematuria, menstrual problem, pelvic pain, urgency, vaginal bleeding, vaginal discharge and vaginal pain.  Musculoskeletal: Negative for back pain, joint swelling and myalgias.  Skin: Negative for rash.  Neurological: Negative for dizziness, syncope, light-headedness, numbness and headaches.  Hematological: Negative for adenopathy.  Psychiatric/Behavioral: Negative for agitation, confusion, sleep disturbance and suicidal ideas. The patient is not nervous/anxious.    BREAST: No symptoms   Objective: There were no vitals taken for this visit.   Physical Exam Constitutional:      Appearance: She is well-developed.  Genitourinary:     Vulva normal.     No vaginal discharge, erythema or tenderness.      Right Adnexa: not tender and no mass present.    Left Adnexa: not tender and no mass present.    No cervical motion tenderness or polyp.     Uterus is not enlarged or tender.  Breasts:     Right: No mass, nipple discharge, skin change or tenderness.     Left: No mass, nipple discharge, skin change or tenderness.    Neck:     Thyroid: No thyromegaly.  Cardiovascular:     Rate and Rhythm: Normal rate and regular rhythm.     Heart sounds: Normal heart sounds. No murmur heard.   Pulmonary:     Effort: Pulmonary effort is normal.     Breath sounds: Normal breath  sounds.  Abdominal:     Palpations: Abdomen is soft.     Tenderness: There is no abdominal tenderness. There is no guarding.  Musculoskeletal:        General: Normal range of motion.     Cervical back: Normal range of motion.  Neurological:     General: No focal deficit present.     Mental Status: She is alert and oriented to person, place, and time.     Cranial Nerves: No cranial nerve deficit.  Skin:    General: Skin is warm and dry.  Psychiatric:        Mood and Affect: Mood normal.  Behavior: Behavior normal.        Thought Content: Thought content normal.        Judgment: Judgment normal.  Vitals reviewed.    RESULTS:  No results found for this or any previous visit (from the past 24 hour(s)).  Assessment/Plan: Encounter for annual routine gynecological examination  Encounter for screening mammogram for malignant neoplasm of breast; pt current on mammo, followed by PCP  Menorrhagia with regular cycle - Plan: POCT hemoglobin, US PELVIS TRANSVAGINAL NON-OB (TV ONLY)--Discussed prog only options, IUD, ablation, hyst, Lysteda. Will check GYN u/s first. Pt interested in ablation but going to [facility] 5/21 and doesn't want period then. If u/s ok for ablation, pt to RTO with MD for surg consult. May do Lysteda or POPs in meantime for [place] trip and ablation after she returns.            GYN counsel breast self exam, mammography screening, family planning choices, adequate intake of calcium and vitamin D, diet and exercise     F/U  No follow-ups on file.  Frederic Tones B. Carla Rashad, PA-C 05/31/2020 10:49 AM

## 2020-06-01 ENCOUNTER — Ambulatory Visit (HOSPITAL_BASED_OUTPATIENT_CLINIC_OR_DEPARTMENT_OTHER): Payer: BC Managed Care – PPO | Attending: Physician Assistant | Admitting: Internal Medicine

## 2020-06-01 DIAGNOSIS — G4733 Obstructive sleep apnea (adult) (pediatric): Secondary | ICD-10-CM | POA: Insufficient documentation

## 2020-06-01 DIAGNOSIS — R0683 Snoring: Secondary | ICD-10-CM | POA: Diagnosis not present

## 2020-06-02 ENCOUNTER — Other Ambulatory Visit: Payer: Self-pay

## 2020-06-04 ENCOUNTER — Other Ambulatory Visit: Payer: Self-pay | Admitting: Nurse Practitioner

## 2020-06-04 DIAGNOSIS — Z0000 Encounter for general adult medical examination without abnormal findings: Secondary | ICD-10-CM

## 2020-06-04 DIAGNOSIS — D509 Iron deficiency anemia, unspecified: Secondary | ICD-10-CM

## 2020-06-04 DIAGNOSIS — E559 Vitamin D deficiency, unspecified: Secondary | ICD-10-CM

## 2020-06-04 DIAGNOSIS — E538 Deficiency of other specified B group vitamins: Secondary | ICD-10-CM

## 2020-06-04 DIAGNOSIS — R7301 Impaired fasting glucose: Secondary | ICD-10-CM

## 2020-06-04 DIAGNOSIS — R5383 Other fatigue: Secondary | ICD-10-CM

## 2020-06-04 NOTE — Progress Notes (Signed)
Routine, fasting labs along with anemia panel, and HgbA1c placed in Epic. Patient to have labs drawn in [facility] lab.

## 2020-06-08 ENCOUNTER — Encounter: Payer: Self-pay | Admitting: Nurse Practitioner

## 2020-06-11 ENCOUNTER — Ambulatory Visit: Payer: BC Managed Care – PPO | Admitting: Obstetrics and Gynecology

## 2020-06-15 ENCOUNTER — Encounter: Payer: Self-pay | Admitting: Nurse Practitioner

## 2020-06-15 DIAGNOSIS — G4730 Sleep apnea, unspecified: Secondary | ICD-10-CM

## 2020-06-16 NOTE — Telephone Encounter (Signed)
I didn't see this come up in my results to review. But she is positive for sleep apnea. Do I just need to put in a referral to sleep disorders clinic and notify her? Just let me know. Thanks.

## 2020-06-17 ENCOUNTER — Institutional Professional Consult (permissible substitution): Payer: BC Managed Care – PPO | Admitting: Plastic Surgery

## 2020-06-17 ENCOUNTER — Other Ambulatory Visit: Payer: Self-pay | Admitting: Nurse Practitioner

## 2020-06-17 ENCOUNTER — Encounter: Payer: Self-pay | Admitting: Nurse Practitioner

## 2020-06-17 DIAGNOSIS — J301 Allergic rhinitis due to pollen: Secondary | ICD-10-CM

## 2020-06-17 DIAGNOSIS — E538 Deficiency of other specified B group vitamins: Secondary | ICD-10-CM

## 2020-06-17 DIAGNOSIS — F321 Major depressive disorder, single episode, moderate: Secondary | ICD-10-CM

## 2020-06-17 DIAGNOSIS — G4730 Sleep apnea, unspecified: Secondary | ICD-10-CM

## 2020-06-17 DIAGNOSIS — F411 Generalized anxiety disorder: Secondary | ICD-10-CM

## 2020-06-17 DIAGNOSIS — B009 Herpesviral infection, unspecified: Secondary | ICD-10-CM

## 2020-06-17 DIAGNOSIS — K219 Gastro-esophageal reflux disease without esophagitis: Secondary | ICD-10-CM

## 2020-06-17 MED ORDER — ALPRAZOLAM 1 MG PO TABS: ORAL_TABLET | ORAL | 2 refills | Status: DC

## 2020-06-17 MED ORDER — VALACYCLOVIR HCL 1 G PO TABS: 1000.0000 mg | ORAL_TABLET | Freq: Every day | ORAL | 5 refills | Status: DC

## 2020-06-17 MED ORDER — ESCITALOPRAM OXALATE 20 MG PO TABS: ORAL_TABLET | ORAL | 1 refills | Status: DC

## 2020-06-17 MED ORDER — OMEPRAZOLE 20 MG PO CPDR: 20.0000 mg | DELAYED_RELEASE_CAPSULE | Freq: Two times a day (BID) | ORAL | 1 refills | Status: DC

## 2020-06-17 MED ORDER — SYRINGE 25G X 1" 3 ML MISC
5 refills | Status: DC
Start: 2020-06-17 — End: 2021-04-22

## 2020-06-17 MED ORDER — MONTELUKAST SODIUM 10 MG PO TABS: 10.0000 mg | ORAL_TABLET | Freq: Every day | ORAL | 1 refills | Status: DC

## 2020-06-17 MED ORDER — CYANOCOBALAMIN 1000 MCG/ML IJ SOLN: INTRAMUSCULAR | 1 refills | Status: DC

## 2020-06-17 NOTE — Progress Notes (Signed)
Refilled prescriptions and sent them to pharmacy.

## 2020-06-18 NOTE — Telephone Encounter (Signed)
When I try to do referral to sleep disorders clinic, it just comes up as sleep studies. Is that what I need to do and then just specify that she needs to be evaluation for sleep apnea? Im sorry.

## 2020-06-18 NOTE — Telephone Encounter (Signed)
She already had the sleep study which is positive for sleep apnea

## 2020-06-19 DIAGNOSIS — R0683 Snoring: Secondary | ICD-10-CM | POA: Diagnosis not present

## 2020-06-19 NOTE — Procedures (Signed)
        Patient Name: Erin Mcdonald, Erin Mcdonald Date: 06/01/2020 Gender: Female D.O.B: 02-06-1975 Age (years): 45 Referring Provider: Lorrene Reid PA-C Height (inches): 87 Interpreting Physician: Baird Lyons MD, ABSM Weight (lbs): 285 RPSGT: Jacolyn Reedy BMI: 42 MRN: [id number] Neck Size:   CLINICAL INFORMATION Sleep Study Type: HST    Indication for sleep study: OSA    Epworth Sleepiness Score: 5  SLEEP STUDY TECHNIQUE A multi-channel overnight portable sleep study was performed. The channels recorded were: nasal airflow, thoracic respiratory movement, and oxygen saturation with a pulse oximetry. Snoring was also monitored.  MEDICATIONS Patient self administered medications include: none reported.  SLEEP ARCHITECTURE Patient was studied for 376.1 minutes. The sleep efficiency was 98.9 % and the patient was supine for 99.6%. The arousal index was 0.0 per hour.  RESPIRATORY PARAMETERS The overall AHI was 59.2 per hour, with a central apnea index of 0 per hour.  The oxygen nadir was 82% during sleep.    CARDIAC DATA Mean heart rate during sleep was 83.8 bpm.  IMPRESSIONS - Severe obstructive sleep apnea occurred during this study (AHI = 59.2/h). - Moderate oxygen desaturation was noted during this study (Min O2 = 82%). Mean O2 saturation 96%. - Patient snored.  DIAGNOSIS - Obstructive Sleep Apnea (G47.33)  RECOMMENDATIONS - Suggest CPAP titration sleep study. Other options including autopap, management consultation or ENT evaluation would be based on clinical judgment. - Be careful with alcohol, sedatives and other CNS depressants that may worsen sleep apnea and disrupt normal sleep architecture. - Sleep hygiene should be reviewed to assess factors that may improve sleep quality. - Weight management and regular exercise should be initiated or continued.  [Electronically signed] 06/19/2020 10:22 AM  Baird Lyons MD, ABSM Diplomate, American Board of  Sleep Medicine   NPI: [id number]                     [facility]  ELECTRONICALLY SIGNED ON:  06/19/2020, 10:19 AM [place] PH: [phone]   FX: [phone] [place]

## 2020-06-24 NOTE — Progress Notes (Signed)
Patient has been referred to sleep disorders clinic for continued evaluation and treatment

## 2020-06-25 ENCOUNTER — Other Ambulatory Visit: Payer: Self-pay | Admitting: Nurse Practitioner

## 2020-06-25 DIAGNOSIS — R7301 Impaired fasting glucose: Secondary | ICD-10-CM

## 2020-06-25 DIAGNOSIS — R5383 Other fatigue: Secondary | ICD-10-CM

## 2020-06-25 DIAGNOSIS — Z0000 Encounter for general adult medical examination without abnormal findings: Secondary | ICD-10-CM

## 2020-06-25 DIAGNOSIS — E559 Vitamin D deficiency, unspecified: Secondary | ICD-10-CM

## 2020-06-25 DIAGNOSIS — D509 Iron deficiency anemia, unspecified: Secondary | ICD-10-CM

## 2020-06-25 DIAGNOSIS — E538 Deficiency of other specified B group vitamins: Secondary | ICD-10-CM

## 2020-06-25 NOTE — Progress Notes (Signed)
Order for routine fasting labs and ferritin and b12 levels placed in Epic. Labs to be drawn at hospital outpatient labs.

## 2020-06-30 NOTE — Telephone Encounter (Signed)
Erin Mcdonald. I hve referred her to sleep disorders center. Can you check on this for me? I am not sure that it is for [facility], but if we could do it there , that would be great. Thanks.

## 2020-06-30 NOTE — Telephone Encounter (Signed)
Called sleep studies center in [facility]. Unable to talk to referral coordinator but left a voicemail for them to callback.

## 2020-07-01 ENCOUNTER — Other Ambulatory Visit: Payer: Self-pay

## 2020-08-16 ENCOUNTER — Telehealth: Payer: Self-pay | Admitting: Nurse Practitioner

## 2020-08-16 ENCOUNTER — Other Ambulatory Visit: Payer: Self-pay

## 2020-08-16 DIAGNOSIS — F321 Major depressive disorder, single episode, moderate: Secondary | ICD-10-CM

## 2020-08-16 MED ORDER — ESCITALOPRAM OXALATE 20 MG PO TABS: ORAL_TABLET | ORAL | 1 refills | Status: DC

## 2020-08-16 NOTE — Telephone Encounter (Signed)
Patient would like a refill on Lexapro and uses [facility] in [place]. Thanks

## 2020-08-17 ENCOUNTER — Other Ambulatory Visit: Payer: Self-pay | Admitting: Nurse Practitioner

## 2020-08-17 ENCOUNTER — Encounter: Payer: Self-pay | Admitting: Nurse Practitioner

## 2020-08-17 ENCOUNTER — Telehealth: Payer: Self-pay | Admitting: Nurse Practitioner

## 2020-08-17 DIAGNOSIS — F321 Major depressive disorder, single episode, moderate: Secondary | ICD-10-CM

## 2020-08-17 MED ORDER — ESCITALOPRAM OXALATE 20 MG PO TABS: ORAL_TABLET | ORAL | 1 refills | Status: DC

## 2020-08-17 NOTE — Telephone Encounter (Signed)
Patient needs a pre-authorization for Lexapro and the pharmacy is [facility] in [place]. Please advise, thanks.

## 2020-08-17 NOTE — Progress Notes (Signed)
Changed lexapro to 1 tablet po QD and sent to [facility].

## 2020-08-23 ENCOUNTER — Other Ambulatory Visit: Payer: Self-pay | Admitting: Obstetrics and Gynecology

## 2020-08-23 DIAGNOSIS — N920 Excessive and frequent menstruation with regular cycle: Secondary | ICD-10-CM

## 2020-08-23 MED ORDER — TRANEXAMIC ACID 650 MG PO TABS: 1300.0000 mg | ORAL_TABLET | Freq: Three times a day (TID) | ORAL | 0 refills | Status: DC

## 2020-08-23 NOTE — Telephone Encounter (Signed)
Called pt, no answer, left detailed msg Rx sent to pharmacy. 

## 2020-08-23 NOTE — Telephone Encounter (Signed)
Pt calling; has scheduled annual for 8/23rd; needs refill of lysteda to get her to appt.  [phone]

## 2020-08-23 NOTE — Telephone Encounter (Signed)
Rx RF eRxd. Pls notify pt. Thx

## 2020-08-23 NOTE — Progress Notes (Signed)
Rx RF lysteda till 8/22 annual

## 2020-08-24 ENCOUNTER — Telehealth: Payer: Self-pay

## 2020-08-24 NOTE — Telephone Encounter (Signed)
Pt recently had sleep study, sleep study results printed. Nothing further needed.

## 2020-08-25 ENCOUNTER — Other Ambulatory Visit: Payer: Self-pay

## 2020-08-25 ENCOUNTER — Ambulatory Visit (INDEPENDENT_AMBULATORY_CARE_PROVIDER_SITE_OTHER): Payer: BC Managed Care – PPO | Admitting: Primary Care

## 2020-08-25 ENCOUNTER — Encounter: Payer: Self-pay | Admitting: Primary Care

## 2020-08-25 VITALS — BP 132/80 | HR 74 | Temp 97.7°F | Ht 69.5 in | Wt 301.0 lb

## 2020-08-25 DIAGNOSIS — G4733 Obstructive sleep apnea (adult) (pediatric): Secondary | ICD-10-CM

## 2020-08-25 NOTE — Progress Notes (Signed)
$'@Patient'H$  ID: Erin Mcdonald, female    DOB: 1975-10-26, 45 y.o.   MRN: [id number]  No chief complaint on file.   Referring provider: [name], NP  HPI: 45 year old female, current everyday smoker.  Past medical history significant for obstructive sleep apnea, seasonal allergic rhinitis, chronic venous insufficiency, GERD, impaired fasting glucose, anxiety/depression, obesity.  08/25/2020 Patient presents today for sleep consult. She reports loud snoring, restless sleep and daytime fatigue.  She had a sleep study on 06/01/2020 that showed severe obstructive sleep apnea, AHI 59.2/hr with nadir oxygen 82%. She has used CPAP in the past. We reviewed how untreated sleep apnea can put patient at increased risk for cardiac arrhthymias, pulm HTN, stroke, DM.  We then reviewed sleep study results and treatment options, she has elected to proceed with auto PAP. Epworth 9.    Allergies  Allergen Reactions   Codeine Other (See Comments)    Cause pt to be hyper    Immunization History  Administered Date(s) Administered   Influenza Inj Mdck Quad Pf 10/23/2018   Influenza,inj,Quad PF,6+ Mos 10/14/2014, 10/13/2016, 12/25/2017   Moderna Sars-Covid-2 Vaccination 01/14/2020   PFIZER(Purple Top)SARS-COV-2 Vaccination 04/15/2019, 04/29/2019   Tdap 06/12/2019    Past Medical History:  Diagnosis Date   Anxiety    Depression    Genital herpes    Sleep apnea     Tobacco History: Social History   Tobacco Use  Smoking Status Every Day   Packs/day: 1.00   Types: Cigarettes  Smokeless Tobacco Never  Tobacco Comments   0.5-1ppd 08/25/2020   Ready to quit: Not Answered Counseling given: Not Answered Tobacco comments: 0.5-1ppd 08/25/2020   Outpatient Medications Prior to Visit  Medication Sig Dispense Refill   ALPRAZolam (XANAX) 1 MG tablet Take 1 tablet po TID prn anxiety 90 tablet 2   azelastine (ASTELIN) 0.1 % nasal spray Place 2 sprays into both nostrils 2 (two) times daily. Use in each  nostril as directed 30 mL 5   cyanocobalamin (,VITAMIN B-12,) 1000 MCG/ML injection Inject 70m IM once weekly for 6 weeks then inject 141mIM monthly 10 mL 1   escitalopram (LEXAPRO) 20 MG tablet Take 1 tablet po QD 90 tablet 1   fluticasone (FLONASE) 50 MCG/ACT nasal spray Place 2 sprays into both nostrils daily. 16 g 6   montelukast (SINGULAIR) 10 MG tablet Take 1 tablet (10 mg total) by mouth at bedtime. 90 tablet 1   omeprazole (PRILOSEC) 20 MG capsule Take 1 capsule (20 mg total) by mouth 2 (two) times daily. 180 capsule 1   Syringe/Needle, Disp, (SYRINGE 3CC/25GX1") 25G X 1" 3 ML MISC Use for IM b12 injections. 4 each 5   tranexamic acid (LYSTEDA) 650 MG TABS tablet Take 2 tablets (1,300 mg total) by mouth 3 (three) times daily. Take during menses for a maximum of five days 30 tablet 0   valACYclovir (VALTREX) 1000 MG tablet Take 1 tablet (1,000 mg total) by mouth daily. 30 tablet 5   No facility-administered medications prior to visit.    Review of Systems  Review of Systems  Constitutional:  Positive for fatigue.  Respiratory: Negative.    Cardiovascular: Negative.   Psychiatric/Behavioral:  Positive for sleep disturbance.     Physical Exam  BP 132/80 (BP Location: Left Arm, Patient Position: Sitting, Cuff Size: Normal)   Pulse 74   Temp 97.7 F (36.5 C) (Oral)   Ht 5' 9.5" (1.765 m)   Wt (!) 301 lb (136.5 kg)   SpO2 99%  BMI 43.81 kg/m  Physical Exam Constitutional:      Appearance: Normal appearance.  HENT:     Head: Normocephalic and atraumatic.  Cardiovascular:     Rate and Rhythm: Normal rate and regular rhythm.  Pulmonary:     Effort: Pulmonary effort is normal.     Breath sounds: Normal breath sounds.  Musculoskeletal:        General: Normal range of motion.  Neurological:     General: No focal deficit present.     Mental Status: She is alert and oriented to person, place, and time. Mental status is at baseline.  Psychiatric:        Mood and Affect:  Mood normal.        Behavior: Behavior normal.        Thought Content: Thought content normal.        Judgment: Judgment normal.     Lab Results:  CBC    Component Value Date/Time   WBC 11.8 (H) 09/24/2017 1726   RBC 4.49 09/24/2017 1726   HGB 13.5 03/13/2019 1537   HGB 13.3 09/24/2017 1726   HCT 40.4 09/24/2017 1726   PLT 351 09/24/2017 1726   MCV 90.1 09/24/2017 1726   MCH 29.7 09/24/2017 1726   MCHC 32.9 09/24/2017 1726   RDW 14.9 (H) 09/24/2017 1726   LYMPHSABS 1.8 04/28/2008 1100   MONOABS 0.4 04/28/2008 1100   EOSABS 0.4 04/28/2008 1100   BASOSABS 0.1 04/28/2008 1100    BMET    Component Value Date/Time   NA 135 09/24/2017 1726   K 4.1 09/24/2017 1726   CL 105 09/24/2017 1726   CO2 23 09/24/2017 1726   GLUCOSE 105 (H) 09/24/2017 1726   BUN 13 09/24/2017 1726   CREATININE 0.68 09/24/2017 1726   CALCIUM 9.2 09/24/2017 1726   GFRNONAA >60 09/24/2017 1726   GFRAA >60 09/24/2017 1726    BNP No results found for: BNP  ProBNP No results found for: PROBNP  Imaging: No results found.   Assessment & Plan:   OSA (obstructive sleep apnea) - Hx OSA, previously on CPAP several years ago. No longer has machine. Patient has symptoms of loud snoring, restless sleep and daytime fatigue. Epworth 9. Sleep study on 06/01/2020 showed severe obstructive sleep apnea, AHI 59.2/hr with nadir oxygen 82%.  We reviewed risks of untreated sleep apnea and treatment options. She elected to start auto CPAP vs CPAP titration study.  - Sending in DME for new CPAP, auto titrate 5-20cm h20 - Advised patient not to drive if experiencing excessive daytime fatigue or somnolence. Continues to encourage weight loss efforts - FU in 31-90 days after receiving CPAP / she will need ONO once established on treatment     Martyn Ehrich, NP 08/26/2020

## 2020-08-25 NOTE — Patient Instructions (Signed)
Home sleep study showed severe obstructive sleep apnea, recommend you start on Auto CPAP   Recommendations: Once you get CPAP machine please aim to wear every night minimum 4-6 hours or longer Do not drive if experiencing excessive daytime fatigue Continue to work on weight loss efforts   Orders: Auto CPAP 5-20cm h20, mask of choice, heated humidity, enroll in airview   Follow-up: 31-90 days after starting CPAP     Sleep Apnea Sleep apnea affects breathing during sleep. It causes breathing to stop for 10 seconds or more, or to become shallow. People with sleep apnea usually snoreloudly. It can also increase the risk of: Heart attack. Stroke. Being very overweight (obese). Diabetes. Heart failure. Irregular heartbeat. High blood pressure. The goal of treatment is to help you breathe normally again. What are the causes?  The most common cause of this condition is a collapsed or blocked airway. There are three kinds of sleep apnea: Obstructive sleep apnea. This is caused by a blocked or collapsed airway. Central sleep apnea. This happens when the brain does not send the right signals to the muscles that control breathing. Mixed sleep apnea. This is a combination of obstructive and central sleep apnea. What increases the risk? Being overweight. Smoking. Having a small airway. Being older. Being female. Drinking alcohol. Taking medicines to calm yourself (sedatives or tranquilizers). Having family members with the condition. Having a tongue or tonsils that are larger than normal. What are the signs or symptoms? Trouble staying asleep. Loud snoring. Headaches in the morning. Waking up gasping. Dry mouth or sore throat in the morning. Being sleepy or tired during the day. If you are sleepy or tired during the day, you may also: Not be able to focus your mind (concentrate). Forget things. Get angry a lot and have mood swings. Feel sad (depressed). Have changes in your  personality. Have less interest in sex, if you are female. Be unable to have an erection, if you are female. How is this treated?  Sleeping on your side. Using a medicine to get rid of mucus in your nose (decongestant). Avoiding the use of alcohol, medicines to help you relax, or certain pain medicines (narcotics). Losing weight, if needed. Changing your diet. Quitting smoking. Using a machine to open your airway while you sleep, such as: An oral appliance. This is a mouthpiece that shifts your lower jaw forward. A CPAP device. This device blows air through a mask when you breathe out (exhale). An EPAP device. This has valves that you put in each nostril. A BPAP device. This device blows air through a mask when you breathe in (inhale) and breathe out. Having surgery if other treatments do not work. Follow these instructions at home: Lifestyle Make changes that your doctor recommends. Eat a healthy diet. Lose weight if needed. Avoid alcohol, medicines to help you relax, and some pain medicines. Do not smoke or use any products that contain nicotine or tobacco. If you need help quitting, ask your doctor. General instructions Take over-the-counter and prescription medicines only as told by your doctor. If you were given a machine to use while you sleep, use it only as told by your doctor. If you are having surgery, make sure to tell your doctor you have sleep apnea. You may need to bring your device with you. Keep all follow-up visits. Contact a doctor if: The machine that you were given to use during sleep bothers you or does not seem to be working. You do not get better.  You get worse. Get help right away if: Your chest hurts. You have trouble breathing in enough air. You have an uncomfortable feeling in your back, arms, or stomach. You have trouble talking. One side of your body feels weak. A part of your face is hanging down. These symptoms may be an emergency. Get help right  away. Call your local emergency services (911 in the [place] Do not wait to see if the symptoms will go away. Do not drive yourself to the hospital. Summary This condition affects breathing during sleep. The most common cause is a collapsed or blocked airway. The goal of treatment is to help you breathe normally while you sleep. This information is not intended to replace advice given to you by your health care provider. Make sure you discuss any questions you have with your healthcare provider. Document Revised: 12/05/2019 Document Reviewed: 12/05/2019 Elsevier Patient Education  2022 [employer].   CPAP and BPAP Information CPAP and BPAP (also called BiPAP) are methods that use air pressure to keep your airways open and to help you breathe well. CPAP and BPAP use different amounts of pressure. Your health care provider will tell you whether CPAP or BPAP would be more helpful for you. CPAP stands for "continuous positive airway pressure." With CPAP, the amount of pressure stays the same while you breathe in (inhale) and out (exhale). BPAP stands for "bi-level positive airway pressure." With BPAP, the amount of pressure will be higher when you inhale and lower when you exhale. This allows you to take larger breaths. CPAP or BPAP may be used in the hospital, or your health care provider may want you to use it at home. You may need to have a sleep study before your healthcare provider can order a machine for you to use at home. What are the advantages? CPAP or BPAP can be helpful if you have: Sleep apnea. Chronic obstructive pulmonary disease (COPD). Heart failure. Medical conditions that cause muscle weakness, including muscular dystrophy or amyotrophic lateral sclerosis (ALS). Other problems that cause breathing to be shallow, weak, abnormal, or difficult. CPAP and BPAP are most commonly used for obstructive sleep apnea (OSA) to keepthe airways from collapsing when the muscles relax during  sleep. What are the risks? Generally, this is a safe treatment. However, problems may occur, including: Irritated skin or skin sores if the mask does not fit properly. Dry or stuffy nose or nosebleeds. Dry mouth. Feeling gassy or bloated. Sinus or lung infection if the equipment is not cleaned properly. When should CPAP or BPAP be used? In most cases, the mask only needs to be worn during sleep. Generally, the mask needs to be worn throughout the night and during any daytime naps. People with certain medical conditions may also need to wear the mask at other times, such as when they are awake. Follow instructions from your health care providerabout when to use the machine. What happens during CPAP or BPAP?  Both CPAP and BPAP are provided by a small machine with a flexible plastic tube that attaches to a plastic mask that you wear. Air is blown through the mask into your nose or mouth. The amount of pressure that is used to blow the air can be adjusted on the machine. Your health care provider will set the pressuresetting and help you find the best mask for you. Tips for using the mask Because the mask needs to be snug, some people feel trapped or closed-in (claustrophobic) when first using the mask. If  you feel this way, you may need to get used to the mask. One way to do this is to hold the mask loosely over your nose or mouth and then gradually apply the mask more snugly. You can also gradually increase the amount of time that you use the mask. Masks are available in various types and sizes. If your mask does not fit well, talk with your health care provider about getting a different one. Some common types of masks include: Full face masks, which fit over the mouth and nose. Nasal masks, which fit over the nose. Nasal pillow or prong masks, which fit into the nostrils. If you are using a mask that fits over your nose and you tend to breathe through your mouth, a chin strap may be applied to  help keep your mouth closed. Use a skin barrier to protect your skin as told by your health care provider. Some CPAP and BPAP machines have alarms that may sound if the mask comes off or develops a leak. If you have trouble with the mask, it is very important that you talk with your health care provider about finding a way to make the mask easier to tolerate. Do not stop using the mask. There could be a negative impact on your health if you stop using the mask. Tips for using the machine Place your CPAP or BPAP machine on a secure table or stand near an electrical outlet. Know where the on/off switch is on the machine. Follow instructions from your health care provider about how to set the pressure on your machine and when you should use it. Do not eat or drink while the CPAP or BPAP machine is on. Food or fluids could get pushed into your lungs by the pressure of the CPAP or BPAP. For home use, CPAP and BPAP machines can be rented or purchased through home health care companies. Many different brands of machines are available. Renting a machine before purchasing may help you find out which particular machine works well for you. Your health insurance company may also decide which machine you may get. Keep the CPAP or BPAP machine and attachments clean. Ask your health care provider for specific instructions. Check the humidifier if you have a dry stuffy nose or nosebleeds. Make sure it is working correctly. Follow these instructions at home: Take over-the-counter and prescription medicines only as told by your health care provider. Ask if you can take sinus medicine if your sinuses are blocked. Do not use any products that contain nicotine or tobacco. These products include cigarettes, chewing tobacco, and vaping devices, such as e-cigarettes. If you need help quitting, ask your health care provider. Keep all follow-up visits. This is important. Contact a health care provider if: You have redness or  pressure sores on your head, face, mouth, or nose from the mask or head gear. You have trouble using the CPAP or BPAP machine. You cannot tolerate wearing the CPAP or BPAP mask. Someone tells you that you snore even when wearing your CPAP or BPAP. Get help right away if: You have trouble breathing. You feel confused. Summary CPAP and BPAP are methods that use air pressure to keep your airways open and to help you breathe well. If you have trouble with the mask, it is very important that you talk with your health care provider about finding a way to make the mask easier to tolerate. Do not stop using the mask. There could be a negative impact to your  health if you stop using the mask. Follow instructions from your health care provider about when to use the machine. This information is not intended to replace advice given to you by your health care provider. Make sure you discuss any questions you have with your healthcare provider. Document Revised: 12/05/2019 Document Reviewed: 12/05/2019 Elsevier Patient Education  2022 [employer].

## 2020-08-26 NOTE — Assessment & Plan Note (Signed)
-   Hx OSA, previously on CPAP several years ago. No longer has machine. Patient has symptoms of loud snoring, restless sleep and daytime fatigue. Epworth 9. Sleep study on 06/01/2020 showed severe obstructive sleep apnea, AHI 59.2/hr with nadir oxygen 82%.  We reviewed risks of untreated sleep apnea and treatment options. She elected to start auto CPAP vs CPAP titration study.  - Sending in DME for new CPAP, auto titrate 5-20cm h20 - Advised patient not to drive if experiencing excessive daytime fatigue or somnolence. Continues to encourage weight loss efforts - FU in 31-90 days after receiving CPAP / she will need ONO once established on treatment

## 2020-08-26 NOTE — Progress Notes (Signed)
Reviewed and agree with assessment/plan.   Chesley Mires, MD [facility] Pulmonary/Critical Care 08/26/2020, 12:43 PM Pager:  [phone]

## 2020-08-31 ENCOUNTER — Encounter: Payer: Self-pay | Admitting: Obstetrics and Gynecology

## 2020-08-31 ENCOUNTER — Other Ambulatory Visit: Payer: Self-pay

## 2020-08-31 ENCOUNTER — Ambulatory Visit (INDEPENDENT_AMBULATORY_CARE_PROVIDER_SITE_OTHER): Payer: BC Managed Care – PPO | Admitting: Obstetrics and Gynecology

## 2020-08-31 ENCOUNTER — Other Ambulatory Visit (HOSPITAL_COMMUNITY)
Admission: RE | Admit: 2020-08-31 | Discharge: 2020-08-31 | Disposition: A | Discharge status: Home / Self Care | Payer: BC Managed Care – PPO | Source: Ambulatory Visit | Attending: Obstetrics and Gynecology | Admitting: Obstetrics and Gynecology

## 2020-08-31 VITALS — BP 110/60 | Ht 69.5 in | Wt 302.0 lb

## 2020-08-31 DIAGNOSIS — Z124 Encounter for screening for malignant neoplasm of cervix: Secondary | ICD-10-CM

## 2020-08-31 DIAGNOSIS — Z01419 Encounter for gynecological examination (general) (routine) without abnormal findings: Secondary | ICD-10-CM

## 2020-08-31 DIAGNOSIS — F419 Anxiety disorder, unspecified: Secondary | ICD-10-CM

## 2020-08-31 DIAGNOSIS — Z113 Encounter for screening for infections with a predominantly sexual mode of transmission: Secondary | ICD-10-CM | POA: Insufficient documentation

## 2020-08-31 DIAGNOSIS — N920 Excessive and frequent menstruation with regular cycle: Secondary | ICD-10-CM | POA: Diagnosis not present

## 2020-08-31 DIAGNOSIS — R7303 Prediabetes: Secondary | ICD-10-CM | POA: Diagnosis not present

## 2020-08-31 DIAGNOSIS — F3281 Premenstrual dysphoric disorder: Secondary | ICD-10-CM

## 2020-08-31 DIAGNOSIS — Z1151 Encounter for screening for human papillomavirus (HPV): Secondary | ICD-10-CM | POA: Diagnosis not present

## 2020-08-31 DIAGNOSIS — Z8349 Family history of other endocrine, nutritional and metabolic diseases: Secondary | ICD-10-CM | POA: Diagnosis not present

## 2020-08-31 DIAGNOSIS — F32A Depression, unspecified: Secondary | ICD-10-CM

## 2020-08-31 DIAGNOSIS — Z1231 Encounter for screening mammogram for malignant neoplasm of breast: Secondary | ICD-10-CM

## 2020-08-31 MED ORDER — TRANEXAMIC ACID 650 MG PO TABS: 1300.0000 mg | ORAL_TABLET | Freq: Three times a day (TID) | ORAL | 12 refills | Status: DC

## 2020-08-31 NOTE — Patient Instructions (Signed)
I value your feedback and you entrusting us with your care. If you get a [facility] patient survey, I would appreciate you taking the time to let us know about your experience today. Thank you! ? ? ?

## 2020-08-31 NOTE — Progress Notes (Addendum)
PCP:  [name], NP   Chief Complaint  Patient presents with   Gynecologic Exam    No concerns     HPI:      Erin Mcdonald is a 45 y.o. G0P0000 who LMP was Patient's last menstrual period was 08/25/2020 (exact date)., presents today for her annual examination.  Her menses are every 6 wks, lasting 7 days, med to heavy flow, changing overnight pads BID to QID on lysteda (was Q2 hrs last yr with dime to quarter sized clots).  Dysmenorrhea mild, occurring premenstrually and with menses, improved with ibup. She does not have intermenstrual bleeding. Neg GYN u/s 3/21. Discussed BC for cycle control in past but pt not interested in hormones or IUD. Wanted endometrial ablation last yr but had upcoming trip, so tried [place] with sx improvement. Still considering ablation vs hyst. Has PMDD sx with increased anxiety and wonders if hyst would help with this. Takes lexapro 20 mg daily already; increased to 30 mg on her own but Rx needed to be rewritten so pt stopped it.   Sex activity: currently active, contraception - none, declines BC Last Pap: May 26, 2015  Results were: no abnormalities /neg HPV DNA  Hx of STDs: HSV, takes valtrex daily (Rx with PCP). Would like full STD testing today.   Last mammogram: 02/10/20  Results were: Cat 2 stable RT breast mass; repeat in 1 yr; followed by PCP. There is no FH of breast cancer. There is no FH of ovarian cancer. The patient does self-breast exams.  Tobacco use: occas Alcohol use: social drinker No drug use.  Exercise: not active  She does get adequate calcium and Vitamin D in her diet.  Labs with PCP but due for CBC and pt would like done here today. Would also like thyroid checked since having increased anxiety/doesn't feel well and has FH hypothyroidism in her mom. Hx of pre-DM in past. Not fasting today so can't do lipids.    Past Medical History:  Diagnosis Date   Anxiety    Depression    Genital herpes    Sleep apnea     Past  Surgical History:  Procedure Laterality Date   LAPAROSCOPIC GASTRIC SLEEVE RESECTION  11/15/2012   [facility], Dr Leonie Green Overby   TONSILLECTOMY  2013    Family History  Problem Relation Age of Onset   Heart attack Mother    Diabetes Paternal Grandmother    Diabetes Paternal Grandfather    Colon cancer Father 54   Breast cancer Neg Hx    Ovarian cancer Neg Hx     Social History   Socioeconomic History   Marital status: Single    Spouse name: Not on file   Number of children: Not on file   Years of education: Not on file   Highest education level: Not on file  Occupational History   Not on file  Tobacco Use   Smoking status: Every Day    Packs/day: 1.00    Types: Cigarettes   Smokeless tobacco: Never   Tobacco comments:    0.5-1ppd 08/25/2020  Vaping Use   Vaping Use: Never used  Substance and Sexual Activity   Alcohol use: Yes    Comment: occasionally   Drug use: No   Sexual activity: Not Currently    Birth control/protection: None  Other Topics Concern   Not on file  Social History Narrative   Not on file   Social Determinants of Health   Financial  Resource Strain: Not on file  Food Insecurity: Not on file  Transportation Needs: Not on file  Physical Activity: Not on file  Stress: Not on file  Social Connections: Not on file  Intimate Partner Violence: Not on file    Outpatient Medications Prior to Visit  Medication Sig Dispense Refill   ALPRAZolam (XANAX) 1 MG tablet Take 1 tablet po TID prn anxiety 90 tablet 2   azelastine (ASTELIN) 0.1 % nasal spray Place 2 sprays into both nostrils 2 (two) times daily. Use in each nostril as directed 30 mL 5   cyanocobalamin (,VITAMIN B-12,) 1000 MCG/ML injection Inject 20m IM once weekly for 6 weeks then inject 154mIM monthly 10 mL 1   escitalopram (LEXAPRO) 20 MG tablet Take 1 tablet po QD 90 tablet 1   fluticasone (FLONASE) 50 MCG/ACT nasal spray Place 2 sprays into both nostrils daily. 16 g 6   montelukast (SINGULAIR)  10 MG tablet Take 1 tablet (10 mg total) by mouth at bedtime. 90 tablet 1   omeprazole (PRILOSEC) 20 MG capsule Take 1 capsule (20 mg total) by mouth 2 (two) times daily. 180 capsule 1   Syringe/Needle, Disp, (SYRINGE 3CC/25GX1") 25G X 1" 3 ML MISC Use for IM b12 injections. 4 each 5   valACYclovir (VALTREX) 1000 MG tablet Take 1 tablet (1,000 mg total) by mouth daily. 30 tablet 5   tranexamic acid (LYSTEDA) 650 MG TABS tablet Take 2 tablets (1,300 mg total) by mouth 3 (three) times daily. Take during menses for a maximum of five days 30 tablet 0   No facility-administered medications prior to visit.    ROS:  Review of Systems  Constitutional:  Negative for fatigue, fever and unexpected weight change.  Respiratory:  Negative for cough, shortness of breath and wheezing.   Cardiovascular:  Negative for chest pain, palpitations and leg swelling.  Gastrointestinal:  Negative for blood in stool, constipation, diarrhea, nausea and vomiting.  Endocrine: Negative for cold intolerance, heat intolerance and polyuria.  Genitourinary:  Negative for dyspareunia, dysuria, flank pain, frequency, genital sores, hematuria, menstrual problem, pelvic pain, urgency, vaginal bleeding, vaginal discharge and vaginal pain.  Musculoskeletal:  Negative for back pain, joint swelling and myalgias.  Skin:  Negative for rash.  Neurological:  Negative for dizziness, syncope, light-headedness, numbness and headaches.  Hematological:  Negative for adenopathy.  Psychiatric/Behavioral:  Positive for agitation. Negative for confusion, sleep disturbance and suicidal ideas. The patient is not nervous/anxious.   BREAST: No symptoms   Objective: BP 110/60   Ht 5' 9.5" (1.765 m)   Wt (!) 302 lb (137 kg)   LMP 08/25/2020 (Exact Date)   BMI 43.96 kg/m    Physical Exam Constitutional:      Appearance: She is well-developed.  Genitourinary:     Vulva normal.     Right Labia: No rash, tenderness or lesions.    Left  Labia: No tenderness, lesions or rash.    No vaginal discharge, erythema or tenderness.      Right Adnexa: not tender and no mass present.    Left Adnexa: not tender and no mass present.    No cervical motion tenderness, friability or polyp.     Uterus is not enlarged or tender.  Breasts:    Right: No mass, nipple discharge, skin change or tenderness.     Left: No mass, nipple discharge, skin change or tenderness.  Neck:     Thyroid: No thyromegaly.  Cardiovascular:     Rate and Rhythm:  Normal rate and regular rhythm.     Heart sounds: Normal heart sounds. No murmur heard. Pulmonary:     Effort: Pulmonary effort is normal.     Breath sounds: Normal breath sounds.  Abdominal:     Palpations: Abdomen is soft.     Tenderness: There is no abdominal tenderness. There is no guarding or rebound.  Musculoskeletal:        General: Normal range of motion.     Cervical back: Normal range of motion.  Lymphadenopathy:     Cervical: No cervical adenopathy.  Neurological:     General: No focal deficit present.     Mental Status: She is alert and oriented to person, place, and time.     Cranial Nerves: No cranial nerve deficit.  Skin:    General: Skin is warm and dry.  Psychiatric:        Mood and Affect: Mood normal.        Behavior: Behavior normal.        Thought Content: Thought content normal.        Judgment: Judgment normal.  Vitals reviewed.    Assessment/Plan: Encounter for annual routine gynecological examination  Cervical cancer screening - Plan: Cytology - PAP,   Screening for STD (sexually transmitted disease) - Plan: Cytology - PAP, HIV Antibody (routine testing w rflx), RPR, Hepatitis C antibody  Screening for HPV (human papillomavirus) - Plan: Cytology - PAP,   Encounter for screening mammogram for malignant neoplasm of breast; pt current on mammo  Menorrhagia with regular cycle - Plan: tranexamic acid (LYSTEDA) 650 MG TABS tablet, CBC with Differential/Platelet,  TSH, T4, free; Rx RF lysteda. Pt to f/u with MD for ablation vs hyst conf if desires further mgmt.   Pre-diabetes - Plan: Hemoglobin A1c; check labs. Will f/u with results.   Family history of hypothyroidism - Plan: TSH, T4, free  Anxiety and depression--pt on lexapro 20 mg. Check labs. Suggested adding exercise to help with sx.   PMDD (premenstrual dysphoric disorder)--can increase lysteda to 30 mg lexapro phase to see if sx improved. Can re-write Rx if needed. Discussed that hyst (without BSO and no indication for that currently) won't help with PMDD sx. Increase exercise.   Meds ordered this encounter  Medications   tranexamic acid (LYSTEDA) 650 MG TABS tablet    Sig: Take 2 tablets (1,300 mg total) by mouth 3 (three) times daily. Take during menses for a maximum of five days    Dispense:  30 tablet    Refill:  12    Order Specific Question:   Supervising Provider    Answer:   Gae Dry [id number]     GYN counsel breast self exam, mammography screening, adequate intake of calcium and vitamin D, diet and exercise     F/U  Return in about 1 year (around 08/31/2021).  Erin Popescu B. Whalen Trompeter, PA-C 08/31/2020 3:55 PM

## 2020-09-01 ENCOUNTER — Encounter: Payer: Self-pay | Admitting: Nurse Practitioner

## 2020-09-01 ENCOUNTER — Institutional Professional Consult (permissible substitution): Payer: BC Managed Care – PPO | Admitting: Plastic Surgery

## 2020-09-01 LAB — CBC WITH DIFFERENTIAL/PLATELET
Basophils Absolute: 0.1 10*3/uL (ref 0.0–0.2)
Basos: 1 %
EOS (ABSOLUTE): 0.6 10*3/uL — ABNORMAL HIGH (ref 0.0–0.4)
Eos: 6 %
Hematocrit: 42.7 % (ref 34.0–46.6)
Hemoglobin: 14.3 g/dL (ref 11.1–15.9)
Immature Grans (Abs): 0.1 10*3/uL (ref 0.0–0.1)
Immature Granulocytes: 1 %
Lymphocytes Absolute: 2.2 10*3/uL (ref 0.7–3.1)
Lymphs: 22 %
MCH: 31.7 pg (ref 26.6–33.0)
MCHC: 33.5 g/dL (ref 31.5–35.7)
MCV: 95 fL (ref 79–97)
Monocytes Absolute: 0.5 10*3/uL (ref 0.1–0.9)
Monocytes: 5 %
Neutrophils Absolute: 6.5 10*3/uL (ref 1.4–7.0)
Neutrophils: 65 %
Platelets: 367 10*3/uL (ref 150–450)
RBC: 4.51 x10E6/uL (ref 3.77–5.28)
RDW: 12.4 % (ref 11.7–15.4)
WBC: 9.9 10*3/uL (ref 3.4–10.8)

## 2020-09-01 LAB — TSH: TSH: 2.38 u[IU]/mL (ref 0.450–4.500)

## 2020-09-01 LAB — T4, FREE: Free T4: 0.95 ng/dL (ref 0.82–1.77)

## 2020-09-01 LAB — HIV ANTIBODY (ROUTINE TESTING W REFLEX): HIV Screen 4th Generation wRfx: NONREACTIVE

## 2020-09-01 LAB — RPR: RPR Ser Ql: NONREACTIVE

## 2020-09-01 LAB — HEMOGLOBIN A1C
Est. average glucose Bld gHb Est-mCnc: 120 mg/dL
Hgb A1c MFr Bld: 5.8 % — ABNORMAL HIGH (ref 4.8–5.6)

## 2020-09-01 LAB — HEPATITIS C ANTIBODY: Hep C Virus Ab: <0.1 s/co ratio (ref 0.0–0.9)

## 2020-09-01 NOTE — Progress Notes (Signed)
Pt was due for labs with you but we couldn't use your lab order in our office. I added them on to my labs. Thanks.

## 2020-09-02 LAB — CYTOLOGY - PAP
Chlamydia: NEGATIVE
Comment: NEGATIVE
Comment: NEGATIVE
Comment: NORMAL
Diagnosis: NEGATIVE
High risk HPV: POSITIVE — AB
Neisseria Gonorrhea: NEGATIVE

## 2020-09-05 ENCOUNTER — Encounter: Payer: Self-pay | Admitting: Obstetrics and Gynecology

## 2020-09-14 ENCOUNTER — Ambulatory Visit (INDEPENDENT_AMBULATORY_CARE_PROVIDER_SITE_OTHER): Payer: BC Managed Care – PPO | Admitting: Nurse Practitioner

## 2020-09-14 ENCOUNTER — Encounter: Payer: Self-pay | Admitting: Nurse Practitioner

## 2020-09-14 VITALS — Ht 69.0 in | Wt 302.0 lb

## 2020-09-14 DIAGNOSIS — J301 Allergic rhinitis due to pollen: Secondary | ICD-10-CM | POA: Diagnosis not present

## 2020-09-14 DIAGNOSIS — K219 Gastro-esophageal reflux disease without esophagitis: Secondary | ICD-10-CM | POA: Diagnosis not present

## 2020-09-14 DIAGNOSIS — F411 Generalized anxiety disorder: Secondary | ICD-10-CM | POA: Diagnosis not present

## 2020-09-14 DIAGNOSIS — J0140 Acute pansinusitis, unspecified: Secondary | ICD-10-CM | POA: Diagnosis not present

## 2020-09-14 MED ORDER — MONTELUKAST SODIUM 10 MG PO TABS: 10.0000 mg | ORAL_TABLET | Freq: Every day | ORAL | 1 refills | Status: DC

## 2020-09-14 MED ORDER — ALPRAZOLAM 1 MG PO TABS: ORAL_TABLET | ORAL | 2 refills | Status: DC

## 2020-09-14 MED ORDER — CEFUROXIME AXETIL 500 MG PO TABS: 500.0000 mg | ORAL_TABLET | Freq: Two times a day (BID) | ORAL | 0 refills | Status: DC

## 2020-09-14 MED ORDER — OMEPRAZOLE 20 MG PO CPDR: 20.0000 mg | DELAYED_RELEASE_CAPSULE | Freq: Two times a day (BID) | ORAL | 1 refills | Status: DC

## 2020-09-14 MED ORDER — AZELASTINE HCL 0.1 % NA SOLN: 2.0000 | Freq: Two times a day (BID) | NASAL | 5 refills | Status: DC

## 2020-09-14 NOTE — Progress Notes (Signed)
Reviewed labs with patient during visit for sinusitis. Patient voiced understanding of all results.

## 2020-09-14 NOTE — Progress Notes (Signed)
Virtual Visit via Telephone Note  I connected with Erin Mcdonald on 09/14/20 at  1:50 PM EDT by telephone and verified that I am speaking with the correct person using two identifiers.  Location: Patient: home Provider: [employer] [facility]     I discussed the limitations, risks, security and privacy concerns of performing an evaluation and management service by telephone and the availability of in person appointments. I also discussed with the patient that there may be a patient responsible charge related to this service. The patient expressed understanding and agreed to proceed.   History of Present Illness: The patient presents for acute visit.  She states that yesterday she woke up with chest tightness and chest congestion.  She has productive cough with green mucus.  She has pain in her left ear.  She states that her head feels very congested and heavy.  She denies fever, chills, body aches, or malaise.  She denies nausea or vomiting.  States that she took home test for COVID-19 yesterday and it was negative.  She does have history of chronic allergic rhinitis.  She will often get sinus infections with weather changes especially rain and damp weather. She asked to have refills of routine medications.  She would also like to discuss recent lab work.  Her hemoglobin A1c had been elevated at 5.7 with her other labs being normal.  She did have Pap smear done per GYN provider.  It was positive for HPV without any cellular changes.  She will have to repeat this in 1 year.   Observations/Objective:  The patient is alert and oriented. She is pleasant and answers all questions appropriately. Breathing is non-labored. She is in no acute distress at this time.  The patient is nasally congested.  She has loose sounding cough which is productive.  Today's Vitals   09/14/20 1340  Weight: (!) 302 lb (137 kg)  Height: '5\' 9"'$  (1.753 m)   Body mass index is 44.6 kg/m.   Assessment and  Plan: 1. Acute non-recurrent pansinusitis Start Ceftin 500 mg tablets twice daily for next 7 days. Rest and increase fluids. Continue using OTC medication to control symptoms.  Recommend Delsym, over-the-counter cough suppressant, to take twice daily as needed for cough.  Also recommended that she repeat testing for COVID-19 in 24 to 48 hours if symptoms are persistent or worsen.  Patient voiced understanding and agreement with all instructions. - cefUROXime (CEFTIN) 500 MG tablet; Take 1 tablet (500 mg total) by mouth 2 (two) times daily with a meal.  Dispense: 14 tablet; Refill: 0  2. Seasonal allergic rhinitis due to pollen Patient should continue all allergy medications refilled Astelin nasal spray and Singulair tablets. - azelastine (ASTELIN) 0.1 % nasal spray; Place 2 sprays into both nostrils 2 (two) times daily. Use in each nostril as directed  Dispense: 30 mL; Refill: 5 - montelukast (SINGULAIR) 10 MG tablet; Take 1 tablet (10 mg total) by mouth at bedtime.  Dispense: 90 tablet; Refill: 1  3. Gastroesophageal reflux disease without esophagitis She may continue Prilosec twice daily.,  90-day prescription sent to her pharmacy today. - omeprazole (PRILOSEC) 20 MG capsule; Take 1 capsule (20 mg total) by mouth 2 (two) times daily.  Dispense: 180 capsule; Refill: 1  4. GAD (generalized anxiety disorder) Patient should continue Lexapro 20 mg tablets daily.  She may take alprazolam 1 mg up to 3 times daily as needed for acute anxiety.  Have discussed referral to psychiatry multiple times  in the past due to his severe anxiety.  Referrals have actually been placed.  Patient very reluctant to see a psychiatrist at this time.  Patient will need to sign a controlled substances agreement at her next in office visit. - ALPRAZolam (XANAX) 1 MG tablet; Take 1 tablet po TID prn anxiety  Dispense: 90 tablet; Refill: 2   Follow Up Instructions:    I discussed the assessment and treatment plan with the  patient. The patient was provided an opportunity to ask questions and all were answered. The patient agreed with the plan and demonstrated an understanding of the instructions.   The patient was advised to call back or seek an in-person evaluation if the symptoms worsen or if the condition fails to improve as anticipated.  I provided 15 minutes of non-face-to-face time during this encounter.   [name], NP

## 2020-09-17 ENCOUNTER — Encounter: Payer: Self-pay | Admitting: Nurse Practitioner

## 2020-09-17 ENCOUNTER — Other Ambulatory Visit: Payer: Self-pay | Admitting: Nurse Practitioner

## 2020-09-17 DIAGNOSIS — J0140 Acute pansinusitis, unspecified: Secondary | ICD-10-CM

## 2020-09-17 MED ORDER — AZITHROMYCIN 250 MG PO TABS: ORAL_TABLET | ORAL | 0 refills | Status: DC

## 2020-09-17 NOTE — Progress Notes (Signed)
Stop ceftin. Start z-pack. Take as directed for 5 days. Continue all symptom control methods thus far.

## 2020-09-22 DIAGNOSIS — G4733 Obstructive sleep apnea (adult) (pediatric): Secondary | ICD-10-CM | POA: Diagnosis not present

## 2020-09-30 ENCOUNTER — Other Ambulatory Visit: Payer: Self-pay | Admitting: Nurse Practitioner

## 2020-09-30 DIAGNOSIS — Z6841 Body Mass Index (BMI) 40.0 and over, adult: Secondary | ICD-10-CM

## 2020-09-30 DIAGNOSIS — R7303 Prediabetes: Secondary | ICD-10-CM

## 2020-09-30 MED ORDER — OZEMPIC (0.25 OR 0.5 MG/DOSE) 2 MG/1.5ML ~~LOC~~ SOPN: 0.2500 mg | PEN_INJECTOR | SUBCUTANEOUS | 3 refills | Status: DC

## 2020-09-30 NOTE — Progress Notes (Signed)
Trial of ozempic 0.25mg  weekly. Will increase to 0.5mg  weekly after 1 month if tolerating ok. New prescription sent to [facility] in [place].

## 2020-10-13 ENCOUNTER — Telehealth: Payer: Self-pay

## 2020-10-13 NOTE — Telephone Encounter (Signed)
Patient called and would like referred to a [facility] in [place]. She would like to go to [facility]. She needs [place] to send a referral to them for this.  Referral has been faxed to them along with records.

## 2020-10-20 ENCOUNTER — Encounter: Payer: Self-pay | Admitting: Nurse Practitioner

## 2020-10-20 ENCOUNTER — Other Ambulatory Visit: Payer: Self-pay | Admitting: Nurse Practitioner

## 2020-10-20 DIAGNOSIS — Z6841 Body Mass Index (BMI) 40.0 and over, adult: Secondary | ICD-10-CM

## 2020-10-20 DIAGNOSIS — R7301 Impaired fasting glucose: Secondary | ICD-10-CM

## 2020-10-20 MED ORDER — PEN NEEDLES 32G X 5 MM MISC: 3 refills | Status: DC

## 2020-10-20 MED ORDER — SAXENDA 18 MG/3ML ~~LOC~~ SOPN
PEN_INJECTOR | SUBCUTANEOUS | 2 refills | Status: DC
Start: 2020-10-20 — End: 2020-11-17

## 2020-10-20 NOTE — Progress Notes (Signed)
Changed injectable medication from ozempic to saxenda. Per information from insurance, this should be covered. Start with injection of 0.6mg  daily. If this is tolerated well, this can be increased to 1.2mg . at the end of second week, this can be increased to 1.8mg  and son on until target dose of 3mg  daily is reached. If there is development of abdominal pain or moderate constipation at any new dose, back down to prior dosing and go more gradually to the next dose. New prescription sent to [facility].

## 2020-10-22 DIAGNOSIS — G4733 Obstructive sleep apnea (adult) (pediatric): Secondary | ICD-10-CM | POA: Diagnosis not present

## 2020-10-25 ENCOUNTER — Other Ambulatory Visit: Payer: Self-pay

## 2020-10-25 DIAGNOSIS — R2232 Localized swelling, mass and lump, left upper limb: Secondary | ICD-10-CM

## 2020-11-15 NOTE — Telephone Encounter (Signed)
Have you gotten anything since I did that letter of medical neccessity?

## 2020-11-17 ENCOUNTER — Other Ambulatory Visit: Payer: Self-pay | Admitting: Nurse Practitioner

## 2020-11-17 DIAGNOSIS — Z6841 Body Mass Index (BMI) 40.0 and over, adult: Secondary | ICD-10-CM

## 2020-11-17 MED ORDER — PHENTERMINE HCL 37.5 MG PO TABS: 37.5000 mg | ORAL_TABLET | Freq: Every day | ORAL | 1 refills | Status: DC

## 2020-11-22 DIAGNOSIS — G4733 Obstructive sleep apnea (adult) (pediatric): Secondary | ICD-10-CM | POA: Diagnosis not present

## 2020-11-25 ENCOUNTER — Encounter: Payer: Self-pay | Admitting: Nurse Practitioner

## 2020-11-25 ENCOUNTER — Other Ambulatory Visit: Payer: Self-pay | Admitting: Nurse Practitioner

## 2020-11-25 DIAGNOSIS — B379 Candidiasis, unspecified: Secondary | ICD-10-CM

## 2020-11-25 MED ORDER — FLUCONAZOLE 150 MG PO TABS: ORAL_TABLET | ORAL | 0 refills | Status: DC

## 2020-12-06 ENCOUNTER — Telehealth: Payer: Self-pay | Admitting: Nurse Practitioner

## 2020-12-06 NOTE — Telephone Encounter (Signed)
Patient left a message she was returning your call, I have looked and I did not see anything so I told her I would have you call her back. [phone]

## 2020-12-07 NOTE — Telephone Encounter (Signed)
Called pt she is aware of her letter of approval for her Rx.

## 2020-12-21 ENCOUNTER — Other Ambulatory Visit: Payer: Self-pay

## 2020-12-21 DIAGNOSIS — F321 Major depressive disorder, single episode, moderate: Secondary | ICD-10-CM

## 2020-12-21 MED ORDER — ESCITALOPRAM OXALATE 20 MG PO TABS: ORAL_TABLET | ORAL | 1 refills | Status: DC

## 2020-12-29 ENCOUNTER — Ambulatory Visit: Payer: BC Managed Care – PPO | Admitting: Nurse Practitioner

## 2020-12-29 ENCOUNTER — Institutional Professional Consult (permissible substitution): Payer: BC Managed Care – PPO | Admitting: Plastic Surgery

## 2021-01-05 ENCOUNTER — Ambulatory Visit (INDEPENDENT_AMBULATORY_CARE_PROVIDER_SITE_OTHER): Payer: BC Managed Care – PPO | Admitting: Nurse Practitioner

## 2021-01-05 ENCOUNTER — Encounter: Payer: Self-pay | Admitting: Nurse Practitioner

## 2021-01-05 VITALS — Ht 69.0 in | Wt 302.0 lb

## 2021-01-05 DIAGNOSIS — U071 COVID-19: Secondary | ICD-10-CM | POA: Diagnosis not present

## 2021-01-05 DIAGNOSIS — J301 Allergic rhinitis due to pollen: Secondary | ICD-10-CM | POA: Diagnosis not present

## 2021-01-05 DIAGNOSIS — K219 Gastro-esophageal reflux disease without esophagitis: Secondary | ICD-10-CM | POA: Diagnosis not present

## 2021-01-05 DIAGNOSIS — Z6841 Body Mass Index (BMI) 40.0 and over, adult: Secondary | ICD-10-CM | POA: Diagnosis not present

## 2021-01-05 DIAGNOSIS — F321 Major depressive disorder, single episode, moderate: Secondary | ICD-10-CM

## 2021-01-05 DIAGNOSIS — F411 Generalized anxiety disorder: Secondary | ICD-10-CM

## 2021-01-05 DIAGNOSIS — J069 Acute upper respiratory infection, unspecified: Secondary | ICD-10-CM

## 2021-01-05 DIAGNOSIS — B009 Herpesviral infection, unspecified: Secondary | ICD-10-CM

## 2021-01-05 MED ORDER — OMEPRAZOLE 20 MG PO CPDR: 20.0000 mg | DELAYED_RELEASE_CAPSULE | Freq: Two times a day (BID) | ORAL | 1 refills | Status: DC

## 2021-01-05 MED ORDER — ESCITALOPRAM OXALATE 20 MG PO TABS: ORAL_TABLET | ORAL | 1 refills | Status: DC

## 2021-01-05 MED ORDER — MOLNUPIRAVIR EUA 200MG CAPSULE: 4.0000 | ORAL_CAPSULE | Freq: Two times a day (BID) | ORAL | 0 refills | Status: AC

## 2021-01-05 MED ORDER — ALPRAZOLAM 1 MG PO TABS: ORAL_TABLET | ORAL | 2 refills | Status: DC

## 2021-01-05 MED ORDER — VALACYCLOVIR HCL 1 G PO TABS: 1000.0000 mg | ORAL_TABLET | Freq: Every day | ORAL | 5 refills | Status: DC

## 2021-01-05 MED ORDER — MONTELUKAST SODIUM 10 MG PO TABS: 10.0000 mg | ORAL_TABLET | Freq: Every day | ORAL | 1 refills | Status: DC

## 2021-01-05 NOTE — Patient Instructions (Signed)
Fat and Cholesterol Restricted Eating Plan Getting too much fat and cholesterol in your diet may cause health problems. Choosing the right foods helps keep your fat and cholesterol at normal levels. This can keep you from getting certain diseases. Your doctor may recommend an eating plan that includes: Total fat: ______% or less of total calories a day. This is ______g of fat a day. Saturated fat: ______% or less of total calories a day. This is ______g of saturated fat a day. Cholesterol: less than _________mg a day. Fiber: ______g a day. What are tips for following this plan? General tips Work with your doctor to lose weight if you need to. Avoid: Foods with added sugar. Fried foods. Foods with trans fat or partially hydrogenated oils. This includes some margarines and baked goods. If you drink alcohol: Limit how much you have to: 0-1 drink a day for women who are not pregnant. 0-2 drinks a day for men. Know how much alcohol is in a drink. In the [place] one drink equals one 12 oz bottle of beer (355 mL), one 5 oz glass of wine (148 mL), or one 1 oz glass of hard liquor (44 mL). Reading food labels Check food labels for: Trans fats. Partially hydrogenated oils. Saturated fat (g) in each serving. Cholesterol (mg) in each serving. Fiber (g) in each serving. Choose foods with healthy fats, such as: Monounsaturated fats and polyunsaturated fats. These include olive and canola oil, flaxseeds, walnuts, almonds, and seeds. Omega-3 fats. These are found in certain fish, flaxseed oil, and ground flaxseeds. Choose grain products that have whole grains. Look for the word "whole" as the first word in the ingredient list. Cooking Cook foods using low-fat methods. These include baking, boiling, grilling, and broiling. Eat more home-cooked foods. Eat at restaurants and buffets less often. Eat less fast food. Avoid cooking using saturated fats, such as butter, cream, palm oil, palm kernel oil, and  coconut oil. Meal planning  At meals, divide your plate into four equal parts: Fill one-half of your plate with vegetables, green salads, and fruit. Fill one-fourth of your plate with whole grains. Fill one-fourth of your plate with low-fat (lean) protein foods. Eat fish that is high in omega-3 fats at least two times a week. This includes mackerel, tuna, sardines, and salmon. Eat foods that are high in fiber, such as whole grains, beans, apples, pears, berries, broccoli, carrots, peas, and barley. What foods should I eat? Fruits All fresh, canned (in natural juice), or frozen fruits. Vegetables Fresh or frozen vegetables (raw, steamed, roasted, or grilled). Green salads. Grains Whole grains, such as whole wheat or whole grain breads, crackers, cereals, and pasta. Unsweetened oatmeal, bulgur, barley, quinoa, or brown rice. Corn or whole wheat flour tortillas. Meats and other protein foods Ground beef (85% or leaner), grass-fed beef, or beef trimmed of fat. Skinless chicken or turkey. Ground chicken or turkey. Pork trimmed of fat. All fish and seafood. Egg whites. Dried beans, peas, or lentils. Unsalted nuts or seeds. Unsalted canned beans. Nut butters without added sugar or oil. Dairy Low-fat or nonfat dairy products, such as skim or 1% milk, 2% or reduced-fat cheeses, low-fat and fat-free ricotta or cottage cheese, or plain low-fat and nonfat yogurt. Fats and oils Tub margarine without trans fats. Light or reduced-fat mayonnaise and salad dressings. Avocado. Olive, canola, sesame, or safflower oils. The items listed above may not be a complete list of foods and beverages you can eat. Contact a dietitian for more information. What foods   should I avoid? Fruits Canned fruit in heavy syrup. Fruit in cream or butter sauce. Fried fruit. Vegetables Vegetables cooked in cheese, cream, or butter sauce. Fried vegetables. Grains White bread. White pasta. White rice. Cornbread. Bagels, pastries,  and croissants. Crackers and snack foods that contain trans fat and hydrogenated oils. Meats and other protein foods Fatty cuts of meat. Ribs, chicken wings, bacon, sausage, bologna, salami, chitterlings, fatback, hot dogs, bratwurst, and packaged lunch meats. Liver and organ meats. Whole eggs and egg yolks. Chicken and turkey with skin. Fried meat. Dairy Whole or 2% milk, cream, half-and-half, and cream cheese. Whole milk cheeses. Whole-fat or sweetened yogurt. Full-fat cheeses. Nondairy creamers and whipped toppings. Processed cheese, cheese spreads, and cheese curds. Fats and oils Butter, stick margarine, lard, shortening, ghee, or bacon fat. Coconut, palm kernel, and palm oils. Beverages Alcohol. Sugar-sweetened drinks such as sodas, lemonade, and fruit drinks. Sweets and desserts Corn syrup, sugars, honey, and molasses. Candy. Jam and jelly. Syrup. Sweetened cereals. Cookies, pies, cakes, donuts, muffins, and ice cream. The items listed above may not be a complete list of foods and beverages you should avoid. Contact a dietitian for more information. Summary Choosing the right foods helps keep your fat and cholesterol at normal levels. This can keep you from getting certain diseases. At meals, fill one-half of your plate with vegetables, green salads, and fruits. Eat high fiber foods, like whole grains, beans, apples, pears, berries, carrots, peas, and barley. Limit added sugar, saturated fats, alcohol, and fried foods. This information is not intended to replace advice given to you by your health care provider. Make sure you discuss any questions you have with your health care provider. Document Revised: 05/07/2020 Document Reviewed: 05/07/2020 Elsevier Patient Education  2022 [employer]  

## 2021-01-05 NOTE — Progress Notes (Signed)
Virtual Visit via Telephone Note  I connected with Addilynne Olheiser on 01/10/21 at  3:10 PM EST by telephone and verified that I am speaking with the correct person using two identifiers.  Location: Patient: home Provider: [place] primary care at [facility]     I discussed the limitations, risks, security and privacy concerns of performing an evaluation and management service by telephone and the availability of in person appointments. I also discussed with the patient that there may be a patient responsible charge related to this service. The patient expressed understanding and agreed to proceed.   History of Present Illness: The patient presents for an acute visit.  She states that yesterday she started feeling nasal congestion along with sinus pain and tenderness.  Has had headache and mild fever.  States that symptoms have continued to get worse.  She did take a home COVID 19 test this morning.  Results were positive.  She has been taking over-the-counter medications to help with symptoms.  They help for short period time and then symptoms return.   She also has history of anxiety and depression.  She does need to have refills of routine medications.   Observations/Objective:  The patient is alert and oriented. She is pleasant and answers all questions appropriately. Breathing is non-labored. She is in no acute distress at this time.  The patient has nasal congestion and a loose sounding cough.  Today's Vitals   01/05/21 1505  Weight: (!) 302 lb (137 kg)  Height: 5\' 9"  (1.753 m)   Body mass index is 44.6 kg/m.   Assessment and Plan: 1. Upper respiratory tract infection due to COVID-19 virus Patient with home test for COVID-19 positive this morning.  We will start molnupiravir 800 mg twice daily for next 5 days. Rest and increase fluids. Continue using OTC medication to control symptoms.  Recommend she also take vitamins C, D, and zinc.  Reviewed quarantine and isolation procedures  recommended by CDC.  She voiced understanding and agreement.  States she will seek emergency care if she becomes short of breath or has oxygen desaturations that do not improve with deep breathing. - molnupiravir EUA (LAGEVRIO) 200 mg CAPS capsule; Take 4 capsules (800 mg total) by mouth 2 (two) times daily for 5 days.  Dispense: 40 capsule; Refill: 0  2. Seasonal allergic rhinitis due to pollen Continue Singulair and other medications for allergies as previously prescribed. - montelukast (SINGULAIR) 10 MG tablet; Take 1 tablet (10 mg total) by mouth at bedtime.  Dispense: 90 tablet; Refill: 1  3. Gastroesophageal reflux disease without esophagitis Stable.  Continue omeprazole twice daily. - omeprazole (PRILOSEC) 20 MG capsule; Take 1 capsule (20 mg total) by mouth 2 (two) times daily.  Dispense: 180 capsule; Refill: 1  4. Body mass index (BMI) of 40.1-44.9 in adult [facility]) Discussed lowering calorie intake to 1500 calories per day and incorporating exercise into daily routine to help lose weight.  Patient is now taking [place] daily.  We will have follow-up in office in 3 months to evaluate progress.  5. GAD (generalized anxiety disorder) Continue Lexapro daily.  May take alprazolam 1 mg up to 3 times daily as needed for acute anxiety.  New prescription was sent to her pharmacy today. - ALPRAZolam (XANAX) 1 MG tablet; Take 1 tablet po TID prn anxiety  Dispense: 90 tablet; Refill: 2  6. Depression, major, single episode, moderate (HCC) Continue Lexapro 20 mg daily. - escitalopram (LEXAPRO) 20 MG tablet; Take 1 tablet po  QD  Dispense: 90 tablet; Refill: 1  7. Herpes simplex disease Continue valacyclovir daily.  We will hold while taking antivirals for COVID-19. - valACYclovir (VALTREX) 1000 MG tablet; Take 1 tablet (1,000 mg total) by mouth daily.  Dispense: 30 tablet; Refill: 5   Follow Up Instructions:    I discussed the assessment and treatment plan with the patient. The patient was  provided an opportunity to ask questions and all were answered. The patient agreed with the plan and demonstrated an understanding of the instructions.   The patient was advised to call back or seek an in-person evaluation if the symptoms worsen or if the condition fails to improve as anticipated.  I provided 15 minutes of non-face-to-face time during this encounter.   [name], NP   This note was dictated using Systems analyst. Rapid proofreading was performed to expedite the delivery of the information. Despite proofreading, phonetic errors will occur which are common with this voice recognition software. Please take this into consideration. If there are any concerns, please contact our office.

## 2021-01-10 DIAGNOSIS — U071 COVID-19: Secondary | ICD-10-CM | POA: Insufficient documentation

## 2021-01-10 DIAGNOSIS — J069 Acute upper respiratory infection, unspecified: Secondary | ICD-10-CM | POA: Insufficient documentation

## 2021-01-10 DIAGNOSIS — Z6841 Body Mass Index (BMI) 40.0 and over, adult: Secondary | ICD-10-CM | POA: Insufficient documentation

## 2021-01-12 ENCOUNTER — Ambulatory Visit: Payer: BC Managed Care – PPO | Admitting: Nurse Practitioner

## 2021-01-17 ENCOUNTER — Other Ambulatory Visit: Payer: Self-pay

## 2021-01-17 ENCOUNTER — Ambulatory Visit: Payer: BC Managed Care – PPO

## 2021-01-17 ENCOUNTER — Ambulatory Visit (INDEPENDENT_AMBULATORY_CARE_PROVIDER_SITE_OTHER): Payer: BC Managed Care – PPO

## 2021-01-17 ENCOUNTER — Ambulatory Visit
Admission: RE | Admit: 2021-01-17 | Discharge: 2021-01-17 | Disposition: A | Discharge status: Home / Self Care | Payer: BC Managed Care – PPO | Source: Ambulatory Visit | Attending: Emergency Medicine | Admitting: Emergency Medicine

## 2021-01-17 VITALS — BP 116/76 | HR 90 | Temp 97.5°F | Resp 16 | Ht 69.0 in | Wt 302.0 lb

## 2021-01-17 DIAGNOSIS — B3731 Acute candidiasis of vulva and vagina: Secondary | ICD-10-CM | POA: Diagnosis not present

## 2021-01-17 DIAGNOSIS — B9689 Other specified bacterial agents as the cause of diseases classified elsewhere: Secondary | ICD-10-CM

## 2021-01-17 DIAGNOSIS — N760 Acute vaginitis: Secondary | ICD-10-CM

## 2021-01-17 DIAGNOSIS — R109 Unspecified abdominal pain: Secondary | ICD-10-CM | POA: Diagnosis not present

## 2021-01-17 LAB — URINALYSIS, COMPLETE (UACMP) WITH MICROSCOPIC
Bilirubin Urine: NEGATIVE
Glucose, UA: NEGATIVE mg/dL
Ketones, ur: NEGATIVE mg/dL
Nitrite: NEGATIVE
Protein, ur: NEGATIVE mg/dL
Specific Gravity, Urine: 1.02 (ref 1.005–1.030)
pH: 5.5 (ref 5.0–8.0)

## 2021-01-17 LAB — WET PREP, GENITAL
Sperm: NONE SEEN
Trich, Wet Prep: NONE SEEN
WBC, Wet Prep HPF POC: <10 — AB (ref <10)

## 2021-01-17 MED ORDER — FLUCONAZOLE 200 MG PO TABS: 200.0000 mg | ORAL_TABLET | Freq: Every day | ORAL | 1 refills | Status: AC

## 2021-01-17 MED ORDER — METRONIDAZOLE 500 MG PO TABS: 500.0000 mg | ORAL_TABLET | Freq: Two times a day (BID) | ORAL | 0 refills | Status: DC

## 2021-01-17 NOTE — Discharge Instructions (Addendum)
Take the Flagyl (metronidazole) 500 mg twice daily for treatment of your bacterial vaginosis.  Avoid alcohol while on the metronidazole as taken together will cause of vomiting.  Bacterial vaginosis is often caused by a imbalance of bacteria in your vaginal vault.  This is sometimes a result of using tampons or hormonal fluctuations during her menstrual cycle.  You if your symptoms are recurrent you can try using a boric acid suppository twice weekly to help maintain the acid-base balance in your vagina vault which could prevent further infection.  You can also try vaginal probiotics to help return normal bacterial balance.   For your vaginal yeast infection take 1 Diflucan tablet now and repeat dosing in 1 week if your symptoms continue.  If you do have continuing symptoms please return for reevaluation, see your primary care provider, or see your OB/GYN.

## 2021-01-17 NOTE — ED Triage Notes (Signed)
Pt c/o left upper quadrant pain, pt states that she feels bloated, and nausea. Sxs x 1 week.

## 2021-01-17 NOTE — ED Provider Notes (Signed)
[facility]    CSN: [id number] Arrival date & time: 01/17/21  1545      History   Chief Complaint Chief Complaint  Patient presents with   Abdominal Pain    Appt at 4pm     HPI Erin Mcdonald is a 46 y.o. female.   HPI  46 year old female here for evaluation of left flank pain.  Patient reports that she has been experiencing pain in her left upper quadrant of her abdomen that radiates through to her left flank for the past week.  She also reports being nauseous and feeling bloated.  She denies any fever, pain with urination, notes any blood in her urine, vomiting, or history of kidney stones.  She does endorse urinary urgency and frequency.  She states that she does have an appetite and she is unsure if she has any worsening of her pain with eating.  Past Medical History:  Diagnosis Date   Anxiety    Depression    Genital herpes    Sleep apnea     Patient Active Problem List   Diagnosis Date Noted   Body mass index (BMI) of 40.1-44.9 in adult [facility]) 01/10/2021   Upper respiratory tract infection due to COVID-19 virus 01/10/2021   Pre-diabetes 08/31/2020   Acute non-recurrent pansinusitis 05/06/2020   Encounter to establish care 04/06/2020   Local superficial swelling, mass or lump 04/06/2020   Seasonal allergic rhinitis due to pollen 04/06/2020   Loud snoring 04/06/2020   Abnormal mammogram of right breast 01/22/2020   Vitamin B12 deficiency 02/26/2019   Urinary tract infection with hematuria 08/17/2018   Dysuria 08/17/2018   Other fatigue 09/10/2017   Vitamin D deficiency 09/10/2017   Impaired fasting glucose 09/10/2017   Abnormal weight gain 09/10/2017   Contusion of wrist 07/23/2017   OSA (obstructive sleep apnea) 07/23/2017   Family history of colon cancer 07/23/2017   Menorrhagia with regular cycle 07/23/2017   Atopic dermatitis 06/10/2017   Depression, major, single episode, moderate ([place]) 06/10/2017   Candidal vaginitis 02/21/2017   Major  depressive disorder, recurrent, moderate ([place]) 01/18/2017   GAD (generalized anxiety disorder) 01/18/2017   Gastroesophageal reflux disease 01/18/2017   Chronic venous insufficiency 06/14/2016   Bilateral lower extremity edema 04/12/2016   Varicose veins of bilateral lower extremities with pain 04/12/2016   Iron deficiency anemia 08/27/2014   Encounter for long-term (current) use of medications 08/15/2012   Herpes simplex disease 11/02/2008   OBESITY 04/14/2008   DEPRESSION 03/10/2008   WARTS, HAND 02/04/2008   ANXIETY 10/31/2007   SNORING 03/27/2007   URI 02/25/2007    Past Surgical History:  Procedure Laterality Date   LAPAROSCOPIC GASTRIC SLEEVE RESECTION  11/15/2012   [facility], Dr Leonie Green [place]   TONSILLECTOMY  2013    OB History     Gravida  0   Para  0   Term  0   Preterm  0   AB  0   Living  0      SAB  0   IAB  0   Ectopic  0   Multiple  0   Live Births  0            Home Medications    Prior to Admission medications   Medication Sig Start Date End Date Taking? Authorizing Provider  fluconazole (DIFLUCAN) 200 MG tablet Take 1 tablet (200 mg total) by mouth daily for 2 doses. 01/17/21 01/19/21 Yes Margarette Canada, NP  metroNIDAZOLE (FLAGYL) 500 MG  tablet Take 1 tablet (500 mg total) by mouth 2 (two) times daily. 01/17/21  Yes Margarette Canada, NP  ALPRAZolam Duanne Moron) 1 MG tablet Take 1 tablet po TID prn anxiety 01/05/21   [name], NP  azelastine (ASTELIN) 0.1 % nasal spray Place 2 sprays into both nostrils 2 (two) times daily. Use in each nostril as directed 09/14/20   [name], NP  cyanocobalamin (,VITAMIN B-12,) 1000 MCG/ML injection Inject 27ml IM once weekly for 6 weeks then inject 38ml IM monthly 06/17/20   [name], NP  escitalopram (LEXAPRO) 20 MG tablet Take 1 tablet po QD 01/05/21   [name], NP  Insulin Pen Needle (PEN NEEDLES) 32G X 5 MM MISC To use daily with dosage of saxenda 10/20/20   [name], NP   montelukast (SINGULAIR) 10 MG tablet Take 1 tablet (10 mg total) by mouth at bedtime. 01/05/21   [name], NP  omeprazole (PRILOSEC) 20 MG capsule Take 1 capsule (20 mg total) by mouth 2 (two) times daily. 01/05/21   [name], NP  Syringe/Needle, Disp, (SYRINGE 3CC/25GX1") 25G X 1" 3 ML MISC Use for IM b12 injections. 06/17/20   [name], NP  tranexamic acid (LYSTEDA) 650 MG TABS tablet Take 2 tablets (1,300 mg total) by mouth 3 (three) times daily. Take during menses for a maximum of five days 6/62/94   Copland, Alicia B, PA-C  valACYclovir (VALTREX) 1000 MG tablet Take 1 tablet (1,000 mg total) by mouth daily. 01/05/21   [name], NP    Family History Family History  Problem Relation Age of Onset   Heart attack Mother    Diabetes Paternal Grandmother    Diabetes Paternal Grandfather    Colon cancer Father 74   Breast cancer Neg Hx    Ovarian cancer Neg Hx     Social History Social History   Tobacco Use   Smoking status: Every Day    Packs/day: 1.00    Types: Cigarettes   Smokeless tobacco: Never   Tobacco comments:    0.5-1ppd 08/25/2020  Vaping Use   Vaping Use: Never used  Substance Use Topics   Alcohol use: Yes    Comment: occasionally   Drug use: No     Allergies   Codeine   Review of Systems Review of Systems  Constitutional:  Negative for activity change, appetite change and fever.  Gastrointestinal:  Positive for abdominal pain and nausea. Negative for diarrhea and vomiting.  Genitourinary:  Positive for flank pain, frequency and urgency. Negative for dysuria, hematuria, vaginal discharge and vaginal pain.  Musculoskeletal:  Negative for back pain.  Skin:  Negative for rash.  Hematological: Negative.   Psychiatric/Behavioral: Negative.      Physical Exam Triage Vital Signs ED Triage Vitals  Enc Vitals Group     BP 01/17/21 1610 116/76     Pulse Rate 01/17/21 1610 90     Resp 01/17/21 1609 16     Temp 01/17/21 1610  (!) 97.5 F (36.4 C)     Temp Source 01/17/21 1609 Oral     SpO2 01/17/21 1610 100 %     Weight 01/17/21 1608 (!) 302 lb 0.5 oz (137 kg)     Height 01/17/21 1608 5\' 9"  (1.753 m)     Head Circumference --      Peak Flow --      Pain Score 01/17/21 1608 7     Pain Loc --  Pain Edu? --      Excl. in [place]? --    No data found.  Updated Vital Signs BP 116/76 (BP Location: Left Arm)    Pulse 90    Temp (!) 97.5 F (36.4 C) (Oral)    Resp 16    Ht 5\' 9"  (1.753 m)    Wt (!) 302 lb 0.5 oz (137 kg)    LMP 01/05/2021 (Exact Date) Comment: denies preg, signed waiver   SpO2 100%    BMI 44.60 kg/m   Visual Acuity Right Eye Distance:   Left Eye Distance:   Bilateral Distance:    Right Eye Near:   Left Eye Near:    Bilateral Near:     Physical Exam Vitals and nursing note reviewed.  Constitutional:      General: She is not in acute distress.    Appearance: Normal appearance. She is obese. She is not ill-appearing.  HENT:     Head: Normocephalic and atraumatic.  Cardiovascular:     Rate and Rhythm: Normal rate and regular rhythm.     Pulses: Normal pulses.     Heart sounds: Normal heart sounds. No murmur heard.   No friction rub. No gallop.  Pulmonary:     Effort: Pulmonary effort is normal.     Breath sounds: Normal breath sounds. No wheezing, rhonchi or rales.  Abdominal:     General: Bowel sounds are normal.     Palpations: Abdomen is soft.     Tenderness: There is abdominal tenderness. There is no right CVA tenderness, left CVA tenderness, guarding or rebound.  Skin:    General: Skin is warm and dry.     Capillary Refill: Capillary refill takes less than 2 seconds.     Findings: No erythema or rash.  Neurological:     General: No focal deficit present.     Mental Status: She is alert and oriented to person, place, and time.  Psychiatric:        Mood and Affect: Mood normal.        Behavior: Behavior normal.        Thought Content: Thought content normal.         Judgment: Judgment normal.     UC Treatments / Results  Labs (all labs ordered are listed, but only abnormal results are displayed) Labs Reviewed  WET PREP, GENITAL - Abnormal; Notable for the following components:      Result Value   Yeast Wet Prep HPF POC PRESENT (*)    Clue Cells Wet Prep HPF POC PRESENT (*)    WBC, Wet Prep HPF POC <10 (*)    All other components within normal limits  URINALYSIS, COMPLETE (UACMP) WITH MICROSCOPIC - Abnormal; Notable for the following components:   APPearance HAZY (*)    Hgb urine dipstick MODERATE (*)    Leukocytes,Ua TRACE (*)    Bacteria, UA MANY (*)    All other components within normal limits  URINE CULTURE    EKG   Radiology DG Abdomen 1 View  Result Date: 01/17/2021 CLINICAL DATA:  Left flank pain EXAM: ABDOMEN - 1 VIEW COMPARISON:  None. FINDINGS: The bowel gas pattern is normal. No radio-opaque calculi or other significant radiographic abnormality are seen. IMPRESSION: Negative. Electronically Signed   By: Yetta Glassman M.D.   On: 01/17/2021 17:28    Procedures Procedures (including critical care time)  Medications Ordered in UC Medications - No data to display  Initial Impression / Assessment and  Plan / UC Course  I have reviewed the triage vital signs and the nursing notes.  Pertinent labs & imaging results that were available during my care of the patient were reviewed by me and considered in my medical decision making (see chart for details).  Patient is a nontoxic-appearing 40 old female here for evaluation of left upper quadrant/left flank pain and nausea that started a week ago.  It is associated with some urinary urgency and frequency but patient denies any pain with urination, hematuria, fever, vomiting, and she denies any history of kidney stones.  The pain is not made worse with eating and patient endorses that she has an appetite.  Patient also denies any vaginal discharge or itching.  Physical exam reveals a benign  cardiopulmonary exam with clear lung sounds in all fields.  No CVA tenderness on exam.  Abdomen is protuberant but soft with positive bowel sounds in all 4 quadrants.  Patient does have mild epigastric and left upper quadrant tenderness.  She also has mild tenderness in the left lower quadrant.  Right upper quadrant and right lower quadrant are benign.  No guarding or rebound appreciated exam.  Urinalysis was collected at triage.  UA shows hazy appearance, moderate hemoglobin, trace leukocytes, 11-20 squamous epithelials and 6-10 WBCs.  Many bacteria and budding yeast present.  Given the moderate hemoglobin in the patient's urine we will do a KUB to look for the possibility of a renal stone.  Also with a budding yeast will collect a wet prep to look for vaginal infection as the source of the patient's symptoms.  KUB shows nonspecific bowel gas pattern.  There is a large stool collection in the ascending colon as well as in the left upper quadrant.  No renal calculi noted.  Radiology overread is pending. Radiology impression of KUB is no radiopaque calculi or other significant radiographic abnormality noted on exam.  Wet prep shows the presence of yeast and clue cells.  Will discharge patient home with a diagnosis of BV and yeast infection and treat her with metronidazole and Diflucan.  If her symptoms continue, or they worsen, she is to return for reevaluation or see her PCP.    Final Clinical Impressions(s) / UC Diagnoses   Final diagnoses:  BV (bacterial vaginosis)  Vaginal yeast infection   Discharge Instructions   None    ED Prescriptions     Medication Sig Dispense Auth. Provider   metroNIDAZOLE (FLAGYL) 500 MG tablet Take 1 tablet (500 mg total) by mouth 2 (two) times daily. 14 tablet Margarette Canada, NP   fluconazole (DIFLUCAN) 200 MG tablet Take 1 tablet (200 mg total) by mouth daily for 2 doses. 2 tablet Margarette Canada, NP      PDMP not reviewed this encounter.   Margarette Canada,  NP 01/17/21 1752

## 2021-01-19 ENCOUNTER — Telehealth (HOSPITAL_COMMUNITY): Payer: Self-pay | Admitting: Emergency Medicine

## 2021-01-19 LAB — URINE CULTURE: Culture: 90000 — AB

## 2021-01-19 MED ORDER — NITROFURANTOIN MONOHYD MACRO 100 MG PO CAPS: 100.0000 mg | ORAL_CAPSULE | Freq: Two times a day (BID) | ORAL | 0 refills | Status: DC

## 2021-01-29 ENCOUNTER — Encounter: Payer: Self-pay | Admitting: Obstetrics and Gynecology

## 2021-02-07 ENCOUNTER — Ambulatory Visit: Payer: BC Managed Care – PPO | Admitting: Obstetrics and Gynecology

## 2021-02-09 ENCOUNTER — Ambulatory Visit: Payer: BC Managed Care – PPO | Admitting: Obstetrics and Gynecology

## 2021-03-02 ENCOUNTER — Telehealth: Payer: Self-pay

## 2021-03-02 NOTE — Telephone Encounter (Signed)
Patient called and is ready to pursue surgical options for her AUB. I scheduled her with Dr Elly Modena for a hysterectomy consult. I advised that I do not know when her OR time will be. Patient understood. Pelvic US done in 03/2019.

## 2021-03-09 ENCOUNTER — Ambulatory Visit (INDEPENDENT_AMBULATORY_CARE_PROVIDER_SITE_OTHER): Payer: BC Managed Care – PPO | Admitting: Obstetrics and Gynecology

## 2021-03-09 ENCOUNTER — Encounter: Payer: Self-pay | Admitting: Obstetrics and Gynecology

## 2021-03-09 ENCOUNTER — Other Ambulatory Visit: Payer: Self-pay

## 2021-03-09 VITALS — BP 128/84 | Ht 69.0 in | Wt 295.0 lb

## 2021-03-09 DIAGNOSIS — N920 Excessive and frequent menstruation with regular cycle: Secondary | ICD-10-CM

## 2021-03-09 NOTE — Progress Notes (Signed)
47 yo P0 with LMP 03/02/21 and BMI 43 presenting for the evaluation of menorrhagia with regular cycles and dysmenorrhea. Patient had a trial of lysteda and reports initial improvement in her cycle but they have become heavy again. Her most significant complaint is she feels drained the week preceding her cycle and has to recover following her cycle. In her opinion, her cycles have taken away her quality of life and she is ready for a surgical intervention. Patient is not sexually active.  ? ?Past Medical History:  ?Diagnosis Date  ? Anxiety   ? Depression   ? Genital herpes   ? Sleep apnea   ? ?Past Surgical History:  ?Procedure Laterality Date  ? LAPAROSCOPIC GASTRIC SLEEVE RESECTION  11/15/2012  ? [facility], Dr Leda Gauze  ? TONSILLECTOMY  2013  ? ?Family History  ?Problem Relation Age of Onset  ? Heart attack Mother   ? Diabetes Paternal Grandmother   ? Diabetes Paternal Grandfather   ? Colon cancer Father 37  ? Breast cancer Neg Hx   ? Ovarian cancer Neg Hx   ? ?Social History  ? ?Tobacco Use  ? Smoking status: Every Day  ?  Packs/day: 1.00  ?  Types: Cigarettes  ? Smokeless tobacco: Never  ? Tobacco comments:  ?  0.5-1ppd 08/25/2020  ?Vaping Use  ? Vaping Use: Never used  ?Substance Use Topics  ? Alcohol use: Yes  ?  Comment: occasionally  ? Drug use: No  ? ?ROS ?See pertinent in HPI. All other systems reviewed and non contributory ?Blood pressure 128/84, height 5\' 9"  (1.753 m), weight 295 lb (133.8 kg), last menstrual period 03/02/2021. ? ?GENERAL: Well-developed, well-nourished female in no acute distress.  ?ABDOMEN: Soft, nontender, nondistended. No organomegaly. ?PELVIC: Normal external female genitalia. Vagina is pink and rugated.  Normal discharge. Normal appearing cervix. Uterus is normal in size. No adnexal mass or tenderness. No descensus on exam. Chaperone present during the pelvic exam ?EXTREMITIES: No cyanosis, clubbing, or edema, 2+ distal pulses. ? ?2021 ultrasound ?Findings:  ?The uterus is  anteverted and measures 7.1 x 4.4 x 3.5 cm. ?Echo texture is homogenous without evidence of focal masses. ?The Endometrium measures 7.3 mm. ?  ?Right Ovary measures 3.5 x 3.2 x 1.8 cm. It is normal in appearance. ?Left Ovary measures 3.6 x 3.0 x 2.9 cm. It is normal in appearance. ?Survey of the adnexa demonstrates no adnexal masses. ?There is no free fluid in the cul de sac. ?  ?Impression: ?1. Normal pelvic ultrasound.  ?  ?Recommendations: ?1.Clinical correlation with the patient's History and Physical Exam. ?  ?Gweneth Dimitri, RT  ?  ?Review of ULTRASOUND. ?   I have personally reviewed images and report of recent ultrasound done at [facility]. ?   Plan of management to be discussed with pa Copland ?  ?Barnett Applebaum, MD, FACOG ?[place], [place] ?03/17/2019  5:21 PM ? ?A/P 46 yo with menorrhagia with regular cycles ?- Patient with normal pap smear on 08/2020 ?- Patient is current on mammogram ?- Discussed medical management with hormonal options vs surgical management with endometrial ablation. Patient is only interested in definitive management with hysterectomy. ?Given poor descensus on exam, patient is not an adequate candidate for a vaginal hysterectomy. Patient has been referred to [facility] for surgical consultation. ?Patient desires to defer work up with new surgeon ?

## 2021-03-16 ENCOUNTER — Encounter: Payer: Self-pay | Admitting: Nurse Practitioner

## 2021-03-17 ENCOUNTER — Ambulatory Visit (INDEPENDENT_AMBULATORY_CARE_PROVIDER_SITE_OTHER): Payer: BC Managed Care – PPO | Admitting: Nurse Practitioner

## 2021-03-17 ENCOUNTER — Other Ambulatory Visit: Payer: Self-pay

## 2021-03-17 ENCOUNTER — Encounter: Payer: Self-pay | Admitting: Nurse Practitioner

## 2021-03-17 VITALS — Ht 69.0 in | Wt 295.0 lb

## 2021-03-17 DIAGNOSIS — J301 Allergic rhinitis due to pollen: Secondary | ICD-10-CM | POA: Diagnosis not present

## 2021-03-17 DIAGNOSIS — B379 Candidiasis, unspecified: Secondary | ICD-10-CM | POA: Diagnosis not present

## 2021-03-17 DIAGNOSIS — E538 Deficiency of other specified B group vitamins: Secondary | ICD-10-CM

## 2021-03-17 DIAGNOSIS — K219 Gastro-esophageal reflux disease without esophagitis: Secondary | ICD-10-CM

## 2021-03-17 DIAGNOSIS — T3695XA Adverse effect of unspecified systemic antibiotic, initial encounter: Secondary | ICD-10-CM

## 2021-03-17 DIAGNOSIS — Z6841 Body Mass Index (BMI) 40.0 and over, adult: Secondary | ICD-10-CM

## 2021-03-17 DIAGNOSIS — J0140 Acute pansinusitis, unspecified: Secondary | ICD-10-CM | POA: Diagnosis not present

## 2021-03-17 DIAGNOSIS — F321 Major depressive disorder, single episode, moderate: Secondary | ICD-10-CM

## 2021-03-17 DIAGNOSIS — F411 Generalized anxiety disorder: Secondary | ICD-10-CM

## 2021-03-17 DIAGNOSIS — B009 Herpesviral infection, unspecified: Secondary | ICD-10-CM

## 2021-03-17 MED ORDER — ALPRAZOLAM 1 MG PO TABS: ORAL_TABLET | ORAL | 2 refills | Status: DC

## 2021-03-17 MED ORDER — ESCITALOPRAM OXALATE 20 MG PO TABS: ORAL_TABLET | ORAL | 1 refills | Status: DC

## 2021-03-17 MED ORDER — OMEPRAZOLE 20 MG PO CPDR: 20.0000 mg | DELAYED_RELEASE_CAPSULE | Freq: Two times a day (BID) | ORAL | 1 refills | Status: DC

## 2021-03-17 MED ORDER — CYANOCOBALAMIN 1000 MCG/ML IJ SOLN: INTRAMUSCULAR | 1 refills | Status: DC

## 2021-03-17 MED ORDER — MONTELUKAST SODIUM 10 MG PO TABS: 10.0000 mg | ORAL_TABLET | Freq: Every day | ORAL | 1 refills | Status: DC

## 2021-03-17 MED ORDER — VALACYCLOVIR HCL 1 G PO TABS: 1000.0000 mg | ORAL_TABLET | Freq: Every day | ORAL | 5 refills | Status: DC

## 2021-03-17 MED ORDER — FLUCONAZOLE 150 MG PO TABS: ORAL_TABLET | ORAL | 0 refills | Status: DC

## 2021-03-17 MED ORDER — AZITHROMYCIN 250 MG PO TABS: ORAL_TABLET | ORAL | 0 refills | Status: DC

## 2021-03-17 NOTE — Progress Notes (Unsigned)
Virtual Visit via Telephone Note  I connected with Erin Mcdonald on 03/17/21 at  2:30 PM EST by telephone and verified that I am speaking with the correct person using two identifiers.  Location: Patient: home Provider: [place] [facility] at [facility]     I discussed the limitations, risks, security and privacy concerns of performing an evaluation and management service by telephone and the availability of in person appointments. I also discussed with the patient that there may be a patient responsible charge related to this service. The patient expressed understanding and agreed to proceed.   History of Present Illness:    Observations/Objective:   Assessment and Plan:   Follow Up Instructions:    I discussed the assessment and treatment plan with the patient. The patient was provided an opportunity to ask questions and all were answered. The patient agreed with the plan and demonstrated an understanding of the instructions.   The patient was advised to call back or seek an in-person evaluation if the symptoms worsen or if the condition fails to improve as anticipated.  I provided *** minutes of non-face-to-face time during this encounter.   [name], NP

## 2021-03-18 ENCOUNTER — Telehealth: Payer: Self-pay | Admitting: Nurse Practitioner

## 2021-03-18 ENCOUNTER — Other Ambulatory Visit: Payer: Self-pay | Admitting: Nurse Practitioner

## 2021-03-18 DIAGNOSIS — R42 Dizziness and giddiness: Secondary | ICD-10-CM

## 2021-03-18 MED ORDER — MECLIZINE HCL 25 MG PO TABS: 25.0000 mg | ORAL_TABLET | Freq: Three times a day (TID) | ORAL | 0 refills | Status: AC | PRN

## 2021-03-18 NOTE — Telephone Encounter (Signed)
Added medlixine '25mg'$  up to three times daily as needed for vertigo - sent to [facility].

## 2021-03-18 NOTE — Telephone Encounter (Signed)
Patient is aware 

## 2021-03-18 NOTE — Telephone Encounter (Signed)
Patient is requesting medication for vertigo. Please advise. [phone] ?

## 2021-03-18 NOTE — Progress Notes (Signed)
Added medlixine '25mg'$  up to three times daily as needed for vertigo - sent to [facility].  ?

## 2021-03-21 ENCOUNTER — Encounter: Payer: Self-pay | Admitting: Nurse Practitioner

## 2021-03-21 ENCOUNTER — Other Ambulatory Visit: Payer: Self-pay | Admitting: Nurse Practitioner

## 2021-03-21 DIAGNOSIS — J0140 Acute pansinusitis, unspecified: Secondary | ICD-10-CM

## 2021-03-21 MED ORDER — CEFUROXIME AXETIL 500 MG PO TABS: 500.0000 mg | ORAL_TABLET | Freq: Two times a day (BID) | ORAL | 0 refills | Status: DC

## 2021-03-21 NOTE — Progress Notes (Signed)
Changed antibiotic therapy to ceftin 500 mg twice daily for 10 days.  ?

## 2021-03-25 ENCOUNTER — Ambulatory Visit: Payer: BC Managed Care – PPO | Admitting: Obstetrics and Gynecology

## 2021-03-25 ENCOUNTER — Other Ambulatory Visit: Payer: Self-pay

## 2021-03-25 ENCOUNTER — Encounter: Payer: Self-pay | Admitting: Obstetrics and Gynecology

## 2021-03-25 VITALS — BP 113/81 | HR 97 | Ht 69.5 in | Wt 293.2 lb

## 2021-03-25 DIAGNOSIS — F3281 Premenstrual dysphoric disorder: Secondary | ICD-10-CM

## 2021-03-25 DIAGNOSIS — Z6841 Body Mass Index (BMI) 40.0 and over, adult: Secondary | ICD-10-CM

## 2021-03-25 DIAGNOSIS — R7303 Prediabetes: Secondary | ICD-10-CM

## 2021-03-25 DIAGNOSIS — Z01818 Encounter for other preprocedural examination: Secondary | ICD-10-CM

## 2021-03-25 DIAGNOSIS — N920 Excessive and frequent menstruation with regular cycle: Secondary | ICD-10-CM | POA: Diagnosis not present

## 2021-03-25 DIAGNOSIS — Z7689 Persons encountering health services in other specified circumstances: Secondary | ICD-10-CM | POA: Diagnosis not present

## 2021-03-25 MED ORDER — NORETHINDRONE ACETATE 5 MG PO TABS: 5.0000 mg | ORAL_TABLET | Freq: Every day | ORAL | 0 refills | Status: DC

## 2021-03-25 NOTE — Patient Instructions (Addendum)
? ? ?GYNECOLOGY PRE-OPERATIVE INSTRUCTIONS ? ?You are scheduled for surgery on 04/25/2021.  The name of your procedure is: Robotic-assisted laparoscopic hysterectomy with bilateral salpingectomy (removal of uterus and fallopian tubes).  ? ?Please read through these instructions carefully regarding preparation for your surgery: Nothing to eat after midnight on the day prior to surgery.  ?Do not take any medications unless recommended by your provider on day prior to surgery.  ?Do not take NSAIDs (Motrin, Aleve) or aspirin 7 days prior to surgery.  You may take Tylenol products for minor aches and pains.  ?You will receive a prescription for pain medications post-operatively.  ?You will be contacted by phone approximately 1-2 weeks prior to surgery to schedule your pre-operative appointment.  ?If you are being admitted to the hospital for an overnight stay, you will require COVID testing prior to surgery. Instructions will be given to you on when and where to have this performed.  ?Please call the office if you have any questions regarding your upcoming surgery.  ? ? ?Thank you for choosing [facility]. ? ? ?Robotic Assisted Hysterectomy ? ?This surgical method provides a high-powered 3-D view of the operating area, permits an extensive range of motion that is more precise the human hand and allows use of surgical instruments from angles and positions that would be difficult to otherwise achieve. ? ?OVERVIEW ?What is robotic assisted hysterectomy? ?Robotic assisted hysterectomy is a type of surgery that uses surgeon-controlled robotic equipment to remove your uterus. ? ?Hysterectomy is the surgical removal of the uterus. The uterus is a hollow, muscular organ located in a woman?s pelvis. Having a hysterectomy ends menstruation and the ability to become pregnant. Depending on the reason for the surgery, a hysterectomy may also involve the removal of other organs and tissues, such as the ovaries and/or  fallopian tubes. ? ?A robotic assisted surgery system consists of two separate pieces of equipment. One robotic piece of equipment is located next to the patient in the operating room. This robotic piece has four arms, which are long thin tubes that are attached to either a thin surgical instrument or a tiny camera. The surgical instruments and camera enter the patient?s body through small ? inch cuts (incisions) in the abdomen. ? ?A short distance away from the operating table, the surgeon is seated in front of a separate computerized piece of equipment that looks like a video game. The surgeon controls the movements of the robotic arms and instruments with hand-held controls. The surgeon looks through binocular-like lenses on the equipment and a computer generates a three-dimensional view of the operating area. Foot pedals control the camera and allow the surgeon to zoom in or out to change the surgical view. ? ?In what ways does robotic assisted surgery help the surgeon? ?The robotic assisted surgery is a computer-enhanced surgical system that gives surgeons the advantages of: ? ?A 3-D view of the surgical field, including depth, up to 15 times the magnification and high resolution ?Instruments that mimic the movement of the human hands, wrists and fingers, allowing an extensive range of motion that is more precise than the surgeon?s natural hand and wrist movements ?A constant steadiness of the robot arms and instruments and robot wrists that make it easier for surgeons to operate on organs and tissues for long periods of time and from angles and positions they would have difficulty reaching with human hands and fingers ?The surgeon controls every precise movement of the robotic arms and instruments. The robotic arms  cannot move on their own. ? ?Who may benefit from robotic-assisted hysterectomy? ?Robotic assisted hysterectomy may be especially helpful in: ? ?Patients who are obese ?Patients who have endometrial  cancer ?Patients who have complex surgical cases, such as advanced stage endometriosis or pelvic adhesive disease (scar tissue that binds nearby organs together) ?You and your surgeon will discuss if robotic assisted surgery is possible and appropriate for your specific condition. ? ?PROCEDURE DETAILS ?To perform robotic-assisted surgery, a surgeon is seated in front of a computerized console that provides a high-powered view of the operating area. Movement of robotic arms attached to surgical instruments is controlled by the surgeon seated at the console a few feet away. ?Typical operating room setup for robotic assisted surgery. The surgeon is seated seated in front of a computerized console that provides a high-powered, 3-D view of the operating area. The surgical instruments, attached to robotic arms, are controlled by the surgeon seated at the console. ?What happens before and during robotic assisted hysterectomy? ?Before the procedure ? ?Before your surgery, your doctor will perform a physical exam, order blood and urine tests and may order other tests to check your general health. Your doctor will tell you which of your current medications can continue to be taken and which will need to be temporarily stopped before surgery. You will be given instructions on when to stop eating and drinking the evening before and morning of your surgery. Your surgeon will explain the procedure in detail, including possible complications and side effects. He or she will also answer your questions. ? ?On the day of surgery: ? ?A urinary catheter may be inserted to empty your bladder ?Your abdominal area will be cleaned with a sterile solution ?An intravenous (IV) line will be placed in a vein in your arm to deliver medications and fluids ?During the procedure ? ?After receiving anesthesia, your surgeon will make four or five small surgical cuts (incisions) in your abdomen (belly). The thin surgical instruments and tiny lighted  camera attached to the arms of the surgical robot are inserted into the abdomen through these incisions. ? ?The surgeon controls the precise movement of the robotic arms, surgical instruments and camera while seated at a computer console. Members of the surgical team stand next to the operating table to change the robotic instruments and provide other assistance to the surgeon as needed. ? ?Your surgeon typically removes the uterus through the vagina, like when delivering a baby. In certain cases, the uterus is removed through the small incisions in your abdomen. ? ?An anesthesiologist monitors your anesthesia and vital signs throughout your operation. ? ?How long does robotic assisted hysterectomy take to complete? ?Robotic assisted hysterectomy typically takes between one to four hours to complete, depending upon the surgeon and the complexity of the case. ? ?What?s the typical recovery time with robotic assisted hysterectomy? ?Robotic hysterectomy is an outpatient procedure. You may stay in the hospital overnight but some woman can be released the same day of surgery. You will able return to light regular activities the next day (walking, eating, walking up stairs). You can drive in about a week or less at the discretion of your physician and return to exercising in about four to six weeks. Your doctor will review your progress and tell you when you can return to your normal activities. ? ?RISKS / BENEFITS ?What are the advantages of robot-assisted surgery for patients compared with traditional open surgery? ?Compared with traditional open surgery, the benefits of robotic assisted surgery may  include: ? ?Less blood loss during surgery ?Smaller incisions with less scarring. Surgery is performed through small incisions instead of the large incision of open surgery ?Less post-op pain ?Decreased risk of infection ?Shorter hospital stay ?Shorter recovery time and quicker return to previous activities. You can usually  resume normal activities as soon as you feel up to it. ?What are the risks of robotic hysterectomy? ?Robot-assisted hysterectomy takes more time compared to other hysterectomy methods, such as traditional

## 2021-03-25 NOTE — Progress Notes (Signed)
? ? ?GYNECOLOGY CLINIC PROGRESS NOTE  ? ?Subjective:  ?  ? Erin Mcdonald is an 46 y.o.  G0P0 woman who presents for establishing care and preoperative evaluation for definitive management of hysterectomy. She has a history of menorrhagia with regular cycles, PMDD, and dysmenorrhea. Has trialed Lysteda with initial improvement, however they have now become heavy again. Has a history of anemia requiring iron supplementation. Often times feels drained and tired for at least 2-3 weeks out of the month.  ? ?Menstrual History: ?Patient's last menstrual period was 03/02/2021 (exact date). ?Period Duration (Days): 7 ?Period Pattern: Regular ?Menstrual Flow: Heavy ?Menstrual Control: Maxi pad (menstraul underwear) ?Menstrual Control Change Freq (Hours): 2.5 ?Dysmenorrhea: (!) Moderate ?Dysmenorrhea Symptoms: Cramping, Throbbing, Headache, Diarrhea ? ? ?GYN History:  ?Last pap smear: 08/31/2020. Results were: NILM, HR HPV+ (neg 16/18/45) ?Contraception: None.  ? ? ? ?Past Medical History:  ?Diagnosis Date  ? Anxiety   ? Depression   ? Genital herpes   ? Sleep apnea   ? ? ?Past Surgical History:  ?Procedure Laterality Date  ? LAPAROSCOPIC GASTRIC SLEEVE RESECTION  11/15/2012  ? [facility], Dr Leda Gauze  ? TONSILLECTOMY  2013  ? ? ?Family History  ?Problem Relation Age of Onset  ? Heart attack Mother   ? Diabetes Paternal Grandmother   ? Diabetes Paternal Grandfather   ? Colon cancer Father 53  ? Breast cancer Neg Hx   ? Ovarian cancer Neg Hx   ? ? ?Social History  ? ?Socioeconomic History  ? Marital status: Single  ?  Spouse name: Not on file  ? Number of children: Not on file  ? Years of education: Not on file  ? Highest education level: Not on file  ?Occupational History  ? Not on file  ?Tobacco Use  ? Smoking status: Every Day  ?  Packs/day: 1.00  ?  Types: Cigarettes  ? Smokeless tobacco: Never  ? Tobacco comments:  ?  0.5-1ppd 08/25/2020  ?Vaping Use  ? Vaping Use: Never used  ?Substance and Sexual Activity  ? Alcohol use: Not  Currently  ?  Comment: occasionally  ? Drug use: No  ? Sexual activity: Not Currently  ?  Birth control/protection: None  ?Other Topics Concern  ? Not on file  ?Social History Narrative  ? Not on file  ? ?Social Determinants of Health  ? ?Financial Resource Strain: Not on file  ?Food Insecurity: Not on file  ?Transportation Needs: Not on file  ?Physical Activity: Not on file  ?Stress: Not on file  ?Social Connections: Not on file  ?Intimate Partner Violence: Not on file  ? ? ?Current Outpatient Medications on File Prior to Visit  ?Medication Sig Dispense Refill  ? ALPRAZolam (XANAX) 1 MG tablet Take 1 tablet po TID prn anxiety 90 tablet 2  ? Cholecalciferol (VITAMIN D3 PO) Take by mouth.    ? cyanocobalamin (,VITAMIN B-12,) 1000 MCG/ML injection Inject 81m IM once weekly for 6 weeks then inject 131mIM monthly 10 mL 1  ? escitalopram (LEXAPRO) 20 MG tablet Take 1 tablet po QD 90 tablet 1  ? Ferrous Sulfate (IRON PO) Take by mouth.    ? Insulin Pen Needle (PEN NEEDLES) 32G X 5 MM MISC To use daily with dosage of saxenda 100 each 3  ? meclizine (ANTIVERT) 25 MG tablet Take 1 tablet (25 mg total) by mouth 3 (three) times daily as needed for dizziness. 30 tablet 0  ? montelukast (SINGULAIR) 10 MG tablet Take 1  tablet (10 mg total) by mouth at bedtime. 90 tablet 1  ? omeprazole (PRILOSEC) 20 MG capsule Take 1 capsule (20 mg total) by mouth 2 (two) times daily. 180 capsule 1  ? Syringe/Needle, Disp, (SYRINGE 3CC/25GX1") 25G X 1" 3 ML MISC Use for IM b12 injections. 4 each 5  ? valACYclovir (VALTREX) 1000 MG tablet Take 1 tablet (1,000 mg total) by mouth daily. 30 tablet 5  ? ?No current facility-administered medications on file prior to visit.  ? ? ?Allergies  ?Allergen Reactions  ? Codeine Other (See Comments)  ?  Cause pt to be hyper  ? ? ? ?Review of Systems ?Pertinent items noted in HPI and remainder of comprehensive ROS otherwise negative.  ?  ?Objective:  ? ? BP 113/81   Pulse 97   Ht 5' 9.5" (1.765 m)   Wt 293 lb  3.2 oz (133 kg)   LMP 03/02/2021 (Exact Date)   BMI 42.68 kg/m?  ? ?General appearance: alert and no distress ?Lungs: clear to auscultation bilaterally ?Heart: regular rate and rhythm, S1, S2 normal, no murmur, click, rub or gallop ?Abdomen: soft, non-tender; bowel sounds normal; no masses,  no organomegaly ?Pelvic: deferred. See previous exam performed by Dr. Darrick Grinder on 03/09/2021, noting minimal uterine descensus.  ?Extremities: extremities normal, atraumatic, no cyanosis or edema ?Neurologic: Grossly intact ? ? ?Labs:  ?Lab Results  ?Component Value Date  ? WBC 9.9 08/31/2020  ? HGB 14.3 08/31/2020  ? HCT 42.7 08/31/2020  ? MCV 95 08/31/2020  ? PLT 367 08/31/2020  ? ? ?Lab Results  ?Component Value Date  ? TSH 2.380 08/31/2020  ? ? ?Lab Results  ?Component Value Date  ? HGBA1C 5.8 (H) 08/31/2020  ? ? ? ?Imaging:  ?2021 ultrasound ?Findings:  ?The uterus is anteverted and measures 7.1 x 4.4 x 3.5 cm. ?Echo texture is homogenous without evidence of focal masses. ?The Endometrium measures 7.3 mm. ?  ?Right Ovary measures 3.5 x 3.2 x 1.8 cm. It is normal in appearance. ?Left Ovary measures 3.6 x 3.0 x 2.9 cm. It is normal in appearance. ?Survey of the adnexa demonstrates no adnexal masses. ?There is no free fluid in the cul de sac. ?  ?Impression: ?1. Normal pelvic ultrasound.  ?  ?Recommendations: ?1.Clinical correlation with the patient's History and Physical Exam. ? ?Assessment:  ? ?1. Encounter to establish care   ?2. Menorrhagia with regular cycle   ?3. PMDD (premenstrual dysphoric disorder)   ?4. Pre-diabetes   ?5. Body mass index (BMI) of 40.1-44.9 in adult [facility])   ? ?Plan:  ? ?- Patient desires definitive management with hysterectomy. She has previously been counseled on all other management alternatives, declines all other modalities. I proposed doing a robotic-assisted laparoscopic hysterectomy and prophylactic bilateral salpingectomy.  No indication for oophorectomy.  Robotic hysterectomy proposed  due to patient's obesity and prior abdominal surgery. Patient agrees with this proposed surgery.  The risks of surgery were discussed in detail with the patient including but not limited to: bleeding which may require transfusion or reoperation; infection which may require antibiotics; injury to bowel, bladder, ureters or other surrounding organs; need for additional procedures including laparotomy or subsequent procedures secondary to abnormal pathology; formation of adhesions; thromboembolic phenomenon; incisional problems and other postoperative/anesthesia complications.  Patient was also advised that she will go home post-operatively, however may remain under observation overnight if deemed necessary; and expected recovery time after a hysterectomy is 6-8 weeks.  Patient was told that the likelihood that her  condition and symptoms will be treated effectively with this surgical management was very high; the postoperative expectations were also discussed in detail. The patient also understands the alternative treatment options which were discussed in full. All questions were answered.  She was told that she will be contacted by our surgical scheduler regarding the time and date of her surgery; routine preoperative instructions will be given to her by the preoperative nursing team.   Routine postoperative instructions will be reviewed with the patient in detail after surgery.  In the meantime, she will be prescribed Aygestin for management of her bleeding; bleeding precautions were reviewed. Printed patient education handouts about the procedure was given to the patient to review at home. Surgery scheduled for 4/17/2/023 ?- Prediabetes, will recheck levels to ensure patient will be optimized for wound healing.  ?- Follow up ultrasound ordered as last ultrasound performed in 2021.  ? ? ? ?Rubie Maid, MD ?[facility] ? ?

## 2021-03-25 NOTE — H&P (Signed)
? ? ?GYNECOLOGY PREOPERATIVE HISTORY AND PHYSICAL  ? ?Subjective:  ?Erin Mcdonald is a 46 y.o. G0P0000 here for surgical management of menorrhagia, dysmenorrhea, iron deficiency anemia.   Also with a h/o morbid obesity. She has a history of menorrhagia with regular cycles, PMDD, and dysmenorrhea. Has trialed Lysteda with initial improvement, however they have now become heavy again. Has a history of anemia requiring iron supplementation. Often times feels drained and tired for at least 2-3 weeks out of the month.  ? ? ?Proposed surgery: Robotic Total Laparoscopic Hysterectomy with Bilateral Salpingectomy ? ? ? ?Menstrual History: ?Patient's last menstrual period was 03/02/2021 (exact date). ?Period Duration (Days): 7 ?Period Pattern: Regular ?Menstrual Flow: Heavy ?Menstrual Control: Maxi pad (menstraul underwear) ?Menstrual Control Change Freq (Hours): 2.5 ?Dysmenorrhea: (!) Moderate ?Dysmenorrhea Symptoms: Cramping, Throbbing, Headache, Diarrhea ?  ?  ?GYN History:  ?Last pap smear: 08/31/2020. Results were: NILM, HR HPV+ (neg 16/18/45) ?Contraception: None.  ?Last mammogram: 02/10/2020.  Results were normal.  ? ? ?Past Medical History:  ?Diagnosis Date  ? Anxiety   ? Depression   ? Genital herpes   ? Sleep apnea   ? ? ?Past Surgical History:  ?Procedure Laterality Date  ? LAPAROSCOPIC GASTRIC SLEEVE RESECTION  11/15/2012  ? [facility], Dr Leda Gauze  ? TONSILLECTOMY  2013  ? ? ?OB History  ?Gravida Para Term Preterm AB Living  ?0 0 0 0 0 0  ?SAB IAB Ectopic Multiple Live Births  ?0 0 0 0 0  ? ? ?Family History  ?Problem Relation Age of Onset  ? Heart attack Mother   ? Diabetes Paternal Grandmother   ? Diabetes Paternal Grandfather   ? Colon cancer Father 38  ? Breast cancer Neg Hx   ? Ovarian cancer Neg Hx   ? ? ?Social History  ? ?Socioeconomic History  ? Marital status: Single  ?  Spouse name: Not on file  ? Number of children: Not on file  ? Years of education: Not on file  ? Highest education level: Not on file   ?Occupational History  ? Not on file  ?Tobacco Use  ? Smoking status: Every Day  ?  Packs/day: 1.00  ?  Types: Cigarettes  ? Smokeless tobacco: Never  ? Tobacco comments:  ?  0.5-1ppd 08/25/2020  ?Vaping Use  ? Vaping Use: Never used  ?Substance and Sexual Activity  ? Alcohol use: Not Currently  ?  Comment: occasionally  ? Drug use: No  ? Sexual activity: Not Currently  ?  Birth control/protection: None  ?Other Topics Concern  ? Not on file  ?Social History Narrative  ? Not on file  ? ?Social Determinants of Health  ? ?Financial Resource Strain: Not on file  ?Food Insecurity: Not on file  ?Transportation Needs: Not on file  ?Physical Activity: Not on file  ?Stress: Not on file  ?Social Connections: Not on file  ?Intimate Partner Violence: Not on file  ? ? ?Current Outpatient Medications on File Prior to Visit  ?Medication Sig Dispense Refill  ? ALPRAZolam (XANAX) 1 MG tablet Take 1 tablet po TID prn anxiety 90 tablet 2  ? Cholecalciferol (VITAMIN D3 PO) Take by mouth.    ? cyanocobalamin (,VITAMIN B-12,) 1000 MCG/ML injection Inject 7m IM once weekly for 6 weeks then inject 129mIM monthly 10 mL 1  ? escitalopram (LEXAPRO) 20 MG tablet Take 1 tablet po QD 90 tablet 1  ? Ferrous Sulfate (IRON PO) Take by mouth.    ?  Insulin Pen Needle (PEN NEEDLES) 32G X 5 MM MISC To use daily with dosage of saxenda 100 each 3  ? meclizine (ANTIVERT) 25 MG tablet Take 1 tablet (25 mg total) by mouth 3 (three) times daily as needed for dizziness. 30 tablet 0  ? montelukast (SINGULAIR) 10 MG tablet Take 1 tablet (10 mg total) by mouth at bedtime. 90 tablet 1  ? omeprazole (PRILOSEC) 20 MG capsule Take 1 capsule (20 mg total) by mouth 2 (two) times daily. 180 capsule 1  ? Syringe/Needle, Disp, (SYRINGE 3CC/25GX1") 25G X 1" 3 ML MISC Use for IM b12 injections. 4 each 5  ? valACYclovir (VALTREX) 1000 MG tablet Take 1 tablet (1,000 mg total) by mouth daily. 30 tablet 5  ? ?No current facility-administered medications on file prior to  visit.  ? ?Allergies  ?Allergen Reactions  ? Codeine Other (See Comments)  ?  Cause pt to be hyper  ? ? ? ? ?Review of Systems ?Constitutional: No recent fever/chills/sweats ?Respiratory: No recent cough/bronchitis ?Cardiovascular: No chest pain ?Gastrointestinal: No recent nausea/vomiting/diarrhea ?Genitourinary: No UTI symptoms ?Hematologic/lymphatic:No history of coagulopathy or recent blood thinner use  ?  ?Objective:  ? ?Blood pressure 113/81, pulse 97, height 5' 9.5" (1.765 m), weight 293 lb 3.2 oz (133 kg), last menstrual period 03/02/2021.  Body mass index is 42.68 kg/m?. ? ?CONSTITUTIONAL: Well-developed, well-nourished female in no acute distress.  ?HENT:  Normocephalic, atraumatic, External right and left ear normal. Oropharynx is clear and moist ?EYES: Conjunctivae and EOM are normal. Pupils are equal, round, and reactive to light. No scleral icterus.  ?NECK: Normal range of motion, supple, no masses ?SKIN: Skin is warm and dry. No rash noted. Not diaphoretic. No erythema. No pallor. ?NEUROLOGIC: Alert and oriented to person, place, and time. Normal reflexes, muscle tone coordination. No cranial nerve deficit noted. ?PSYCHIATRIC: Normal mood and affect. Normal behavior. Normal judgment and thought content. ?CARDIOVASCULAR: Normal heart rate noted, regular rhythm ?RESPIRATORY: Effort and breath sounds normal, no problems with respiration noted ?ABDOMEN: Soft, nontender, nondistended. ?PELVIC: Deferred ?MUSCULOSKELETAL: Normal range of motion. No edema and no tenderness. 2+ distal pulses. ? ? ? ?Labs: ?Lab Results  ?Component Value Date  ? WBC 9.9 08/31/2020  ? HGB 14.3 08/31/2020  ? HCT 42.7 08/31/2020  ? MCV 95 08/31/2020  ? PLT 367 08/31/2020  ? ? ?Lab Results  ?Component Value Date  ? TSH 2.380 08/31/2020  ? ? ?Lab Results  ?Component Value Date  ? HGBA1C 5.8 (H) 08/31/2020  ? ? ? ?Imaging Studies: ?2021 ultrasound ?Findings:  ?The uterus is anteverted and measures 7.1 x 4.4 x 3.5 cm. ?Echo texture is  homogenous without evidence of focal masses. ?The Endometrium measures 7.3 mm. ?  ?Right Ovary measures 3.5 x 3.2 x 1.8 cm. It is normal in appearance. ?Left Ovary measures 3.6 x 3.0 x 2.9 cm. It is normal in appearance. ?Survey of the adnexa demonstrates no adnexal masses. ?There is no free fluid in the cul de sac. ?  ?Impression: ?1. Normal pelvic ultrasound.  ?  ?Recommendations: ?1.Clinical correlation with the patient's History and Physical Exam. ? ?Assessment:  ?  ?1. Encounter to establish care   ?2. Preoperative exam for gynecologic surgery   ?3. Menorrhagia with regular cycle   ?4. PMDD (premenstrual dysphoric disorder)   ?5. Pre-diabetes   ?6. Body mass index (BMI) of 40.1-44.9 in adult [facility])   ? ?  ?Plan:  ? ?- Counseling: Procedure, risks, reasons, benefits and complications (including injury to  bowel, bladder, major blood vessel, ureter, bleeding, possibility of transfusion, infection, or fistula formation) reviewed in detail. Likelihood of success in alleviating the patient's condition was discussed. Routine postoperative instructions will be reviewed with the patient and her family in detail after surgery.  The patient concurred with the proposed plan, giving informed written consent for the surgery.   ?- Preop testing ordered. ?- Instructions reviewed, including NPO after midnight. ? ? ?Rubie Maid, MD ?[facility] ? ? ?

## 2021-03-26 DIAGNOSIS — B379 Candidiasis, unspecified: Secondary | ICD-10-CM | POA: Insufficient documentation

## 2021-03-28 ENCOUNTER — Other Ambulatory Visit: Payer: Self-pay | Admitting: Nurse Practitioner

## 2021-03-28 DIAGNOSIS — Z1231 Encounter for screening mammogram for malignant neoplasm of breast: Secondary | ICD-10-CM

## 2021-04-05 ENCOUNTER — Other Ambulatory Visit: Payer: BC Managed Care – PPO

## 2021-04-13 ENCOUNTER — Other Ambulatory Visit (INDEPENDENT_AMBULATORY_CARE_PROVIDER_SITE_OTHER): Payer: BC Managed Care – PPO

## 2021-04-13 ENCOUNTER — Other Ambulatory Visit: Payer: Self-pay | Admitting: Obstetrics and Gynecology

## 2021-04-13 DIAGNOSIS — N920 Excessive and frequent menstruation with regular cycle: Secondary | ICD-10-CM

## 2021-04-18 ENCOUNTER — Encounter: Payer: Self-pay | Admitting: Obstetrics and Gynecology

## 2021-04-18 ENCOUNTER — Other Ambulatory Visit: Payer: Self-pay

## 2021-04-18 ENCOUNTER — Encounter
Admission: RE | Admit: 2021-04-18 | Discharge: 2021-04-18 | Disposition: A | Discharge status: Home / Self Care | Payer: BC Managed Care – PPO | Source: Ambulatory Visit | Attending: Obstetrics and Gynecology | Admitting: Obstetrics and Gynecology

## 2021-04-18 NOTE — Patient Instructions (Addendum)
Your procedure is scheduled on: Monday 04/25/21 ?Report to the Registration Desk on the 1st floor of the [facility]. ?To find out your arrival time, please call [phone] between 1PM - 3PM on: Friday 04/22/21 ? ?REMEMBER: ?Instructions that are not followed completely may result in serious medical risk, up to and including death; or upon the discretion of your surgeon and anesthesiologist your surgery may need to be rescheduled. ? ?Do not eat food after midnight the night before surgery.  ?No gum chewing, lozengers or hard candies. ? ?Your doctor has ordered for you to drink the provided  ?Ensure Pre-surgery ?Drinking this carbohydrate drink up to two hours before surgery helps to reduce insulin resistance and improve patient outcomes. Please complete drinking 2 hours prior to scheduled arrival time. ? ?TAKE THESE MEDICATIONS THE MORNING OF SURGERY WITH A SIP OF WATER: ?ALPRAZolam (XANAX) 1 MG tablet if needed ?escitalopram (LEXAPRO) 20 MG tablet ? ? ?omeprazole (PRILOSEC) 20 MG capsule (take one the night before and one on the morning of surgery - helps to prevent nausea after surgery.) ? ?One week prior to surgery: ?Stop Anti-inflammatories (NSAIDS) such as Advil, Aleve, Ibuprofen, Motrin, Naproxen, Naprosyn and Aspirin based products such as Excedrin, Goodys Powder, BC Powder. ? ?Stop taking your Cholecalciferol (VITAMIN D3 PO) ANY OVER THE COUNTER supplements until after surgery. ? ?You may however, continue to take Tylenol if needed for pain up until the day of surgery. ? ?No Alcohol for 24 hours before or after surgery. ? ?No Smoking including e-cigarettes for 24 hours prior to surgery.  ?No chewable tobacco products for at least 6 hours prior to surgery.  ?No nicotine patches on the day of surgery. ? ?Do not use any "recreational" drugs for at least a week prior to your surgery.  ?Please be advised that the combination of cocaine and anesthesia may have negative outcomes, up to and including death. ?If  you test positive for cocaine, your surgery will be cancelled. ? ?On the morning of surgery brush your teeth with toothpaste and water, you may rinse your mouth with mouthwash if you wish. ?Do not swallow any toothpaste or mouthwash. ? ?Use CHG Soap as directed on instruction sheet. ? ?Do not wear jewelry, make-up, hairpins, clips or nail polish. ? ?Do not wear lotions, powders, or perfumes.  ? ?Do not shave body from the neck down 48 hours prior to surgery just in case you cut yourself which could leave a site for infection.  ?Also, freshly shaved skin may become irritated if using the CHG soap. ? ?Do not bring valuables to the hospital. [facility] is not responsible for any missing/lost belongings or valuables.  ? ?Notify your doctor if there is any change in your medical condition (cold, fever, infection). ? ?Wear comfortable clothing (specific to your surgery type) to the hospital. ? ?After surgery, you can help prevent lung complications by doing breathing exercises.  ?Take deep breaths and cough every 1-2 hours. Your doctor may order a device called an Incentive Spirometer to help you take deep breaths. ?When coughing or sneezing, hold a pillow firmly against your incision with both hands. This is called ?splinting.? Doing this helps protect your incision. It also decreases belly discomfort. ? ?If you are being discharged the day of surgery, you will not be allowed to drive home. ?You will need a responsible adult (18 years or older) to drive you home and stay with you that night.  ? ?If you are taking public transportation, you will  need to have a responsible adult (18 years or older) with you. ?Please confirm with your physician that it is acceptable to use public transportation.  ? ?Please call the [facility] at [phone] if you have any questions about these instructions. ? ?Surgery Visitation Policy: ? ?Patients undergoing a surgery or procedure may have two family members or  support persons with them as long as the person is not COVID-19 positive or experiencing its symptoms.  ? ?Inpatient Visitation:   ? ?Visiting hours are 7 a.m. to 8 p.m. ?Up to four visitors are allowed at one time in a patient room, including children. The visitors may rotate out with other people during the day. One designated support person (adult) may remain overnight.  ?

## 2021-04-19 ENCOUNTER — Other Ambulatory Visit
Admission: RE | Admit: 2021-04-19 | Discharge: 2021-04-19 | Disposition: A | Discharge status: Home / Self Care | Payer: BC Managed Care – PPO | Source: Ambulatory Visit | Attending: Obstetrics and Gynecology | Admitting: Obstetrics and Gynecology

## 2021-04-19 DIAGNOSIS — Z01812 Encounter for preprocedural laboratory examination: Secondary | ICD-10-CM | POA: Insufficient documentation

## 2021-04-19 DIAGNOSIS — R7303 Prediabetes: Secondary | ICD-10-CM | POA: Diagnosis not present

## 2021-04-19 DIAGNOSIS — Z01818 Encounter for other preprocedural examination: Secondary | ICD-10-CM

## 2021-04-19 LAB — CBC
HCT: 44.9 % (ref 36.0–46.0)
Hemoglobin: 14.6 g/dL (ref 12.0–15.0)
MCH: 31.5 pg (ref 26.0–34.0)
MCHC: 32.5 g/dL (ref 30.0–36.0)
MCV: 96.8 fL (ref 80.0–100.0)
Platelets: 365 10*3/uL (ref 150–400)
RBC: 4.64 MIL/uL (ref 3.87–5.11)
RDW: 13.7 % (ref 11.5–15.5)
WBC: 12.8 10*3/uL — ABNORMAL HIGH (ref 4.0–10.5)
nRBC: 0 % (ref 0.0–0.2)

## 2021-04-19 LAB — BASIC METABOLIC PANEL
Anion gap: 8 (ref 5–15)
BUN: 12 mg/dL (ref 6–20)
CO2: 25 mmol/L (ref 22–32)
Calcium: 9.2 mg/dL (ref 8.9–10.3)
Chloride: 104 mmol/L (ref 98–111)
Creatinine, Ser: 0.43 mg/dL — ABNORMAL LOW (ref 0.44–1.00)
GFR, Estimated: >60 mL/min (ref >60)
Glucose, Bld: 88 mg/dL (ref 70–99)
Potassium: 3.7 mmol/L (ref 3.5–5.1)
Sodium: 137 mmol/L (ref 135–145)

## 2021-04-19 LAB — TYPE AND SCREEN
ABO/RH(D): A POS
Antibody Screen: NEGATIVE

## 2021-04-20 LAB — HEMOGLOBIN A1C
Hgb A1c MFr Bld: 5.4 % (ref 4.8–5.6)
Mean Plasma Glucose: 108.28 mg/dL

## 2021-04-22 ENCOUNTER — Other Ambulatory Visit: Payer: Self-pay | Admitting: Nurse Practitioner

## 2021-04-22 DIAGNOSIS — E538 Deficiency of other specified B group vitamins: Secondary | ICD-10-CM

## 2021-04-25 ENCOUNTER — Encounter: Payer: Self-pay | Admitting: Obstetrics and Gynecology

## 2021-04-25 ENCOUNTER — Other Ambulatory Visit: Payer: Self-pay

## 2021-04-25 ENCOUNTER — Encounter: Payer: Self-pay | Admitting: Nurse Practitioner

## 2021-04-25 ENCOUNTER — Encounter
Admission: RE | Disposition: A | Discharge status: Home / Self Care | Payer: Self-pay | Source: Home / Self Care | Attending: Obstetrics and Gynecology

## 2021-04-25 ENCOUNTER — Ambulatory Visit: Payer: BC Managed Care – PPO | Admitting: Certified Registered"

## 2021-04-25 ENCOUNTER — Ambulatory Visit
Admission: RE | Admit: 2021-04-25 | Discharge: 2021-04-25 | Disposition: A | Discharge status: Home / Self Care | Payer: BC Managed Care – PPO | Attending: Obstetrics and Gynecology | Admitting: Obstetrics and Gynecology

## 2021-04-25 DIAGNOSIS — D500 Iron deficiency anemia secondary to blood loss (chronic): Secondary | ICD-10-CM

## 2021-04-25 DIAGNOSIS — D649 Anemia, unspecified: Secondary | ICD-10-CM | POA: Diagnosis not present

## 2021-04-25 DIAGNOSIS — F3281 Premenstrual dysphoric disorder: Secondary | ICD-10-CM | POA: Diagnosis not present

## 2021-04-25 DIAGNOSIS — F32A Depression, unspecified: Secondary | ICD-10-CM | POA: Insufficient documentation

## 2021-04-25 DIAGNOSIS — Z01818 Encounter for other preprocedural examination: Secondary | ICD-10-CM

## 2021-04-25 DIAGNOSIS — R7303 Prediabetes: Secondary | ICD-10-CM | POA: Diagnosis not present

## 2021-04-25 DIAGNOSIS — D509 Iron deficiency anemia, unspecified: Secondary | ICD-10-CM

## 2021-04-25 DIAGNOSIS — D759 Disease of blood and blood-forming organs, unspecified: Secondary | ICD-10-CM | POA: Insufficient documentation

## 2021-04-25 DIAGNOSIS — N920 Excessive and frequent menstruation with regular cycle: Secondary | ICD-10-CM | POA: Diagnosis not present

## 2021-04-25 DIAGNOSIS — Z6841 Body Mass Index (BMI) 40.0 and over, adult: Secondary | ICD-10-CM | POA: Insufficient documentation

## 2021-04-25 DIAGNOSIS — G4730 Sleep apnea, unspecified: Secondary | ICD-10-CM | POA: Diagnosis not present

## 2021-04-25 DIAGNOSIS — F419 Anxiety disorder, unspecified: Secondary | ICD-10-CM | POA: Insufficient documentation

## 2021-04-25 DIAGNOSIS — N946 Dysmenorrhea, unspecified: Secondary | ICD-10-CM | POA: Diagnosis not present

## 2021-04-25 DIAGNOSIS — N72 Inflammatory disease of cervix uteri: Secondary | ICD-10-CM | POA: Insufficient documentation

## 2021-04-25 DIAGNOSIS — K219 Gastro-esophageal reflux disease without esophagitis: Secondary | ICD-10-CM | POA: Diagnosis not present

## 2021-04-25 DIAGNOSIS — F17210 Nicotine dependence, cigarettes, uncomplicated: Secondary | ICD-10-CM | POA: Diagnosis not present

## 2021-04-25 DIAGNOSIS — Z90710 Acquired absence of both cervix and uterus: Secondary | ICD-10-CM

## 2021-04-25 HISTORY — PX: ROBOTIC ASSISTED LAPAROSCOPIC HYSTERECTOMY AND SALPINGECTOMY: SHX6379

## 2021-04-25 LAB — POCT PREGNANCY, URINE: Preg Test, Ur: NEGATIVE

## 2021-04-25 LAB — ABO/RH: ABO/RH(D): A POS

## 2021-04-25 SURGERY — XI ROBOTIC ASSISTED LAPAROSCOPIC HYSTERECTOMY AND SALPINGECTOMY
Anesthesia: General | Site: Abdomen | Laterality: Bilateral

## 2021-04-25 MED ORDER — LACTATED RINGERS IV SOLN: INTRAVENOUS | Status: DC

## 2021-04-25 MED ORDER — FENTANYL CITRATE (PF) 100 MCG/2ML IJ SOLN
25.0000 ug | INTRAMUSCULAR | Status: DC | PRN
  Administered 2021-04-25 (×3): 50 ug via INTRAVENOUS

## 2021-04-25 MED ORDER — POVIDONE-IODINE 10 % EX SWAB
2.0000 "application " | Freq: Once | CUTANEOUS | Status: AC
  Administered 2021-04-25: 2 via TOPICAL

## 2021-04-25 MED ORDER — FENTANYL CITRATE (PF) 100 MCG/2ML IJ SOLN
INTRAMUSCULAR | Status: AC
  Filled 2021-04-25: qty 2

## 2021-04-25 MED ORDER — ONDANSETRON HCL 4 MG/2ML IJ SOLN
INTRAMUSCULAR | Status: AC
  Filled 2021-04-25: qty 2

## 2021-04-25 MED ORDER — ACETAMINOPHEN 500 MG PO TABS
ORAL_TABLET | ORAL | Status: AC
  Administered 2021-04-25: 1000 mg via ORAL
  Filled 2021-04-25: qty 2

## 2021-04-25 MED ORDER — KETOROLAC TROMETHAMINE 30 MG/ML IJ SOLN
INTRAMUSCULAR | Status: DC | PRN
  Administered 2021-04-25: 30 mg via INTRAVENOUS

## 2021-04-25 MED ORDER — DEXAMETHASONE SODIUM PHOSPHATE 10 MG/ML IJ SOLN
INTRAMUSCULAR | Status: AC
  Filled 2021-04-25: qty 1

## 2021-04-25 MED ORDER — HYDROMORPHONE HCL 1 MG/ML IJ SOLN
INTRAMUSCULAR | Status: AC
  Filled 2021-04-25: qty 1

## 2021-04-25 MED ORDER — ROCURONIUM BROMIDE 100 MG/10ML IV SOLN
INTRAVENOUS | Status: DC | PRN
Start: 2021-04-25 — End: 2021-04-25
  Administered 2021-04-25: 20 mg via INTRAVENOUS
  Administered 2021-04-25: 30 mg via INTRAVENOUS
  Administered 2021-04-25: 10 mg via INTRAVENOUS
  Administered 2021-04-25: 60 mg via INTRAVENOUS
  Administered 2021-04-25: 20 mg via INTRAVENOUS

## 2021-04-25 MED ORDER — HYDROCODONE-ACETAMINOPHEN 5-325 MG PO TABS: 1.0000 | ORAL_TABLET | Freq: Four times a day (QID) | ORAL | 0 refills | Status: DC | PRN

## 2021-04-25 MED ORDER — SUGAMMADEX SODIUM 500 MG/5ML IV SOLN
INTRAVENOUS | Status: DC | PRN
  Administered 2021-04-25: 400 mg via INTRAVENOUS

## 2021-04-25 MED ORDER — BUPIVACAINE-MELOXICAM ER 400-12 MG/14ML IJ SOLN
INTRAMUSCULAR | Status: DC | PRN
  Administered 2021-04-25: 200 mg

## 2021-04-25 MED ORDER — BUPIVACAINE HCL (PF) 0.5 % IJ SOLN
INTRAMUSCULAR | Status: AC
  Filled 2021-04-25: qty 30

## 2021-04-25 MED ORDER — METHYLENE BLUE 0.5 % INJ SOLN
INTRAVENOUS | Status: AC
Start: 2021-04-25 — End: ?
  Filled 2021-04-25: qty 10

## 2021-04-25 MED ORDER — OXYCODONE HCL 5 MG PO TABS
5.0000 mg | ORAL_TABLET | Freq: Once | ORAL | Status: AC | PRN
  Administered 2021-04-25: 5 mg via ORAL

## 2021-04-25 MED ORDER — CEFAZOLIN SODIUM 10 G IJ SOLR: INTRAMUSCULAR | Status: DC | PRN

## 2021-04-25 MED ORDER — ACETAMINOPHEN 10 MG/ML IV SOLN: 1000.0000 mg | Freq: Once | INTRAVENOUS | Status: DC | PRN

## 2021-04-25 MED ORDER — SUGAMMADEX SODIUM 500 MG/5ML IV SOLN
INTRAVENOUS | Status: AC
  Filled 2021-04-25: qty 5

## 2021-04-25 MED ORDER — ACETAMINOPHEN 500 MG PO TABS: 1000.0000 mg | ORAL_TABLET | ORAL | Status: AC

## 2021-04-25 MED ORDER — OXYCODONE HCL 5 MG/5ML PO SOLN: 5.0000 mg | Freq: Once | ORAL | Status: AC | PRN

## 2021-04-25 MED ORDER — CEFAZOLIN IN SODIUM CHLORIDE 3-0.9 GM/100ML-% IV SOLN
3.0000 g | INTRAVENOUS | Status: AC
  Administered 2021-04-25: 3 g via INTRAVENOUS
  Filled 2021-04-25: qty 100

## 2021-04-25 MED ORDER — PROPOFOL 10 MG/ML IV BOLUS
INTRAVENOUS | Status: AC
  Filled 2021-04-25: qty 20

## 2021-04-25 MED ORDER — IBUPROFEN 800 MG PO TABS: 800.0000 mg | ORAL_TABLET | Freq: Three times a day (TID) | ORAL | 1 refills | Status: AC | PRN

## 2021-04-25 MED ORDER — LIDOCAINE HCL (CARDIAC) PF 100 MG/5ML IV SOSY
PREFILLED_SYRINGE | INTRAVENOUS | Status: DC | PRN
  Administered 2021-04-25: 100 mg via INTRAVENOUS

## 2021-04-25 MED ORDER — GABAPENTIN 300 MG PO CAPS: 300.0000 mg | ORAL_CAPSULE | ORAL | Status: AC

## 2021-04-25 MED ORDER — ORAL CARE MOUTH RINSE: 15.0000 mL | Freq: Once | OROMUCOSAL | Status: AC

## 2021-04-25 MED ORDER — ROCURONIUM BROMIDE 10 MG/ML (PF) SYRINGE
PREFILLED_SYRINGE | INTRAVENOUS | Status: AC
  Filled 2021-04-25: qty 10

## 2021-04-25 MED ORDER — CHLORHEXIDINE GLUCONATE 0.12 % MT SOLN
OROMUCOSAL | Status: AC
  Administered 2021-04-25: 15 mL via OROMUCOSAL
  Filled 2021-04-25: qty 15

## 2021-04-25 MED ORDER — 0.9 % SODIUM CHLORIDE (POUR BTL) OPTIME
TOPICAL | Status: DC | PRN
  Administered 2021-04-25: 500 mL

## 2021-04-25 MED ORDER — BUPIVACAINE-MELOXICAM ER 200-6 MG/7ML IJ SOLN
INTRAMUSCULAR | Status: AC
  Filled 2021-04-25: qty 1

## 2021-04-25 MED ORDER — MIDAZOLAM HCL 2 MG/2ML IJ SOLN
INTRAMUSCULAR | Status: AC
Start: 2021-04-25 — End: ?
  Filled 2021-04-25: qty 2

## 2021-04-25 MED ORDER — MIDAZOLAM HCL 2 MG/2ML IJ SOLN
INTRAMUSCULAR | Status: DC | PRN
  Administered 2021-04-25: 2 mg via INTRAVENOUS

## 2021-04-25 MED ORDER — CHLORHEXIDINE GLUCONATE 0.12 % MT SOLN: 15.0000 mL | Freq: Once | OROMUCOSAL | Status: AC

## 2021-04-25 MED ORDER — EPHEDRINE 5 MG/ML INJ
INTRAVENOUS | Status: AC
  Filled 2021-04-25: qty 5

## 2021-04-25 MED ORDER — OXYCODONE HCL 5 MG PO TABS
ORAL_TABLET | ORAL | Status: AC
  Filled 2021-04-25: qty 1

## 2021-04-25 MED ORDER — DEXAMETHASONE SODIUM PHOSPHATE 10 MG/ML IJ SOLN
INTRAMUSCULAR | Status: DC | PRN
  Administered 2021-04-25: 10 mg via INTRAVENOUS

## 2021-04-25 MED ORDER — KETOROLAC TROMETHAMINE 30 MG/ML IJ SOLN
INTRAMUSCULAR | Status: AC
  Filled 2021-04-25: qty 1

## 2021-04-25 MED ORDER — ONDANSETRON HCL 4 MG/2ML IJ SOLN: 4.0000 mg | Freq: Once | INTRAMUSCULAR | Status: DC | PRN

## 2021-04-25 MED ORDER — FENTANYL CITRATE (PF) 100 MCG/2ML IJ SOLN
INTRAMUSCULAR | Status: DC | PRN
  Administered 2021-04-25: 100 ug via INTRAVENOUS

## 2021-04-25 MED ORDER — PROPOFOL 10 MG/ML IV BOLUS
INTRAVENOUS | Status: DC | PRN
  Administered 2021-04-25: 170 mg via INTRAVENOUS
  Administered 2021-04-25: 30 mg via INTRAVENOUS

## 2021-04-25 MED ORDER — ONDANSETRON HCL 4 MG/2ML IJ SOLN
INTRAMUSCULAR | Status: DC | PRN
  Administered 2021-04-25: 4 mg via INTRAVENOUS

## 2021-04-25 MED ORDER — EPHEDRINE SULFATE (PRESSORS) 50 MG/ML IJ SOLN
INTRAMUSCULAR | Status: DC | PRN
  Administered 2021-04-25: 10 mg via INTRAVENOUS

## 2021-04-25 MED ORDER — DOCUSATE SODIUM 100 MG PO CAPS: 100.0000 mg | ORAL_CAPSULE | Freq: Two times a day (BID) | ORAL | 2 refills | Status: AC | PRN

## 2021-04-25 MED ORDER — LIDOCAINE HCL (PF) 2 % IJ SOLN
INTRAMUSCULAR | Status: AC
  Filled 2021-04-25: qty 5

## 2021-04-25 MED ORDER — HYDROMORPHONE HCL 1 MG/ML IJ SOLN
INTRAMUSCULAR | Status: DC | PRN
  Administered 2021-04-25: .4 mg via INTRAVENOUS
  Administered 2021-04-25: .2 mg via INTRAVENOUS
  Administered 2021-04-25: .4 mg via INTRAVENOUS

## 2021-04-25 MED ORDER — GABAPENTIN 300 MG PO CAPS
ORAL_CAPSULE | ORAL | Status: AC
  Administered 2021-04-25: 300 mg via ORAL
  Filled 2021-04-25: qty 1

## 2021-04-25 MED ORDER — SAXENDA 18 MG/3ML ~~LOC~~ SOPN: 3.0000 mg | PEN_INJECTOR | Freq: Every day | SUBCUTANEOUS | 6 refills | Status: DC

## 2021-04-25 MED ORDER — BUPIVACAINE-EPINEPHRINE 0.5% -1:200000 IJ SOLN
INTRAMUSCULAR | Status: DC | PRN
  Administered 2021-04-25: 20 mL

## 2021-04-25 SURGICAL SUPPLY — 84 items
ADH SKN CLS APL DERMABOND .7 (GAUZE/BANDAGES/DRESSINGS) ×1
BACTOSHIELD CHG 4% 4OZ (MISCELLANEOUS) ×1
BAG DRN RND TRDRP ANRFLXCHMBR (UROLOGICAL SUPPLIES) ×1
BAG URINE DRAIN 2000ML AR STRL (UROLOGICAL SUPPLIES) ×2 IMPLANT
BARRIER ADHS 3X4 INTERCEED (GAUZE/BANDAGES/DRESSINGS) ×1 IMPLANT
BLADE SURG SZ11 CARB STEEL (BLADE) ×2 IMPLANT
BRR ADH 4X3 ABS CNTRL BYND (GAUZE/BANDAGES/DRESSINGS) ×1
CANNULA CAP OBTURATR AIRSEAL 8 (CAP) ×2 IMPLANT
CANNULA DILATOR 5 W/SLV (CANNULA) IMPLANT
CATH FOLEY 2WAY  5CC 16FR (CATHETERS) ×2
CATH FOLEY 2WAY 5CC 16FR (CATHETERS) ×1
CATH URTH 16FR FL 2W BLN LF (CATHETERS) ×1 IMPLANT
COVER TIP SHEARS 8 DVNC (MISCELLANEOUS) ×1 IMPLANT
COVER TIP SHEARS 8MM DA VINCI (MISCELLANEOUS) ×2
COVER WAND RF STERILE (DRAPES) ×2 IMPLANT
DEFOGGER SCOPE WARMER CLEARIFY (MISCELLANEOUS) ×2 IMPLANT
DERMABOND ADVANCED (GAUZE/BANDAGES/DRESSINGS) ×1
DERMABOND ADVANCED .7 DNX12 (GAUZE/BANDAGES/DRESSINGS) ×1 IMPLANT
DRAPE 3/4 80X56 (DRAPES) ×4 IMPLANT
DRAPE ARM DVNC X/XI (DISPOSABLE) ×3 IMPLANT
DRAPE COLUMN DVNC XI (DISPOSABLE) ×1 IMPLANT
DRAPE DA VINCI XI ARM (DISPOSABLE) ×6
DRAPE DA VINCI XI COLUMN (DISPOSABLE) ×2
DRAPE ROBOT W/ LEGGING 30X125 (DRAPES) ×2 IMPLANT
ELECT REM PT RETURN 9FT ADLT (ELECTROSURGICAL) ×2
ELECTRODE REM PT RTRN 9FT ADLT (ELECTROSURGICAL) ×1 IMPLANT
GAUZE 4X4 16PLY ~~LOC~~+RFID DBL (SPONGE) ×4 IMPLANT
GLOVE BIO SURGEON STRL SZ 6.5 (GLOVE) ×8 IMPLANT
GLOVE SURG POLY ORTHO LF SZ7.5 (GLOVE) IMPLANT
GLOVE SURG UNDER LTX SZ7 (GLOVE) ×8 IMPLANT
GOWN STRL REUS W/ TWL LRG LVL3 (GOWN DISPOSABLE) ×4 IMPLANT
GOWN STRL REUS W/TWL LRG LVL3 (GOWN DISPOSABLE) ×8
GRASPER SUT TROCAR 14GX15 (MISCELLANEOUS) ×2 IMPLANT
GYRUS RUMI II 2.5CM BLUE (DISPOSABLE)
GYRUS RUMI II 3.5CM BLUE (DISPOSABLE)
IRRIGATION STRYKERFLOW (MISCELLANEOUS) IMPLANT
IRRIGATOR STRYKERFLOW (MISCELLANEOUS) ×2
IV NS 1000ML (IV SOLUTION) ×2
IV NS 1000ML BAXH (IV SOLUTION) ×1 IMPLANT
KIT IMAGING PINPOINTPAQ (MISCELLANEOUS) IMPLANT
KIT PINK PAD W/HEAD ARE REST (MISCELLANEOUS) ×2
KIT PINK PAD W/HEAD ARM REST (MISCELLANEOUS) ×1 IMPLANT
LABEL OR SOLS (LABEL) ×2 IMPLANT
MANIFOLD NEPTUNE II (INSTRUMENTS) ×2 IMPLANT
MANIPULATOR VCARE LG CRV RETR (MISCELLANEOUS) ×1 IMPLANT
NEEDLE VERESS 14GA 120MM (NEEDLE) ×2 IMPLANT
NS IRRIG 1000ML POUR BTL (IV SOLUTION) ×2 IMPLANT
OCCLUDER COLPOPNEUMO (BALLOONS) ×2 IMPLANT
PACK GYN LAPAROSCOPIC (MISCELLANEOUS) ×2 IMPLANT
PAD OB MATERNITY 4.3X12.25 (PERSONAL CARE ITEMS) ×2 IMPLANT
RUMI II 3.0CM BLUE KOH-EFFICIE (DISPOSABLE) IMPLANT
RUMI II GYRUS 2.5CM BLUE (DISPOSABLE) IMPLANT
RUMI II GYRUS 3.5CM BLUE (DISPOSABLE) IMPLANT
SCISSORS METZENBAUM CVD 33 (INSTRUMENTS) ×2 IMPLANT
SCRUB CHG 4% DYNA-HEX 4OZ (MISCELLANEOUS) ×1 IMPLANT
SEAL CANN UNIV 5-8 DVNC XI (MISCELLANEOUS) ×3 IMPLANT
SEAL XI 5MM-8MM UNIVERSAL (MISCELLANEOUS) ×6
SEALER VESSEL DA VINCI XI (MISCELLANEOUS) ×2
SEALER VESSEL EXT DVNC XI (MISCELLANEOUS) IMPLANT
SET CYSTO W/LG BORE CLAMP LF (SET/KITS/TRAYS/PACK) IMPLANT
SET TUBE FILTERED XL AIRSEAL (SET/KITS/TRAYS/PACK) ×2 IMPLANT
SOL PREP PVP 2OZ (MISCELLANEOUS) ×2
SOLUTION ELECTROLUBE (MISCELLANEOUS) ×2 IMPLANT
SOLUTION PREP PVP 2OZ (MISCELLANEOUS) ×1 IMPLANT
SURGILUBE 2OZ TUBE FLIPTOP (MISCELLANEOUS) ×2 IMPLANT
SUT DVC VLOC 180 0 12IN GS21 (SUTURE) ×2
SUT MNCRL 4-0 (SUTURE) ×4
SUT MNCRL 4-0 27XMFL (SUTURE) ×2
SUT VIC AB 0 CT1 36 (SUTURE) ×1 IMPLANT
SUT VIC AB 2-0 CT1 27 (SUTURE) ×2
SUT VIC AB 2-0 CT1 TAPERPNT 27 (SUTURE) ×1 IMPLANT
SUT VICRYL 0 AB UR-6 (SUTURE) ×2 IMPLANT
SUTURE DVC VLC 180 0 12IN GS21 (SUTURE) ×1 IMPLANT
SUTURE MNCRL 4-0 27XMF (SUTURE) ×1 IMPLANT
SYR 10ML LL (SYRINGE) ×2 IMPLANT
SYR 50ML LL SCALE MARK (SYRINGE) ×2 IMPLANT
TIP RUMI ORANGE 6.7MMX12CM (TIP) IMPLANT
TIP UTERINE 5.1X6CM LAV DISP (MISCELLANEOUS) IMPLANT
TIP UTERINE 6.7X10CM GRN DISP (MISCELLANEOUS) IMPLANT
TIP UTERINE 6.7X6CM WHT DISP (MISCELLANEOUS) IMPLANT
TIP UTERINE 6.7X8CM BLUE DISP (MISCELLANEOUS) IMPLANT
TROCAR ENDO BLADELESS 11MM (ENDOMECHANICALS) ×2 IMPLANT
TUBING EVAC SMOKE HEATED PNEUM (TUBING) ×1 IMPLANT
WATER STERILE IRR 500ML POUR (IV SOLUTION) ×2 IMPLANT

## 2021-04-25 NOTE — H&P (Signed)
? ? ?GYNECOLOGY PREOPERATIVE HISTORY AND PHYSICAL  ? ?Subjective:  ?Erin Mcdonald is a 46 y.o. G0P0000 here for surgical management of menorrhagia, dysmenorrhea, iron deficiency anemia.   Also with a h/o morbid obesity. She has a history of menorrhagia with regular cycles, PMDD, and dysmenorrhea. Has trialed Lysteda with initial improvement, however they have now become heavy again. Has a history of anemia requiring iron supplementation. Often times feels drained and tired for at least 2-3 weeks out of the month.  ? ? ?Proposed surgery: Robotic Total Laparoscopic Hysterectomy with Bilateral Salpingectomy ? ? ? ?Menstrual History: ?Patient's last menstrual period was 03/02/2021 (exact date). ?Period Duration (Days): 7 ?Period Pattern: Regular ?Menstrual Flow: Heavy ?Menstrual Control: Maxi pad (menstraul underwear) ?Menstrual Control Change Freq (Hours): 2.5 ?Dysmenorrhea: (!) Moderate ?Dysmenorrhea Symptoms: Cramping, Throbbing, Headache, Diarrhea ?  ?  ?GYN History:  ?Last pap smear: 08/31/2020. Results were: NILM, HR HPV+ (neg 16/18/45) ?Contraception: None.  ?Last mammogram: 02/10/2020.  Results were normal.  ? ? ?Past Medical History:  ?Diagnosis Date  ? Anxiety   ? Depression   ? Genital herpes   ? Sleep apnea   ? ? ?Past Surgical History:  ?Procedure Laterality Date  ? LAPAROSCOPIC GASTRIC SLEEVE RESECTION  11/15/2012  ? [facility], Dr Leda Gauze  ? TONSILLECTOMY  2013  ? ? ?OB History  ?Gravida Para Term Preterm AB Living  ?0 0 0 0 0 0  ?SAB IAB Ectopic Multiple Live Births  ?0 0 0 0 0  ? ? ?Family History  ?Problem Relation Age of Onset  ? Heart attack Mother   ? Diabetes Paternal Grandmother   ? Diabetes Paternal Grandfather   ? Colon cancer Father 16  ? Breast cancer Neg Hx   ? Ovarian cancer Neg Hx   ? ? ?Social History  ? ?Socioeconomic History  ? Marital status: Single  ?  Spouse name: Not on file  ? Number of children: Not on file  ? Years of education: Not on file  ? Highest education level: Not on file   ?Occupational History  ? Not on file  ?Tobacco Use  ? Smoking status: Every Day  ?  Packs/day: 1.00  ?  Types: Cigarettes  ? Smokeless tobacco: Never  ? Tobacco comments:  ?  0.5-1ppd 08/25/2020  ?Vaping Use  ? Vaping Use: Never used  ?Substance and Sexual Activity  ? Alcohol use: Yes  ?  Comment: occasionally  ? Drug use: No  ? Sexual activity: Not Currently  ?  Birth control/protection: None  ?Other Topics Concern  ? Not on file  ?Social History Narrative  ? Not on file  ? ?Social Determinants of Health  ? ?Financial Resource Strain: Not on file  ?Food Insecurity: Not on file  ?Transportation Needs: Not on file  ?Physical Activity: Not on file  ?Stress: Not on file  ?Social Connections: Not on file  ?Intimate Partner Violence: Not on file  ? ? ?No current facility-administered medications on file prior to encounter.  ? ?Current Outpatient Medications on File Prior to Encounter  ?Medication Sig Dispense Refill  ? ALPRAZolam (XANAX) 1 MG tablet Take 1 tablet po TID prn anxiety (Patient taking differently: Take 1 mg by mouth 3 (three) times daily.) 90 tablet 2  ? azelastine (ASTELIN) 0.1 % nasal spray Place 1 spray into both nostrils daily.    ? Cholecalciferol (VITAMIN D3 PO) Take by mouth.    ? cyanocobalamin (,VITAMIN B-12,) 1000 MCG/ML injection Inject 57m IM once weekly  for 6 weeks then inject 66m IM monthly 10 mL 1  ? escitalopram (LEXAPRO) 20 MG tablet Take 1 tablet po QD 90 tablet 1  ? Ferrous Sulfate (IRON PO) Take by mouth.    ? meclizine (ANTIVERT) 25 MG tablet Take 1 tablet (25 mg total) by mouth 3 (three) times daily as needed for dizziness. 30 tablet 0  ? montelukast (SINGULAIR) 10 MG tablet Take 1 tablet (10 mg total) by mouth at bedtime. 90 tablet 1  ? omeprazole (PRILOSEC) 20 MG capsule Take 1 capsule (20 mg total) by mouth 2 (two) times daily. 180 capsule 1  ? valACYclovir (VALTREX) 1000 MG tablet Take 1 tablet (1,000 mg total) by mouth daily. 30 tablet 5  ? Insulin Pen Needle (PEN NEEDLES) 32G X 5  MM MISC To use daily with dosage of saxenda 100 each 3  ? norethindrone (AYGESTIN) 5 MG tablet Take 1 tablet (5 mg total) by mouth daily. (Patient not taking: Reported on 04/13/2021) 30 tablet 0  ? SAXENDA 18 MG/3ML SOPN Inject 18 mg into the skin daily.    ? ?Allergies  ?Allergen Reactions  ? Codeine Other (See Comments)  ?  Cause pt to be hyper  ? ? ? ? ?Review of Systems ?Constitutional: No recent fever/chills/sweats ?Respiratory: No recent cough/bronchitis ?Cardiovascular: No chest pain ?Gastrointestinal: No recent nausea/vomiting/diarrhea ?Genitourinary: No UTI symptoms ?Hematologic/lymphatic:No history of coagulopathy or recent blood thinner use  ?  ?Objective:  ? ?Blood pressure 135/69, pulse 93, temperature 98.1 ?F (36.7 ?C), temperature source Temporal, resp. rate 18, height '5\' 9"'$  (1.753 m), weight 133.8 kg, last menstrual period 04/04/2021, SpO2 96 %.  Body mass index is 43.56 kg/m?. ? ?CONSTITUTIONAL: Well-developed, well-nourished female in no acute distress.  ?HENT:  Normocephalic, atraumatic, External right and left ear normal. Oropharynx is clear and moist ?EYES: Conjunctivae and EOM are normal. Pupils are equal, round, and reactive to light. No scleral icterus.  ?NECK: Normal range of motion, supple, no masses ?SKIN: Skin is warm and dry. No rash noted. Not diaphoretic. No erythema. No pallor. ?NEUROLOGIC: Alert and oriented to person, place, and time. Normal reflexes, muscle tone coordination. No cranial nerve deficit noted. ?PSYCHIATRIC: Normal mood and affect. Normal behavior. Normal judgment and thought content. ?CARDIOVASCULAR: Normal heart rate noted, regular rhythm ?RESPIRATORY: Effort and breath sounds normal, no problems with respiration noted ?ABDOMEN: Soft, nontender, nondistended. ?PELVIC: Deferred ?MUSCULOSKELETAL: Normal range of motion. No edema and no tenderness. 2+ distal pulses. ? ? ? ?Labs: ?Results for orders placed or performed during the hospital encounter of 04/19/21  ?Hemoglobin  A1c  ?Result Value Ref Range  ? Hgb A1c MFr Bld 5.4 4.8 - 5.6 %  ? Mean Plasma Glucose 108.28 mg/dL  ?CBC  ?Result Value Ref Range  ? WBC 12.8 (H) 4.0 - 10.5 K/uL  ? RBC 4.64 3.87 - 5.11 MIL/uL  ? Hemoglobin 14.6 12.0 - 15.0 g/dL  ? HCT 44.9 36.0 - 46.0 %  ? MCV 96.8 80.0 - 100.0 fL  ? MCH 31.5 26.0 - 34.0 pg  ? MCHC 32.5 30.0 - 36.0 g/dL  ? RDW 13.7 11.5 - 15.5 %  ? Platelets 365 150 - 400 K/uL  ? nRBC 0.0 0.0 - 0.2 %  ?Basic metabolic panel  ?Result Value Ref Range  ? Sodium 137 135 - 145 mmol/L  ? Potassium 3.7 3.5 - 5.1 mmol/L  ? Chloride 104 98 - 111 mmol/L  ? CO2 25 22 - 32 mmol/L  ? Glucose, Bld 88 70 -  99 mg/dL  ? BUN 12 6 - 20 mg/dL  ? Creatinine, Ser 0.43 (L) 0.44 - 1.00 mg/dL  ? Calcium 9.2 8.9 - 10.3 mg/dL  ? GFR, Estimated >60 >60 mL/min  ? Anion gap 8 5 - 15  ?Type and screen  ?Result Value Ref Range  ? ABO/RH(D) A POS   ? Antibody Screen NEG   ? Sample Expiration 05/03/2021,2359   ? Extend sample reason    ?  NO TRANSFUSIONS OR PREGNANCY IN THE PAST 3 MONTHS ?Performed at [facility], [address] [place], [place] [address] ?  ? ? ? ? ?Lab Results  ?Component Value Date  ? TSH 2.380 08/31/2020  ? ? ? ? ?Imaging Studies: ?2021 ultrasound ?Findings:  ?The uterus is anteverted and measures 7.1 x 4.4 x 3.5 cm. ?Echo texture is homogenous without evidence of focal masses. ?The Endometrium measures 7.3 mm. ?  ?Right Ovary measures 3.5 x 3.2 x 1.8 cm. It is normal in appearance. ?Left Ovary measures 3.6 x 3.0 x 2.9 cm. It is normal in appearance. ?Survey of the adnexa demonstrates no adnexal masses. ?There is no free fluid in the cul de sac. ?  ?Impression: ?1. Normal pelvic ultrasound.  ?  ?Recommendations: ?1.Clinical correlation with the patient's History and Physical Exam. ? ?Assessment:  ?  ?Menorrhagia with regular cycle   ?PMDD (premenstrual dysphoric disorder)  ?Pre-diabetes  ?Body mass index (BMI) of 40.1-44.9 in adult [facility])  ? ?Plan:  ? ?- Counseling: Procedure, risks, reasons,  benefits and complications (including injury to bowel, bladder, major blood vessel, ureter, bleeding, possibility of transfusion, infection, or fistula formation) reviewed in detail. Likelihood of success in all

## 2021-04-25 NOTE — Transfer of Care (Signed)
Immediate Anesthesia Transfer of Care Note ? ?Patient: Erin Mcdonald ? ?Procedure(s) Performed: XI ROBOTIC ASSISTED LAPAROSCOPIC HYSTERECTOMY AND SALPINGECTOMY (Bilateral) ? ?Patient Location: PACU ? ?Anesthesia Type:General ? ?Level of Consciousness: awake, alert  and oriented ? ?Airway & Oxygen Therapy: Patient Spontanous Breathing and Patient connected to face mask oxygen ? ?Post-op Assessment: Report given to RN and Post -op Vital signs reviewed and stable ? ?Post vital signs: stable ? ?Last Vitals:  ?Vitals Value Taken Time  ?BP 137/84 04/25/21 1306  ?Temp 37.1 ?C 04/25/21 1305  ?Pulse 86 04/25/21 1310  ?Resp 14 04/25/21 1310  ?SpO2 98 % 04/25/21 1310  ?Vitals shown include unvalidated device data. ? ?Last Pain:  ?Vitals:  ? 04/25/21 0904  ?TempSrc: Temporal  ?PainSc: 0-No pain  ?   ? ?  ? ?Complications: No notable events documented. ?

## 2021-04-25 NOTE — Anesthesia Procedure Notes (Signed)
Procedure Name: Intubation ?Date/Time: 04/25/2021 10:12 AM ?Performed by: Natasha Mead, CRNA ?Pre-anesthesia Checklist: Patient identified, Emergency Drugs available, Suction available and Patient being monitored ?Patient Re-evaluated:Patient Re-evaluated prior to induction ?Oxygen Delivery Method: Circle system utilized ?Preoxygenation: Pre-oxygenation with 100% oxygen ?Induction Type: IV induction ?Ventilation: Mask ventilation without difficulty ?Laryngoscope Size: McGraph and 3 ?Grade View: Grade I ?Tube type: Oral ?Tube size: 7.0 mm ?Number of attempts: 1 ?Airway Equipment and Method: Stylet and Oral airway ?Placement Confirmation: ETT inserted through vocal cords under direct vision, positive ETCO2 and breath sounds checked- equal and bilateral ?Secured at: 20 cm ?Tube secured with: Tape ?Dental Injury: Teeth and Oropharynx as per pre-operative assessment  ? ? ? ? ?

## 2021-04-25 NOTE — Op Note (Addendum)
Procedure(s): XI ROBOTIC ASSISTED LAPAROSCOPIC HYSTERECTOMY AND SALPINGECTOMY Procedure Note  Erin Mcdonald female 46 y.o. 04/25/2021  Indications: The patient is a morbidly obese 46 y.o. G0P0000 female with menorrhagia, dysmenorrhea, and PMDD  Pre-operative Diagnosis: Menorrhagia, dysmenorrhea, PMDD, morbid obesity  Post-operative Diagnosis: Same  Surgeon: Rubie Maid, MD  Assistants:  Jeannie Fend, MD  Anesthesia: General endotracheal anesthesia  Findings: The uterus was sounded to 8 cm Fallopian tubes and ovaries appeared normal.   Procedure Details: The patient was seen in the Holding Room. The risks, benefits, complications, treatment options, and expected outcomes were discussed with the patient.  The patient concurred with the proposed plan, giving informed consent.  The site of surgery properly noted/marked. The patient was taken to the Operating Room, identified as Erin Mcdonald and the procedure verified as Procedure(s) (LRB): XI ROBOTIC ASSISTED LAPAROSCOPIC HYSTERECTOMY AND SALPINGECTOMY (Bilateral). A Time Out was held and the above information confirmed.  She was then placed under general anesthesia without difficulty. She was placed in the dorsal lithotomy position, and was prepped and draped in a sterile manner. Foley catheter was placed.  A sterile speculum was placed into the vagina.  The cervix was grasped with a single-tooth tenaculum and the uterus was sounded to 8 cm. The balloon manipulator was then properly placed. The balloon was filled to approximately 3 cc of saline. The cervical cup was placed around the cervix. A vaginal  balloon occluder a lap pad was then placed inside the vagina to help with pneumoperitoneum.    Attention was then turned to the abdomen, where  an 8 mm incision was made supraumubilcally.  The Veress needle was passed and a pneumoperitoneum was established.  The Veress needle was then removed and an 8 mm port was placed supraumbilically.  The  daVinci camera was then placed supraumbilically. Three more ports were then placed. There were two 8 mm ports that were placed 10 cm laterally to the umbilicus and 2 cm inferiorly on either side.  The 11 mm assistant port was then placed in the upper left quadrant 2 cm medial and superior to the left lateral port. The daVinci robot was then docked in the normal fashion. The patient was placed in steep Trendelenburg positioning.  Inspection of the pelvis showed a normal uterus, ovaries, and tubes. Fallopian tubes were previously surgically interrupted.  The right mesosalpinx of the fallopian tube was cauterized and cut using the vessel sealer device.    The utero-ovarian ligament was also coagulated and cut. The round ligament was coagulated and cut. A bladder flap was created and the bladder was dissected down from the cervix.This entire procedure was then repeated on the left side.   The uterine arteries were then skeletonized, and cauterized using the vessel sealer device.  The blue balloon cuff was then identified and anincision was made in the cervicovaginal junction on top of the vaginal cuff. This was also repeated posteriorly. The incision was extended laterally, freeing the uterus from the surrounding vagina. The uterus was then delivered posteriorly through the vagina using the robotic assistant.    The vaginal cuff was closed with a running suture of 0 Vicryl V-lock. The ureters were identified bilaterally. The entire pelvis was hemostatic. The right assistant site was closed with a suture of -0 Vicryl in a figure-of eight manner. A total of 6 ml of ZynRelief anesthetic gel was placed into her incisions. The skin was closed with 4-0 Monocryl using subcuticular stitches. All incisions were injected with local anesthetic (Sensorcaine  0.5%, total of 20 ml). Dermabond was placed over all incisions.  Dr. Jeannie Fend, as an experienced assistant, was required given the standard of surgical care given  the complexity of the case.  This assistant was needed for exposure, dissection, suctioning, retraction, instrument exchange, and for overall help during the procedure.  The final needle, sponge, and instrument count was correct. The patient tolerated the procedure well. Patient to the recovery room in good condition.      Estimated Blood Loss:  75 ml      Drains: foley catheterization prior to procedure with 500 ml of clear urine at end of the procedure.          Total IV Fluids:  1300 ml  Specimens: Uterus with bilateral fallopian tubes         Implants: None         Complications:  None; patient tolerated the procedure well.         Disposition: PACU - hemodynamically stable.         Condition: stable   Rubie Maid, MD [facility]

## 2021-04-25 NOTE — Anesthesia Preprocedure Evaluation (Signed)
Anesthesia Evaluation  ?Patient identified by MRN, date of birth, ID band ?Patient awake ? ? ? ?Reviewed: ?Allergy & Precautions, NPO status , Patient's Chart, lab work & pertinent test results ? ?History of Anesthesia Complications ?Negative for: history of anesthetic complications ? ?Airway ?Mallampati: III ? ? ?Neck ROM: Full ? ? ? Dental ?no notable dental hx. ? ?  ?Pulmonary ?sleep apnea , Current Smoker (1 ppd) and Patient abstained from smoking.,  ?  ?Pulmonary exam normal ?breath sounds clear to auscultation ? ? ? ? ? ? Cardiovascular ?Exercise Tolerance: Good ?negative cardio ROS ?Normal cardiovascular exam ?Rhythm:Regular Rate:Normal ? ? ?  ?Neuro/Psych ?PSYCHIATRIC DISORDERS Anxiety Depression negative neurological ROS ?   ? GI/Hepatic ?GERD  Medicated and Controlled,  ?Endo/Other  ?Class 3 obesity ? Renal/GU ?negative Renal ROS  ? ?  ?Musculoskeletal ? ? Abdominal ?  ?Peds ? Hematology ? ?(+) Blood dyscrasia, anemia ,   ?Anesthesia Other Findings ? ? Reproductive/Obstetrics ? ?  ? ? ? ? ? ? ? ? ? ? ? ? ? ?  ?  ? ? ? ? ? ? ? ? ?Anesthesia Physical ?Anesthesia Plan ? ?ASA: 3 ? ?Anesthesia Plan: General  ? ?Post-op Pain Management:   ? ?Induction: Intravenous ? ?PONV Risk Score and Plan: 2 and Ondansetron, Dexamethasone and Treatment may vary due to age or medical condition ? ?Airway Management Planned: Oral ETT ? ?Additional Equipment:  ? ?Intra-op Plan:  ? ?Post-operative Plan: Extubation in OR ? ?Informed Consent: I have reviewed the patients History and Physical, chart, labs and discussed the procedure including the risks, benefits and alternatives for the proposed anesthesia with the patient or authorized representative who has indicated his/her understanding and acceptance.  ? ? ? ?Dental advisory given ? ?Plan Discussed with: CRNA ? ?Anesthesia Plan Comments: (Patient consented for risks of anesthesia including but not limited to:  ?- adverse reactions to  medications ?- damage to eyes, teeth, lips or other oral mucosa ?- nerve damage due to positioning  ?- sore throat or hoarseness ?- damage to heart, brain, nerves, lungs, other parts of body or loss of life ? ?Informed patient about role of CRNA in peri- and intra-operative care.  Patient voiced understanding.)  ? ? ? ? ? ? ?Anesthesia Quick Evaluation ? ?

## 2021-04-25 NOTE — Anesthesia Postprocedure Evaluation (Signed)
Anesthesia Post Note ? ?Patient: Erin Mcdonald ? ?Procedure(s) Performed: XI ROBOTIC ASSISTED LAPAROSCOPIC HYSTERECTOMY AND SALPINGECTOMY (Bilateral: Abdomen) ? ?Patient location during evaluation: PACU ?Anesthesia Type: General ?Level of consciousness: awake and alert, oriented and patient cooperative ?Pain management: pain level controlled ?Vital Signs Assessment: post-procedure vital signs reviewed and stable ?Respiratory status: spontaneous breathing, nonlabored ventilation and respiratory function stable ?Cardiovascular status: blood pressure returned to baseline and stable ?Postop Assessment: adequate PO intake ?Anesthetic complications: no ? ? ?No notable events documented. ? ? ?Last Vitals:  ?Vitals:  ? 04/25/21 1344 04/25/21 1401  ?BP: 132/75 (!) 154/94  ?Pulse: 80 93  ?Resp: 17 18  ?Temp:  37.3 ?C  ?SpO2: 95% 97%  ?  ?Last Pain:  ?Vitals:  ? 04/25/21 1401  ?TempSrc: Temporal  ?PainSc: 2   ? ? ?  ?  ?  ?  ?  ?  ? ?Darrin Nipper ? ? ? ? ?

## 2021-04-25 NOTE — Discharge Instructions (Signed)

## 2021-04-26 ENCOUNTER — Encounter: Payer: Self-pay | Admitting: Obstetrics and Gynecology

## 2021-04-26 ENCOUNTER — Telehealth: Payer: Self-pay | Admitting: Obstetrics and Gynecology

## 2021-04-26 NOTE — Telephone Encounter (Signed)
Pt called to schedule her Post- op (5-9), she wanted to make provider aware that pain medication is not helping that she is still experiencing 10/10 pain. Please advise.  ?

## 2021-04-27 ENCOUNTER — Other Ambulatory Visit: Payer: Self-pay

## 2021-04-27 DIAGNOSIS — Z6841 Body Mass Index (BMI) 40.0 and over, adult: Secondary | ICD-10-CM

## 2021-04-27 DIAGNOSIS — R7301 Impaired fasting glucose: Secondary | ICD-10-CM

## 2021-04-27 LAB — SURGICAL PATHOLOGY

## 2021-04-27 MED ORDER — PEN NEEDLES 32G X 5 MM MISC: 3 refills | Status: AC

## 2021-04-27 MED ORDER — OXYCODONE HCL 5 MG PO TABS: 5.0000 mg | ORAL_TABLET | Freq: Four times a day (QID) | ORAL | 0 refills | Status: DC | PRN

## 2021-04-28 ENCOUNTER — Encounter: Payer: Self-pay | Admitting: Obstetrics and Gynecology

## 2021-05-09 ENCOUNTER — Encounter: Payer: Self-pay | Admitting: Radiology

## 2021-05-09 ENCOUNTER — Ambulatory Visit
Admission: RE | Admit: 2021-05-09 | Discharge: 2021-05-09 | Disposition: A | Discharge status: Home / Self Care | Payer: BC Managed Care – PPO | Source: Ambulatory Visit | Attending: Nurse Practitioner | Admitting: Nurse Practitioner

## 2021-05-09 DIAGNOSIS — Z1231 Encounter for screening mammogram for malignant neoplasm of breast: Secondary | ICD-10-CM | POA: Insufficient documentation

## 2021-05-12 NOTE — Progress Notes (Signed)
Negative mammogram

## 2021-05-15 ENCOUNTER — Encounter: Payer: Self-pay | Admitting: Obstetrics and Gynecology

## 2021-05-16 NOTE — Progress Notes (Signed)
? ? ?  OBSTETRICS/GYNECOLOGY POST-OPERATIVE CLINIC VISIT ? ?Subjective:  ?  ? Erin Mcdonald is a 46 y.o. female who presents to the clinic 3 weeks status post surgical management of menorrhagia, dysmenorrhea, iron deficiency anemia for Robotic Total Laparoscopic Hysterectomy with Bilateral Salpingectomy. Eating a regular diet without difficulty. Bowel movements are normal. The patient is not having any pain. ? ?Patient has concerns about urinary frequency and lower back pain with mild abdominal cramping,onset of symptoms x4 days ago. ? ?The following portions of the patient's history were reviewed and updated as appropriate: allergies, current medications, past family history, past medical history, past social history, past surgical history, and problem list. ? ?Review of Systems ?Pertinent items noted in HPI and remainder of comprehensive ROS otherwise negative. ?  ?Objective:  ? ?BP 125/68   Pulse 89   Ht '5\' 9"'$  (1.753 m)   Wt 297 lb 6.4 oz (134.9 kg)   LMP 04/04/2021   BMI 43.92 kg/m?  Body mass index is 43.92 kg/m?. ? ?General:  alert and no distress  ?Abdomen: soft, bowel sounds active, non-tender  ?Incision:   healing well, no drainage, no erythema, no hernia, no seroma, no swelling, no dehiscence, incision well approximated  ? ? ?Pathology:  ?A.  UTERUS, CERVIX, BILATERAL FALLOPIAN TUBES; HYSTERECTOMY AND  ?BILATERAL SALPINGECTOMY.  ?-CERVIX WITH ACUTE AND CHRONIC CERVICITIS AND NO DYSPLASIA  ?-BENIGNENDOMETRIUM WITH NO ATYPIA  ?-UTERINE SEROSAL SURFACE ADHESIONS  ?-BENIGN UNREMARKABLE MYOMETRIUM  ?-BILATERAL FALLOPIAN TUBES WITH NO HISTOLOGIC ABNORMALITY  ? ? ? ?Labs:  ?Results for orders placed or performed in visit on 05/17/21  ?POCT Urinalysis Dipstick  ?Result Value Ref Range  ? Color, UA    ? Clarity, UA    ? Glucose, UA Negative Negative  ? Bilirubin, UA Negative   ? Ketones, UA trace   ? Spec Grav, UA 1.025 1.010 - 1.025  ? Blood, UA postive   ? pH, UA 6.0 5.0 - 8.0  ? Protein, UA Positive (A) Negative   ? Urobilinogen, UA negative (A) 0.2 or 1.0 E.U./dL  ? Nitrite, UA postive   ? Leukocytes, UA Moderate (2+) (A) Negative  ? Appearance    ? Odor    ? ? ?Assessment:  ? ?Patient s/p robotic TLH with bilateral salpingectomy  ?UTI ?Otherwise doing well postoperatively. ?  ?Plan:  ? ?1. Continue any current medications as instructed by provider. ?2. Wound care discussed. ?3. Operative findings again reviewed. Pathology report discussed. ?4.  UTI noted on today's UA (positive nitrates). Will prescribe Bactrim. Advised on increasing hydration and use of AZO or cranberry juice for symptoms.  ?5. Activity restrictions: none ?6. Anticipated return to work: on Monday ?7. Follow up:  3-4 weeks  for final post-op check.  ? ? ? ?Rubie Maid, MD ?[facility] ? ?

## 2021-05-17 ENCOUNTER — Encounter: Payer: Self-pay | Admitting: Obstetrics and Gynecology

## 2021-05-17 ENCOUNTER — Ambulatory Visit (INDEPENDENT_AMBULATORY_CARE_PROVIDER_SITE_OTHER): Payer: BC Managed Care – PPO | Admitting: Obstetrics and Gynecology

## 2021-05-17 VITALS — BP 125/68 | HR 89 | Ht 69.0 in | Wt 297.4 lb

## 2021-05-17 DIAGNOSIS — Z09 Encounter for follow-up examination after completed treatment for conditions other than malignant neoplasm: Secondary | ICD-10-CM

## 2021-05-17 DIAGNOSIS — Z90710 Acquired absence of both cervix and uterus: Secondary | ICD-10-CM

## 2021-05-17 DIAGNOSIS — N3001 Acute cystitis with hematuria: Secondary | ICD-10-CM

## 2021-05-17 LAB — POCT URINALYSIS DIPSTICK
Bilirubin, UA: NEGATIVE
Glucose, UA: NEGATIVE
Protein, UA: POSITIVE — AB
Spec Grav, UA: 1.025 (ref 1.010–1.025)
Urobilinogen, UA: NEGATIVE E.U./dL — AB
pH, UA: 6 (ref 5.0–8.0)

## 2021-05-17 MED ORDER — SULFAMETHOXAZOLE-TRIMETHOPRIM 800-160 MG PO TABS: 1.0000 | ORAL_TABLET | Freq: Two times a day (BID) | ORAL | 0 refills | Status: AC

## 2021-05-17 NOTE — Patient Instructions (Signed)
Urinary Tract Infection, Adult A urinary tract infection (UTI) is an infection of any part of the urinary tract. The urinary tract includes: The kidneys. The ureters. The bladder. The urethra. These organs make, store, and get rid of pee (urine) in the body. What are the causes? This infection is caused by germs (bacteria) in your genital area. These germs grow and cause swelling (inflammation) of your urinary tract. What increases the risk? The following factors may make you more likely to develop this condition: Using a small, thin tube (catheter) to drain pee. Not being able to control when you pee or poop (incontinence). Being female. If you are female, these things can increase the risk: Using these methods to prevent pregnancy: A medicine that kills sperm (spermicide). A device that blocks sperm (diaphragm). Having low levels of a female hormone (estrogen). Being pregnant. You are more likely to develop this condition if: You have genes that add to your risk. You are sexually active. You take antibiotic medicines. You have trouble peeing because of: A prostate that is bigger than normal, if you are female. A blockage in the part of your body that drains pee from the bladder. A kidney stone. A nerve condition that affects your bladder. Not getting enough to drink. Not peeing often enough. You have other conditions, such as: Diabetes. A weak disease-fighting system (immune system). Sickle cell disease. Gout. Injury of the spine. What are the signs or symptoms? Symptoms of this condition include: Needing to pee right away. Peeing small amounts often. Pain or burning when peeing. Blood in the pee. Pee that smells bad or not like normal. Trouble peeing. Pee that is cloudy. Fluid coming from the vagina, if you are female. Pain in the belly or lower back. Other symptoms include: Vomiting. Not feeling hungry. Feeling mixed up (confused). This may be the first symptom in  older adults. Being tired and grouchy (irritable). A fever. Watery poop (diarrhea). How is this treated? Taking antibiotic medicine. Taking other medicines. Drinking enough water. In some cases, you may need to see a specialist. Follow these instructions at home:  Medicines Take over-the-counter and prescription medicines only as told by your doctor. If you were prescribed an antibiotic medicine, take it as told by your doctor. Do not stop taking it even if you start to feel better. General instructions Make sure you: Pee until your bladder is empty. Do not hold pee for a long time. Empty your bladder after sex. Wipe from front to back after peeing or pooping if you are a female. Use each tissue one time when you wipe. Drink enough fluid to keep your pee pale yellow. Keep all follow-up visits. Contact a doctor if: You do not get better after 1-2 days. Your symptoms go away and then come back. Get help right away if: You have very bad back pain. You have very bad pain in your lower belly. You have a fever. You have chills. You feeling like you will vomit or you vomit. Summary A urinary tract infection (UTI) is an infection of any part of the urinary tract. This condition is caused by germs in your genital area. There are many risk factors for a UTI. Treatment includes antibiotic medicines. Drink enough fluid to keep your pee pale yellow. This information is not intended to replace advice given to you by your health care provider. Make sure you discuss any questions you have with your health care provider. Document Revised: 08/08/2019 Document Reviewed: 08/08/2019 Elsevier Patient Education    2023 Elsevier Inc.  

## 2021-06-08 ENCOUNTER — Ambulatory Visit: Payer: BC Managed Care – PPO | Admitting: Nurse Practitioner

## 2021-06-08 ENCOUNTER — Encounter: Payer: BC Managed Care – PPO | Admitting: Obstetrics and Gynecology

## 2021-06-08 ENCOUNTER — Telehealth: Payer: Self-pay | Admitting: Obstetrics and Gynecology

## 2021-06-08 NOTE — Progress Notes (Deleted)
    OBSTETRICS/GYNECOLOGY POST-OPERATIVE CLINIC VISIT  Subjective:     Erin Mcdonald is a 46 y.o. female who presents to the clinic 6 weeks status post Robotic Total Laparoscopic Hysterectomy with Bilateral Salpingectomy for menorrhagia, dysmenorrhea, iron deficiency anemia . Eating a regular diet {with-without:5700} difficulty. Bowel movements are {normal/abnormal***:19619}. {pain control:13522::"The patient is not having any pain."}  {Common ambulatory SmartLinks:19316}  Review of Systems {ros; complete:30496}   Objective:   LMP 04/04/2021  There is no height or weight on file to calculate BMI.  General:  alert and no distress  Abdomen: soft, bowel sounds active, non-tender  Incision:   {incision:13716::"no dehiscence","incision well approximated","healing well","no drainage","no erythema","no hernia","no seroma","no swelling"}    Pathology:    Assessment:   Patient s/p Robotic Total Laparoscopic Hysterectomy with Bilateral Salpingectomy (surgery)  {doing well:13525::"Doing well postoperatively."}   Plan:   1. Continue any current medications as instructed by provider. 2. Wound care discussed. 3. Operative findings again reviewed. Pathology report discussed. 4. Activity restrictions: {restrictions:13723} 5. Anticipated return to work: {work return:14002}. 6. Follow up: {9-14:78295} {time; units:18646} for ***    Rubie Maid, MD [facility]

## 2021-06-08 NOTE — Telephone Encounter (Signed)
Pt had apt today she rescheduled she was feeling bad and has gotten worse over the day, she is asking if she is able to work out now that she is 6 week post-op. Please advise.

## 2021-06-20 ENCOUNTER — Ambulatory Visit: Admit: 2021-06-20 | Payer: BC Managed Care – PPO | Admitting: Obstetrics and Gynecology

## 2021-06-20 SURGERY — LEEP (LOOP ELECTROSURGICAL EXCISION PROCEDURE)
Anesthesia: General

## 2021-06-21 ENCOUNTER — Encounter: Payer: Self-pay | Admitting: Obstetrics and Gynecology

## 2021-06-21 ENCOUNTER — Ambulatory Visit (INDEPENDENT_AMBULATORY_CARE_PROVIDER_SITE_OTHER): Payer: BC Managed Care – PPO | Admitting: Obstetrics and Gynecology

## 2021-06-21 ENCOUNTER — Encounter: Payer: Self-pay | Admitting: Nurse Practitioner

## 2021-06-21 VITALS — BP 103/63 | HR 81 | Resp 16 | Ht 69.5 in | Wt 300.7 lb

## 2021-06-21 DIAGNOSIS — Z09 Encounter for follow-up examination after completed treatment for conditions other than malignant neoplasm: Secondary | ICD-10-CM

## 2021-06-21 DIAGNOSIS — Z90710 Acquired absence of both cervix and uterus: Secondary | ICD-10-CM

## 2021-06-21 DIAGNOSIS — F419 Anxiety disorder, unspecified: Secondary | ICD-10-CM

## 2021-06-21 MED ORDER — TRAZODONE HCL 50 MG PO TABS: 50.0000 mg | ORAL_TABLET | Freq: Every evening | ORAL | 0 refills | Status: DC | PRN

## 2021-06-21 NOTE — Progress Notes (Signed)
    OBSTETRICS/GYNECOLOGY POST-OPERATIVE CLINIC VISIT  Subjective:     Erin Mcdonald is a 46 y.o. female who presents to the clinic 8 weeks status post Robotic Total Laparoscopic Hysterectomy with Bilateral Salpingectomy for management of menorrhagia, dysmenorrhea, iron deficiency anemia. Eating a regular diet without difficulty. Bowel movements are normal. The patient is not having any pain.  Patient reports that since the surgery she has noticed that she has had more anxiety and irritability lately.  Is having difficulties falling asleep due to mind racing.  She has a history of anxiety currently on Zoloft.  Also utilizes Xanax as needed for anxiety and notes that she has had to increase her usage lately.  The following portions of the patient's history were reviewed and updated as appropriate: allergies, current medications, past family history, past medical history, past social history, past surgical history, and problem list.  Review of Systems Pertinent items noted in HPI and remainder of comprehensive ROS otherwise negative.   Objective:   BP 103/63   Pulse 81   Resp 16   Ht 5' 9.5" (1.765 m)   Wt (!) 300 lb 11.2 oz (136.4 kg)   LMP 04/04/2021 Comment: Negative 04/25/2021  BMI 43.77 kg/m  Body mass index is 43.77 kg/m.  General:  alert and no distress  Abdomen: soft, bowel sounds active, non-tender  Incision:  Well-healed, well approximated.  Pelvis:  External genitalia appears normal.  Vagina with scant thin white discharge.  Vaginal cuff well-healed, no suture material present.  Bimanual exam not performed.    Pathology:  A.  UTERUS, CERVIX, BILATERAL FALLOPIAN TUBES; HYSTERECTOMY AND  BILATERAL SALPINGECTOMY.  -CERVIX WITH ACUTE AND CHRONIC CERVICITIS AND NO DYSPLASIA  -BENIGNENDOMETRIUM WITH NO ATYPIA  -UTERINE SEROSAL SURFACE ADHESIONS  -BENIGN UNREMARKABLE MYOMETRIUM  -BILATERAL FALLOPIAN TUBES WITH NO HISTOLOGIC ABNORMALITY     Assessment:   Patient s/p robotic  TLH with bilateral salpingectomy  Anxiety   Plan:   1.  Patient overall well-healed from surgery.  Can return to all routine activities. 2.  Patient has already returned to work. 3.  Discussed symptoms of anxiety after surgery.  Discussed that it is possible that they could be hormonally related or just may be residual effects after surgery and anesthesia exposure and of heightened her discussed options for management, can consider trial of trazodone.  Prescribed 30-day supply, patient to alert at end of trial if she desires prescription or is still symptomatic  7. Follow up:  4 to 6 months for final annual exam.  To follow-up sooner symptoms persist or worsen.    Rubie Maid, MD [facility]

## 2021-06-27 ENCOUNTER — Encounter: Payer: Self-pay | Admitting: Obstetrics and Gynecology

## 2021-06-28 ENCOUNTER — Ambulatory Visit: Payer: BC Managed Care – PPO | Admitting: Nurse Practitioner

## 2021-06-30 ENCOUNTER — Encounter: Payer: Self-pay | Admitting: Nurse Practitioner

## 2021-06-30 ENCOUNTER — Telehealth (INDEPENDENT_AMBULATORY_CARE_PROVIDER_SITE_OTHER): Payer: BC Managed Care – PPO | Admitting: Nurse Practitioner

## 2021-06-30 VITALS — Ht 69.5 in | Wt 300.0 lb

## 2021-06-30 DIAGNOSIS — B009 Herpesviral infection, unspecified: Secondary | ICD-10-CM

## 2021-06-30 DIAGNOSIS — K219 Gastro-esophageal reflux disease without esophagitis: Secondary | ICD-10-CM

## 2021-06-30 DIAGNOSIS — F411 Generalized anxiety disorder: Secondary | ICD-10-CM | POA: Diagnosis not present

## 2021-06-30 DIAGNOSIS — F321 Major depressive disorder, single episode, moderate: Secondary | ICD-10-CM | POA: Diagnosis not present

## 2021-06-30 DIAGNOSIS — J301 Allergic rhinitis due to pollen: Secondary | ICD-10-CM | POA: Diagnosis not present

## 2021-06-30 DIAGNOSIS — Z1211 Encounter for screening for malignant neoplasm of colon: Secondary | ICD-10-CM

## 2021-06-30 DIAGNOSIS — Z800 Family history of malignant neoplasm of digestive organs: Secondary | ICD-10-CM

## 2021-06-30 MED ORDER — DESLORATADINE 5 MG PO TABS: 5.0000 mg | ORAL_TABLET | Freq: Every day | ORAL | 1 refills | Status: DC

## 2021-06-30 MED ORDER — MONTELUKAST SODIUM 10 MG PO TABS: 10.0000 mg | ORAL_TABLET | Freq: Every day | ORAL | 1 refills | Status: DC

## 2021-06-30 MED ORDER — ALPRAZOLAM 1 MG PO TABS: 1.0000 mg | ORAL_TABLET | Freq: Three times a day (TID) | ORAL | 2 refills | Status: DC | PRN

## 2021-06-30 MED ORDER — TRAZODONE HCL 50 MG PO TABS: 50.0000 mg | ORAL_TABLET | Freq: Every evening | ORAL | 1 refills | Status: DC | PRN

## 2021-06-30 MED ORDER — VALACYCLOVIR HCL 1 G PO TABS: 1000.0000 mg | ORAL_TABLET | Freq: Every day | ORAL | 5 refills | Status: DC

## 2021-06-30 MED ORDER — AZELASTINE HCL 0.1 % NA SOLN: 1.0000 | Freq: Every day | NASAL | 1 refills | Status: DC

## 2021-06-30 MED ORDER — OMEPRAZOLE 20 MG PO CPDR: 20.0000 mg | DELAYED_RELEASE_CAPSULE | Freq: Two times a day (BID) | ORAL | 1 refills | Status: DC

## 2021-06-30 MED ORDER — ESCITALOPRAM OXALATE 20 MG PO TABS
ORAL_TABLET | ORAL | 1 refills | Status: DC
Start: 2021-06-30 — End: 2021-09-30

## 2021-06-30 NOTE — Progress Notes (Signed)
Virtual Visit via Telephone Note  I connected with Erin Mcdonald on 07/09/21 at 11:10 AM EDT by telephone and verified that I am speaking with the correct person using two identifiers.  Location: Patient: home Provider: [place] [facility]     I discussed the limitations, risks, security and privacy concerns of performing an evaluation and management service by telephone and the availability of in person appointments. I also discussed with the patient that there may be a patient responsible charge related to this service. The patient expressed understanding and agreed to proceed.   History of Present Illness: She is having significant problems with allergies. Feels congested with headache. Gets worse when going outside. Has been staying inside most of the time.  -persistent anxiety. Medications keep symptoms stable. She needs to have refills for her depression/anxiety medications today.  -needs to have screening colonoscopy.  -has seen GYN and had regular visit along with mammogram. Recently had hysterectomy. Has been recovering at home.    Observations/Objective:  The patient is alert and oriented. She is pleasant and answers all questions appropriately. Breathing is non-labored. She is in no acute distress at this time.  She is nasally congested.   Today's Vitals   06/30/21 1035  Weight: 300 lb (136.1 kg)  Height: 5' 9.5" (1.765 m)   Body mass index is 43.67 kg/m.   Assessment and Plan: 1. Seasonal allergic rhinitis due to pollen Start clarinex 5 mg daily. Add back singulair daily. Use astelin nasal spray daily.  - montelukast (SINGULAIR) 10 MG tablet; Take 1 tablet (10 mg total) by mouth at bedtime.  Dispense: 90 tablet; Refill: 1 - desloratadine (CLARINEX) 5 MG tablet; Take 1 tablet (5 mg total) by mouth daily.  Dispense: 90 tablet; Refill: 1 - azelastine (ASTELIN) 0.1 % nasal spray; Place 1 spray into both nostrils daily.  Dispense: 30 mL; Refill: 1  2.  Gastroesophageal reflux disease without esophagitis ,ay continue omeprazole 20 mg twice daily. Reffills provided today  - omeprazole (PRILOSEC) 20 MG capsule; Take 1 capsule (20 mg total) by mouth 2 (two) times daily.  Dispense: 180 capsule; Refill: 1  3. Depression, major, single episode, moderate ([place]) Continue lexapro as previously prescribed. Refills provided today  - escitalopram (LEXAPRO) 20 MG tablet; Take 1 tablet po QD  Dispense: 90 tablet; Refill: 1  4. GAD (generalized anxiety disorder) Continue trazodone 50 mg at bedtime as needed for insomnia. May take alprazolam 1 mg up to three times daily. Advised patient that she will need to be seen in the office to have additional refills for these medications. She voiced understanding and agreement.  - traZODone (DESYREL) 50 MG tablet; Take 1 tablet (50 mg total) by mouth at bedtime as needed for sleep.  Dispense: 90 tablet; Refill: 1 - ALPRAZolam (XANAX) 1 MG tablet; Take 1 tablet (1 mg total) by mouth 3 (three) times daily as needed.  Dispense: 90 tablet; Refill: 2  5. Herpes simplex disease Continue valacyclovir 1000 mg daily  - valACYclovir (VALTREX) 1000 MG tablet; Take 1 tablet (1,000 mg total) by mouth daily.  Dispense: 30 tablet; Refill: 5  6. Screening for colon cancer Refer to GI for screening colonoscopy  - Ambulatory referral to Gastroenterology  7. Family history of colon cancer in father Refer to GI for screening colonoscopy  - Ambulatory referral to Gastroenterology   Follow Up Instructions:    I discussed the assessment and treatment plan with the patient. The patient was provided an opportunity  to ask questions and all were answered. The patient agreed with the plan and demonstrated an understanding of the instructions.   The patient was advised to call back or seek an in-person evaluation if the symptoms worsen or if the condition fails to improve as anticipated.  I provided 20 minutes of non-face-to-face time  during this encounter.   [name], NP

## 2021-07-07 ENCOUNTER — Encounter: Payer: BC Managed Care – PPO | Admitting: Obstetrics and Gynecology

## 2021-07-13 ENCOUNTER — Encounter: Payer: Self-pay | Admitting: Nurse Practitioner

## 2021-07-14 ENCOUNTER — Other Ambulatory Visit: Payer: Self-pay | Admitting: Nurse Practitioner

## 2021-07-14 DIAGNOSIS — E538 Deficiency of other specified B group vitamins: Secondary | ICD-10-CM

## 2021-07-14 DIAGNOSIS — Z6841 Body Mass Index (BMI) 40.0 and over, adult: Secondary | ICD-10-CM

## 2021-07-14 DIAGNOSIS — R7301 Impaired fasting glucose: Secondary | ICD-10-CM

## 2021-07-14 MED ORDER — "BD INTEGRA SYRINGE 25G X 1"" 3 ML MISC": 5 refills | Status: DC

## 2021-07-14 MED ORDER — CYANOCOBALAMIN 1000 MCG/ML IJ SOLN: INTRAMUSCULAR | 1 refills | Status: DC

## 2021-07-25 ENCOUNTER — Other Ambulatory Visit: Payer: Self-pay | Admitting: Nurse Practitioner

## 2021-07-25 DIAGNOSIS — Z1211 Encounter for screening for malignant neoplasm of colon: Secondary | ICD-10-CM

## 2021-07-25 DIAGNOSIS — Z800 Family history of malignant neoplasm of digestive organs: Secondary | ICD-10-CM

## 2021-07-25 NOTE — Progress Notes (Signed)
Sent new referral to [facility] in [place]. She should hear from them in near future to schedule for new patient appointment

## 2021-07-26 ENCOUNTER — Encounter: Payer: Self-pay | Admitting: Nurse Practitioner

## 2021-07-29 ENCOUNTER — Telehealth: Payer: Self-pay

## 2021-07-29 MED ORDER — ESTROGENS CONJUGATED 0.625 MG PO TABS: 0.6250 mg | ORAL_TABLET | Freq: Every day | ORAL | 3 refills | Status: DC

## 2021-07-29 NOTE — Telephone Encounter (Signed)
LVM for pt to return my call.   Thanks,  Summer Mccolgan, CMA 

## 2021-08-01 ENCOUNTER — Telehealth: Payer: Self-pay | Admitting: Obstetrics and Gynecology

## 2021-08-01 MED ORDER — ESTROGENS CONJUGATED 0.625 MG PO TABS
0.6250 mg | ORAL_TABLET | Freq: Every day | ORAL | 3 refills | Status: DC
Start: 2021-08-01 — End: 2021-08-02

## 2021-08-01 NOTE — Telephone Encounter (Signed)
Called patient, Patient is aware RX has been sent.

## 2021-08-01 NOTE — Telephone Encounter (Signed)
Patient called and asked for her RX premarin to be resent to pharmacy. Pt states that she called pharmacy and pharmacy states the order has been cancelled. Please advise.

## 2021-08-01 NOTE — Telephone Encounter (Signed)
Please inform that prescription has been resubmitted to her pharmacy.   Dr. Marcelline Mates

## 2021-08-02 ENCOUNTER — Other Ambulatory Visit: Payer: Self-pay

## 2021-08-02 ENCOUNTER — Telehealth: Payer: Self-pay

## 2021-08-02 DIAGNOSIS — Z800 Family history of malignant neoplasm of digestive organs: Secondary | ICD-10-CM

## 2021-08-02 DIAGNOSIS — Z1211 Encounter for screening for malignant neoplasm of colon: Secondary | ICD-10-CM

## 2021-08-02 MED ORDER — ESTROGENS CONJUGATED 0.625 MG PO TABS: 0.6250 mg | ORAL_TABLET | Freq: Every day | ORAL | 3 refills | Status: DC

## 2021-08-02 MED ORDER — NA SULFATE-K SULFATE-MG SULF 17.5-3.13-1.6 GM/177ML PO SOLN: 1.0000 | Freq: Once | ORAL | 0 refills | Status: AC

## 2021-08-02 NOTE — Addendum Note (Signed)
Addended by: Augusto Gamble on: 08/02/2021 05:22 PM   Modules accepted: Orders

## 2021-08-02 NOTE — Telephone Encounter (Signed)
Gastroenterology Pre-Procedure Review  Request Date: 09/19/21 Requesting Physician: Dr. Vicente Males  PATIENT REVIEW QUESTIONS: The patient responded to the following health history questions as indicated:    1. Are you having any GI issues? no 2. Do you have a personal history of Polyps? no 3. Do you have a family history of Colon Cancer or Polyps? yes (father colon cancer) 4. Diabetes Mellitus? no 5. Joint replacements in the past 12 months?no 6. Major health problems in the past 3 months? Partial hysterectomy in April 2023 7. Any artificial heart valves, MVP, or defibrillator?no    MEDICATIONS & ALLERGIES:    Patient reports the following regarding taking any anticoagulation/antiplatelet therapy:   Plavix, Coumadin, Eliquis, Xarelto, Lovenox, Pradaxa, Brilinta, or Effient? no Aspirin? no  Patient confirms/reports the following medications:  Current Outpatient Medications  Medication Sig Dispense Refill   ALPRAZolam (XANAX) 1 MG tablet Take 1 tablet (1 mg total) by mouth 3 (three) times daily as needed. 90 tablet 2   azelastine (ASTELIN) 0.1 % nasal spray Place 1 spray into both nostrils daily. 30 mL 1   Cholecalciferol (VITAMIN D3 PO) Take by mouth.     cyanocobalamin (,VITAMIN B-12,) 1000 MCG/ML injection Inject 75m IM once weekly for 6 weeks then inject 133mIM monthly 10 mL 1   desloratadine (CLARINEX) 5 MG tablet Take 1 tablet (5 mg total) by mouth daily. 90 tablet 1   docusate sodium (COLACE) 100 MG capsule Take 1 capsule (100 mg total) by mouth 2 (two) times daily as needed for mild constipation. 30 capsule 2   escitalopram (LEXAPRO) 20 MG tablet Take 1 tablet po QD 90 tablet 1   estrogens, conjugated, (PREMARIN) 0.625 MG tablet Take 1 tablet (0.625 mg total) by mouth daily. 90 tablet 3   Ferrous Sulfate (IRON PO) Take by mouth.     ibuprofen (ADVIL) 800 MG tablet Take 1 tablet (800 mg total) by mouth every 8 (eight) hours as needed. 30 tablet 1   Insulin Pen Needle (PEN NEEDLES) 32G  X 5 MM MISC To use daily with dosage of saxenda 100 each 3   meclizine (ANTIVERT) 25 MG tablet Take 1 tablet (25 mg total) by mouth 3 (three) times daily as needed for dizziness. 30 tablet 0   montelukast (SINGULAIR) 10 MG tablet Take 1 tablet (10 mg total) by mouth at bedtime. 90 tablet 1   omeprazole (PRILOSEC) 20 MG capsule Take 1 capsule (20 mg total) by mouth 2 (two) times daily. 180 capsule 1   SAXENDA 18 MG/3ML SOPN Inject 3 mg into the skin daily. 3 mL 6   SYRINGE-NEEDLE, DISP, 3 ML (BD INTEGRA SYRINGE) 25G X 1" 3 ML MISC USE AS DIRECTED WITH B12 4 each 5   traZODone (DESYREL) 50 MG tablet Take 1 tablet (50 mg total) by mouth at bedtime as needed for sleep. 90 tablet 1   valACYclovir (VALTREX) 1000 MG tablet Take 1 tablet (1,000 mg total) by mouth daily. 30 tablet 5   No current facility-administered medications for this visit.    Patient confirms/reports the following allergies:  Allergies  Allergen Reactions   Codeine Other (See Comments)    Cause pt to be hyper    No orders of the defined types were placed in this encounter.   AUTHORIZATION INFORMATION Primary Insurance: 1D#: Group #:  Secondary Insurance: 1D#: Group #:  SCHEDULE INFORMATION: Date: 09/19/21 Time: Location: [facility]

## 2021-08-11 DIAGNOSIS — J019 Acute sinusitis, unspecified: Secondary | ICD-10-CM | POA: Diagnosis not present

## 2021-08-26 ENCOUNTER — Encounter: Payer: Self-pay | Admitting: Nurse Practitioner

## 2021-09-01 ENCOUNTER — Encounter: Payer: Self-pay | Admitting: Obstetrics and Gynecology

## 2021-09-19 ENCOUNTER — Encounter
Admission: RE | Disposition: A | Discharge status: Home / Self Care | Payer: Self-pay | Source: Home / Self Care | Attending: Gastroenterology

## 2021-09-19 ENCOUNTER — Encounter: Payer: Self-pay | Admitting: Gastroenterology

## 2021-09-19 ENCOUNTER — Ambulatory Visit
Admission: RE | Admit: 2021-09-19 | Discharge: 2021-09-19 | Disposition: A | Discharge status: Home / Self Care | Payer: BC Managed Care – PPO | Attending: Gastroenterology | Admitting: Gastroenterology

## 2021-09-19 ENCOUNTER — Ambulatory Visit: Payer: BC Managed Care – PPO | Admitting: Anesthesiology

## 2021-09-19 DIAGNOSIS — D649 Anemia, unspecified: Secondary | ICD-10-CM | POA: Insufficient documentation

## 2021-09-19 DIAGNOSIS — Z1211 Encounter for screening for malignant neoplasm of colon: Secondary | ICD-10-CM | POA: Diagnosis not present

## 2021-09-19 DIAGNOSIS — Z6841 Body Mass Index (BMI) 40.0 and over, adult: Secondary | ICD-10-CM | POA: Insufficient documentation

## 2021-09-19 DIAGNOSIS — G4730 Sleep apnea, unspecified: Secondary | ICD-10-CM | POA: Diagnosis not present

## 2021-09-19 DIAGNOSIS — F17210 Nicotine dependence, cigarettes, uncomplicated: Secondary | ICD-10-CM | POA: Insufficient documentation

## 2021-09-19 DIAGNOSIS — K635 Polyp of colon: Secondary | ICD-10-CM | POA: Diagnosis not present

## 2021-09-19 DIAGNOSIS — D125 Benign neoplasm of sigmoid colon: Secondary | ICD-10-CM

## 2021-09-19 DIAGNOSIS — K219 Gastro-esophageal reflux disease without esophagitis: Secondary | ICD-10-CM | POA: Insufficient documentation

## 2021-09-19 DIAGNOSIS — D126 Benign neoplasm of colon, unspecified: Secondary | ICD-10-CM | POA: Diagnosis not present

## 2021-09-19 DIAGNOSIS — F419 Anxiety disorder, unspecified: Secondary | ICD-10-CM | POA: Diagnosis not present

## 2021-09-19 DIAGNOSIS — F32A Depression, unspecified: Secondary | ICD-10-CM | POA: Diagnosis not present

## 2021-09-19 DIAGNOSIS — Z800 Family history of malignant neoplasm of digestive organs: Secondary | ICD-10-CM

## 2021-09-19 HISTORY — PX: COLONOSCOPY WITH PROPOFOL: SHX5780

## 2021-09-19 SURGERY — COLONOSCOPY WITH PROPOFOL
Anesthesia: General

## 2021-09-19 MED ORDER — LIDOCAINE HCL (CARDIAC) PF 100 MG/5ML IV SOSY
PREFILLED_SYRINGE | INTRAVENOUS | Status: DC | PRN
  Administered 2021-09-19: 50 mg via INTRAVENOUS

## 2021-09-19 MED ORDER — PROPOFOL 1000 MG/100ML IV EMUL
INTRAVENOUS | Status: AC
  Filled 2021-09-19: qty 100

## 2021-09-19 MED ORDER — DEXMEDETOMIDINE (PRECEDEX) IN NS 20 MCG/5ML (4 MCG/ML) IV SYRINGE
PREFILLED_SYRINGE | INTRAVENOUS | Status: DC | PRN
  Administered 2021-09-19: 8 ug via INTRAVENOUS

## 2021-09-19 MED ORDER — PROPOFOL 10 MG/ML IV BOLUS
INTRAVENOUS | Status: DC | PRN
  Administered 2021-09-19: 70 mg via INTRAVENOUS

## 2021-09-19 MED ORDER — MIDAZOLAM HCL 2 MG/2ML IJ SOLN
INTRAMUSCULAR | Status: AC
  Filled 2021-09-19: qty 2

## 2021-09-19 MED ORDER — SODIUM CHLORIDE 0.9 % IV SOLN: INTRAVENOUS | Status: DC

## 2021-09-19 MED ORDER — MIDAZOLAM HCL 2 MG/2ML IJ SOLN
INTRAMUSCULAR | Status: DC | PRN
  Administered 2021-09-19: 2 mg via INTRAVENOUS

## 2021-09-19 MED ORDER — PROPOFOL 500 MG/50ML IV EMUL
INTRAVENOUS | Status: DC | PRN
  Administered 2021-09-19: 150 ug/kg/min via INTRAVENOUS

## 2021-09-19 NOTE — Transfer of Care (Signed)
Immediate Anesthesia Transfer of Care Note  Patient: Erin Mcdonald  Procedure(s) Performed: COLONOSCOPY WITH PROPOFOL  Patient Location: PACU and Endoscopy Unit  Anesthesia Type:General  Level of Consciousness: drowsy and patient cooperative  Airway & Oxygen Therapy: Patient Spontanous Breathing  Post-op Assessment: Report given to RN and Post -op Vital signs reviewed and stable  Post vital signs: Reviewed and stable  Last Vitals:  Vitals Value Taken Time  BP 85/60 09/19/21 0959  Temp 36.4 C 09/19/21 0959  Pulse 77 09/19/21 0959  Resp 19 09/19/21 1001  SpO2 96 % 09/19/21 0959  Vitals shown include unvalidated device data.  Last Pain:  Vitals:   09/19/21 0959  TempSrc: Temporal  PainSc: Asleep         Complications: No notable events documented.

## 2021-09-19 NOTE — Op Note (Signed)
[facility] Gastroenterology Patient Name: Erin Mcdonald Procedure Date: 09/19/2021 9:33 AM MRN: [id number] Account #: [id number] Date of Birth: 03-26-1975 Admit Type: Outpatient Age: 46 Room: [facility] ENDO ROOM 1 Gender: Female Note Status: Finalized Instrument Name: Park Meo [id number] Procedure:             Colonoscopy Indications:           Colon cancer screening in patient at increased risk:                         Colorectal cancer in father Providers:             Jonathon Bellows MD, MD Referring MD:          Jonathon Bellows MD, MD (Referring MD), No Local Md, MD                         (Referring MD) Medicines:             Monitored Anesthesia Care Complications:         No immediate complications. Procedure:             Pre-Anesthesia Assessment:                        - Prior to the procedure, a History and Physical was                         performed, and patient medications, allergies and                         sensitivities were reviewed. The patient's tolerance                         of previous anesthesia was reviewed.                        - The risks and benefits of the procedure and the                         sedation options and risks were discussed with the                         patient. All questions were answered and informed                         consent was obtained.                        - ASA Grade Assessment: II - A patient with mild                         systemic disease.                        After obtaining informed consent, the colonoscope was                         passed under direct vision. Throughout the procedure,                         the  patient's blood pressure, pulse, and oxygen                         saturations were monitored continuously. The                         Colonoscope was introduced through the anus and                         advanced to the the cecum, identified by the                         appendiceal  orifice. The colonoscopy was performed                         with ease. The patient tolerated the procedure well.                         The quality of the bowel preparation was excellent. Findings:      The perianal and digital rectal examinations were normal.      A 5 mm polyp was found in the sigmoid colon. The polyp was sessile. The       polyp was removed with a jumbo cold forceps. Resection and retrieval       were complete.      The exam was otherwise without abnormality on direct and retroflexion       views. Impression:            - One 5 mm polyp in the sigmoid colon, removed with a                         jumbo cold forceps. Resected and retrieved.                        - The examination was otherwise normal on direct and                         retroflexion views. Recommendation:        - Discharge patient to home (with escort).                        - Resume previous diet.                        - Continue present medications.                        - Await pathology results.                        - Repeat colonoscopy in 5 years for surveillance. Procedure Code(s):     --- Professional ---                        [phone], Colonoscopy, flexible; with biopsy, single or                         multiple Diagnosis Code(s):     --- Professional ---  Z80.0, Family history of malignant neoplasm of                         digestive organs                        K63.5, Polyp of colon CPT copyright 2019 [employer] Association. All rights reserved. The codes documented in this report are preliminary and upon coder review may  be revised to meet current compliance requirements. Jonathon Bellows, MD Jonathon Bellows MD, MD 09/19/2021 9:57:00 AM This report has been signed electronically. Number of Addenda: 0 Note Initiated On: 09/19/2021 9:33 AM Scope Withdrawal Time: 0 hours 7 minutes 29 seconds  Total Procedure Duration: 0 hours 9 minutes 23 seconds  Estimated  Blood Loss:  Estimated blood loss: none.      [facility]

## 2021-09-19 NOTE — H&P (Signed)
Jonathon Bellows, MD [address], [place], [place], [place], [address] [id number] [place], [place], [place], [address] Phone: [phone]  Fax: [phone]  Primary Care Physician:  [name], NP   Pre-Procedure History & Physical: HPI:  Erin Mcdonald is a 46 y.o. female is here for an colonoscopy.   Past Medical History:  Diagnosis Date   Anxiety    Depression    Genital herpes    Sleep apnea     Past Surgical History:  Procedure Laterality Date   LAPAROSCOPIC GASTRIC SLEEVE RESECTION  11/15/2012   [facility], Dr Leonie Green Over[place]   ROBOTIC ASSISTED LAPAROSCOPIC HYSTERECTOMY AND SALPINGECTOMY Bilateral 04/25/2021   Procedure: XI ROBOTIC ASSISTED LAPAROSCOPIC HYSTERECTOMY AND SALPINGECTOMY;  Surgeon: Rubie Maid, MD;  Location: [facility] ORS;  Service: Gynecology;  Laterality: Bilateral;   TONSILLECTOMY  2013    Prior to Admission medications   Medication Sig Start Date End Date Taking? Authorizing Provider  ALPRAZolam Duanne Moron) 1 MG tablet Take 1 tablet (1 mg total) by mouth 3 (three) times daily as needed. 06/30/21  Yes Boscia, Greer Ee, NP  desloratadine (CLARINEX) 5 MG tablet Take 1 tablet (5 mg total) by mouth daily. 06/30/21  Yes [name], NP  escitalopram (LEXAPRO) 20 MG tablet Take 1 tablet po QD 06/30/21  Yes Boscia, Heather E, NP  montelukast (SINGULAIR) 10 MG tablet Take 1 tablet (10 mg total) by mouth at bedtime. 06/30/21  Yes Boscia, Greer Ee, NP  omeprazole (PRILOSEC) 20 MG capsule Take 1 capsule (20 mg total) by mouth 2 (two) times daily. 06/30/21  Yes [name], NP  traZODone (DESYREL) 50 MG tablet Take 1 tablet (50 mg total) by mouth at bedtime as needed for sleep. 06/30/21  Yes Boscia, Greer Ee, NP  valACYclovir (VALTREX) 1000 MG tablet Take 1 tablet (1,000 mg total) by mouth daily. 06/30/21  Yes Boscia, Heather E, NP  azelastine (ASTELIN) 0.1 % nasal spray Place 1 spray into both nostrils daily. 06/30/21   [name], NP  Cholecalciferol  (VITAMIN D3 PO) Take by mouth.    [provider]  cyanocobalamin (,VITAMIN B-12,) 1000 MCG/ML injection Inject 80m IM once weekly for 6 weeks then inject 194mIM monthly 07/14/21   Boscia, HeGreer EeNP  docusate sodium (COLACE) 100 MG capsule Take 1 capsule (100 mg total) by mouth 2 (two) times daily as needed for mild constipation. 04/25/21   ChRubie MaidMD  estrogens, conjugated, (PREMARIN) 0.625 MG tablet Take 1 tablet (0.625 mg total) by mouth daily. Patient not taking: Reported on 09/19/2021 08/02/21   ChRubie MaidMD  estrogens, conjugated, (PREMARIN) 0.625 MG tablet Take 1 tablet (0.625 mg total) by mouth daily. Patient not taking: Reported on 09/19/2021 08/02/21   ChRubie MaidMD  Ferrous Sulfate (IRON PO) Take by mouth.    [provider]  ibuprofen (ADVIL) 800 MG tablet Take 1 tablet (800 mg total) by mouth every 8 (eight) hours as needed. 04/25/21   ChRubie MaidMD  Insulin Pen Needle (PEN NEEDLES) 32G X 5 MM MISC To use daily with dosage of saxenda 04/27/21   BoRonnell FreshwaterNP  meclizine (ANTIVERT) 25 MG tablet Take 1 tablet (25 mg total) by mouth 3 (three) times daily as needed for dizziness. 03/18/21   BoRonnell FreshwaterNP  SAXENDA 18 MG/3ML SOPN Inject 3 mg into the skin daily. 04/25/21   ChRubie MaidMD  SYRINGE-NEEDLE, DISP, 3 ML (BD INTEGRA SYRINGE) 25G  X 1" 3 ML MISC USE AS DIRECTED WITH B12 07/14/21   [name], NP    Allergies as of 08/02/2021 - Review Complete 06/30/2021  Allergen Reaction Noted   Codeine Other (See Comments) 12/01/2015    Family History  Problem Relation Age of Onset   Heart attack Mother    Diabetes Paternal Grandmother    Diabetes Paternal Grandfather    Colon cancer Father 69   Breast cancer Neg Hx    Ovarian cancer Neg Hx     Social History   Socioeconomic History   Marital status: Single    Spouse name: Not on file   Number of children: Not on file   Years of education: Not on file   Highest education  level: Not on file  Occupational History   Not on file  Tobacco Use   Smoking status: Some Days    Packs/day: 1.00    Types: Cigarettes   Smokeless tobacco: Never   Tobacco comments:    0.5-1ppd 08/25/2020  Vaping Use   Vaping Use: Never used  Substance and Sexual Activity   Alcohol use: Yes    Comment: occasionally   Drug use: No   Sexual activity: Not Currently    Birth control/protection: None  Other Topics Concern   Not on file  Social History Narrative   Not on file   Social Determinants of Health   Financial Resource Strain: Not on file  Food Insecurity: Not on file  Transportation Needs: Not on file  Physical Activity: Not on file  Stress: Not on file  Social Connections: Not on file  Intimate Partner Violence: Not on file    Review of Systems: See HPI, otherwise negative ROS  Physical Exam: BP 129/82   Pulse 67   Temp (!) 96.9 F (36.1 C) (Temporal)   Resp 20   Ht 5' 9.5" (1.765 m)   Wt 131.5 kg   LMP 04/04/2021 Comment: Negative 04/25/2021  SpO2 97%   BMI 42.21 kg/m  General:   Alert,  pleasant and cooperative in NAD Head:  Normocephalic and atraumatic. Neck:  Supple; no masses or thyromegaly. Lungs:  Clear throughout to auscultation, normal respiratory effort.    Heart:  +S1, +S2, Regular rate and rhythm, No edema. Abdomen:  Soft, nontender and nondistended. Normal bowel sounds, without guarding, and without rebound.   Neurologic:  Alert and  oriented x4;  grossly normal neurologically.  Impression/Plan: Erin Mcdonald is here for an colonoscopy to be performed for Screening colonoscopy ,father had colon cancer Risks, benefits, limitations, and alternatives regarding  colonoscopy have been reviewed with the patient.  Questions have been answered.  All parties agreeable.   Jonathon Bellows, MD  09/19/2021, 9:31 AM

## 2021-09-19 NOTE — Anesthesia Postprocedure Evaluation (Signed)
Anesthesia Post Note  Patient: Erin Mcdonald  Procedure(s) Performed: COLONOSCOPY WITH PROPOFOL  Patient location during evaluation: Endoscopy Anesthesia Type: General Level of consciousness: awake and alert Pain management: pain level controlled Vital Signs Assessment: post-procedure vital signs reviewed and stable Respiratory status: spontaneous breathing, nonlabored ventilation and respiratory function stable Cardiovascular status: blood pressure returned to baseline and stable Postop Assessment: no apparent nausea or vomiting Anesthetic complications: no   No notable events documented.   Last Vitals:  Vitals:   09/19/21 1009 09/19/21 1019  BP: 124/77 116/72  Pulse: 68 75  Resp: 15 13  Temp:    SpO2: 97% 93%    Last Pain:  Vitals:   09/19/21 1019  TempSrc:   PainSc: 0-No pain                 [place] Ouch

## 2021-09-19 NOTE — Anesthesia Preprocedure Evaluation (Addendum)
Anesthesia Evaluation  Patient identified by MRN, date of birth, ID band Patient awake    Reviewed: Allergy & Precautions, NPO status , Patient's Chart, lab work & pertinent test results  History of Anesthesia Complications Negative for: history of anesthetic complications  Airway Mallampati: III   Neck ROM: Full    Dental no notable dental hx.    Pulmonary sleep apnea , Current Smoker and Patient abstained from smoking.,    Pulmonary exam normal breath sounds clear to auscultation       Cardiovascular Exercise Tolerance: Good negative cardio ROS Normal cardiovascular exam Rhythm:Regular Rate:Normal     Neuro/Psych PSYCHIATRIC DISORDERS Anxiety Depression negative neurological ROS     GI/Hepatic GERD  Medicated and Controlled,  Endo/Other  Morbid obesityClass 3 obesity  Renal/GU negative Renal ROS     Musculoskeletal   Abdominal (+) + obese,   Peds  Hematology  (+) Blood dyscrasia, anemia ,   Anesthesia Other Findings   Reproductive/Obstetrics                            Anesthesia Physical  Anesthesia Plan  ASA: 3  Anesthesia Plan: General   Post-op Pain Management: Minimal or no pain anticipated   Induction: Intravenous  PONV Risk Score and Plan: 2 and Propofol infusion and TIVA  Airway Management Planned: Natural Airway  Additional Equipment:   Intra-op Plan:   Post-operative Plan:   Informed Consent: I have reviewed the patients History and Physical, chart, labs and discussed the procedure including the risks, benefits and alternatives for the proposed anesthesia with the patient or authorized representative who has indicated his/her understanding and acceptance.     Dental advisory given  Plan Discussed with: CRNA  Anesthesia Plan Comments: (Patient consented for risks of anesthesia including but not limited to:  - adverse reactions to medications - damage to  eyes, teeth, lips or other oral mucosa - nerve damage due to positioning  - sore throat or hoarseness - damage to heart, brain, nerves, lungs, other parts of body or loss of life  Informed patient about role of CRNA in peri- and intra-operative care.  Patient voiced understanding.)       Anesthesia Quick Evaluation

## 2021-09-20 ENCOUNTER — Encounter: Payer: Self-pay | Admitting: Gastroenterology

## 2021-09-20 LAB — SURGICAL PATHOLOGY

## 2021-09-21 ENCOUNTER — Encounter: Payer: Self-pay | Admitting: Gastroenterology

## 2021-09-22 ENCOUNTER — Ambulatory Visit: Payer: BC Managed Care – PPO | Admitting: Nurse Practitioner

## 2021-09-29 ENCOUNTER — Telehealth: Payer: Self-pay

## 2021-09-29 ENCOUNTER — Encounter: Payer: Self-pay | Admitting: Nurse Practitioner

## 2021-09-29 NOTE — Telephone Encounter (Signed)
Do you mind calling her to ask what is going on? She is going to have to give some information. It will be impossible for me to call her today before we close. Thank you.

## 2021-09-29 NOTE — Telephone Encounter (Signed)
Patient called and would like a call before 5pm if possible, patient stated it was personal and did not want to discuss.

## 2021-09-29 NOTE — Telephone Encounter (Signed)
Pt stated that she had an colonoscopy last week and she is not producing bowel she would like for you to give her a call

## 2021-09-30 ENCOUNTER — Encounter: Payer: Self-pay | Admitting: Nurse Practitioner

## 2021-09-30 ENCOUNTER — Ambulatory Visit (INDEPENDENT_AMBULATORY_CARE_PROVIDER_SITE_OTHER): Payer: BC Managed Care – PPO | Admitting: Nurse Practitioner

## 2021-09-30 VITALS — Ht 69.5 in | Wt 287.0 lb

## 2021-09-30 DIAGNOSIS — B009 Herpesviral infection, unspecified: Secondary | ICD-10-CM

## 2021-09-30 DIAGNOSIS — F411 Generalized anxiety disorder: Secondary | ICD-10-CM | POA: Diagnosis not present

## 2021-09-30 DIAGNOSIS — J301 Allergic rhinitis due to pollen: Secondary | ICD-10-CM

## 2021-09-30 DIAGNOSIS — E538 Deficiency of other specified B group vitamins: Secondary | ICD-10-CM

## 2021-09-30 DIAGNOSIS — K219 Gastro-esophageal reflux disease without esophagitis: Secondary | ICD-10-CM

## 2021-09-30 DIAGNOSIS — Z6841 Body Mass Index (BMI) 40.0 and over, adult: Secondary | ICD-10-CM

## 2021-09-30 DIAGNOSIS — F321 Major depressive disorder, single episode, moderate: Secondary | ICD-10-CM | POA: Diagnosis not present

## 2021-09-30 DIAGNOSIS — K5900 Constipation, unspecified: Secondary | ICD-10-CM

## 2021-09-30 MED ORDER — MONTELUKAST SODIUM 10 MG PO TABS
10.0000 mg | ORAL_TABLET | Freq: Every day | ORAL | 1 refills | Status: DC
Start: 2021-09-30 — End: 2021-12-13

## 2021-09-30 MED ORDER — VALACYCLOVIR HCL 1 G PO TABS: 1000.0000 mg | ORAL_TABLET | Freq: Every day | ORAL | 5 refills | Status: DC

## 2021-09-30 MED ORDER — CYANOCOBALAMIN 1000 MCG/ML IJ SOLN: INTRAMUSCULAR | 1 refills | Status: DC

## 2021-09-30 MED ORDER — ALPRAZOLAM 1 MG PO TABS: 1.0000 mg | ORAL_TABLET | Freq: Three times a day (TID) | ORAL | 2 refills | Status: DC | PRN

## 2021-09-30 MED ORDER — TRAZODONE HCL 50 MG PO TABS: 50.0000 mg | ORAL_TABLET | Freq: Every evening | ORAL | 1 refills | Status: DC | PRN

## 2021-09-30 MED ORDER — AZELASTINE HCL 0.1 % NA SOLN: 1.0000 | Freq: Every day | NASAL | 5 refills | Status: DC

## 2021-09-30 MED ORDER — OMEPRAZOLE 20 MG PO CPDR
20.0000 mg | DELAYED_RELEASE_CAPSULE | Freq: Two times a day (BID) | ORAL | 1 refills | Status: DC
Start: 2021-09-30 — End: 2021-12-13

## 2021-09-30 MED ORDER — DESLORATADINE 5 MG PO TABS: 5.0000 mg | ORAL_TABLET | Freq: Every day | ORAL | 1 refills | Status: DC

## 2021-09-30 MED ORDER — ESCITALOPRAM OXALATE 20 MG PO TABS: ORAL_TABLET | ORAL | 1 refills | Status: DC

## 2021-09-30 NOTE — Progress Notes (Signed)
Virtual Visit via Telephone Note  I connected with Erin Mcdonald on 10/16/21 at  9:50 AM EDT by telephone and verified that I am speaking with the correct person using two identifiers.  Location: Patient: home  Provider: [place] [facility]     I discussed the limitations, risks, security and privacy concerns of performing an evaluation and management service by telephone and the availability of in person appointments. I also discussed with the patient that there may be a patient responsible charge related to this service. The patient expressed understanding and agreed to proceed.   History of Present Illness: Colonoscopy done 09/19/2021 - single 77m polyp was found and removed. Recommendation to repeat this in 5 years  -severe constipation and gas since coloscopy.  -cramping and bad gas  -has tried OTC stool softeners and laxatives with very little improvement.   Long history of depression and moderate to severe anxiety -lexapro daily  -a;prazolam up to three times daily as needed  -trazodone to help with sleep  -needs refills for these medications today.   Severe allergic rhinitis.  -takes clarijnex and singulair everyday  -uses astelin and flonase nasal sprays    Observations/Objective:   The patient is alert and oriented. She is pleasant and answers all questions appropriately. Breathing is non-labored. She is in no acute distress at this time.    Today's Vitals   09/30/21 1003  Weight: 287 lb (130.2 kg)  Height: 5' 9.5" (1.765 m)   Body mass index is 41.77 kg/m.   Assessment and Plan:  1. Constipation, unspecified constipation type Recommend OTC miralax daily. She should increase fiber and water intake daily. Consider using Fleet's enema if needed. Recommend she contact GI provider for further devaluation.   2. Seasonal allergic rhinitis due to pollen Trial clarinex 5 mg daily. Continue singulair daily. Use nasal sprays as prescribed.  -  azelastine (ASTELIN) 0.1 % nasal spray; Place 1 spray into both nostrils daily.  Dispense: 30 mL; Refill: 5 - desloratadine (CLARINEX) 5 MG tablet; Take 1 tablet (5 mg total) by mouth daily.  Dispense: 90 tablet; Refill: 1 - montelukast (SINGULAIR) 10 MG tablet; Take 1 tablet (10 mg total) by mouth at bedtime.  Dispense: 90 tablet; Refill: 1  3. Depression, major, single episode, moderate (HCC) Continue use of lexapro 20 mg daily  - escitalopram (LEXAPRO) 20 MG tablet; Take 1 tablet po QD  Dispense: 90 tablet; Refill: 1  4. GAD (generalized anxiety disorder) Take trazodone 50 mg at bedtime as needed for insomnia. May continue to take alprazolam 1 mg up to three times daily as needed for acute anxiety - ALPRAZolam (XANAX) 1 MG tablet; Take 1 tablet (1 mg total) by mouth 3 (three) times daily as needed.  Dispense: 90 tablet; Refill: 2 - traZODone (DESYREL) 50 MG tablet; Take 1 tablet (50 mg total) by mouth at bedtime as needed for sleep.  Dispense: 90 tablet; Refill: 1  5. Vitamin B12 deficiency Continue monthly b12 injections  - cyanocobalamin (VITAMIN B12) 1000 MCG/ML injection; Inject 1108mIM once weekly for 6 weeks then inject 55m46mM monthly  Dispense: 10 mL; Refill: 1  6. Gastroesophageal reflux disease without esophagitis Take prilosec 20 mg  twice daily  - omeprazole (PRILOSEC) 20 MG capsule; Take 1 capsule (20 mg total) by mouth 2 (two) times daily.  Dispense: 180 capsule; Refill: 1  7. Herpes simplex disease Take valtrex 1000 mg daily to  prevent fever blisters.  - valACYclovir (VALTREX) 1000  MG tablet; Take 1 tablet (1,000 mg total) by mouth daily.  Dispense: 30 tablet; Refill: 5  8. Body mass index (BMI) 40.0-44.9, adult (HCC) Discussed lowering calorie intake to 1500 calories per day and incorporating exercise into daily routine to help lose weight.    Follow Up Instructions:    I discussed the assessment and treatment plan with the patient. The patient was provided an  opportunity to ask questions and all were answered. The patient agreed with the plan and demonstrated an understanding of the instructions.   The patient was advised to call back or seek an in-person evaluation if the symptoms worsen or if the condition fails to improve as anticipated.  I provided 20 minutes of non-face-to-face time during this encounter.   [name], NP

## 2021-09-30 NOTE — Telephone Encounter (Signed)
Pt has a telehealth today 09/30/2021

## 2021-10-03 ENCOUNTER — Other Ambulatory Visit: Payer: Self-pay

## 2021-10-03 DIAGNOSIS — F321 Major depressive disorder, single episode, moderate: Secondary | ICD-10-CM

## 2021-10-03 MED ORDER — ESCITALOPRAM OXALATE 20 MG PO TABS: ORAL_TABLET | ORAL | 1 refills | Status: DC

## 2021-10-18 ENCOUNTER — Encounter: Payer: Self-pay | Admitting: Obstetrics and Gynecology

## 2021-10-19 ENCOUNTER — Encounter: Payer: BC Managed Care – PPO | Admitting: Obstetrics and Gynecology

## 2021-10-20 ENCOUNTER — Encounter: Payer: BC Managed Care – PPO | Admitting: Obstetrics and Gynecology

## 2021-11-03 ENCOUNTER — Encounter: Payer: Self-pay | Admitting: Nurse Practitioner

## 2021-11-03 DIAGNOSIS — J0100 Acute maxillary sinusitis, unspecified: Secondary | ICD-10-CM | POA: Diagnosis not present

## 2021-11-04 ENCOUNTER — Other Ambulatory Visit: Payer: Self-pay | Admitting: Nurse Practitioner

## 2021-11-04 DIAGNOSIS — J0141 Acute recurrent pansinusitis: Secondary | ICD-10-CM

## 2021-11-04 DIAGNOSIS — J069 Acute upper respiratory infection, unspecified: Secondary | ICD-10-CM

## 2021-11-04 DIAGNOSIS — B3731 Acute candidiasis of vulva and vagina: Secondary | ICD-10-CM

## 2021-11-04 MED ORDER — AZITHROMYCIN 250 MG PO TABS: ORAL_TABLET | ORAL | 0 refills | Status: DC

## 2021-11-04 MED ORDER — FLUCONAZOLE 150 MG PO TABS: ORAL_TABLET | ORAL | 0 refills | Status: DC

## 2021-11-04 NOTE — Progress Notes (Signed)
Due to sinus infection, I sent Zithromax. Take 2 tablets on day one then one tablet daily for 9 additional days. Diflucan sent in case yeast infection develops.

## 2021-12-06 ENCOUNTER — Encounter: Payer: Self-pay | Admitting: Nurse Practitioner

## 2021-12-13 ENCOUNTER — Ambulatory Visit: Payer: BC Managed Care – PPO | Admitting: Nurse Practitioner

## 2021-12-13 ENCOUNTER — Encounter: Payer: Self-pay | Admitting: Nurse Practitioner

## 2021-12-13 ENCOUNTER — Ambulatory Visit (INDEPENDENT_AMBULATORY_CARE_PROVIDER_SITE_OTHER): Payer: BC Managed Care – PPO | Admitting: Nurse Practitioner

## 2021-12-13 VITALS — Ht 69.5 in | Wt 287.0 lb

## 2021-12-13 DIAGNOSIS — F321 Major depressive disorder, single episode, moderate: Secondary | ICD-10-CM | POA: Diagnosis not present

## 2021-12-13 DIAGNOSIS — F411 Generalized anxiety disorder: Secondary | ICD-10-CM

## 2021-12-13 DIAGNOSIS — E538 Deficiency of other specified B group vitamins: Secondary | ICD-10-CM

## 2021-12-13 DIAGNOSIS — J301 Allergic rhinitis due to pollen: Secondary | ICD-10-CM

## 2021-12-13 DIAGNOSIS — J0141 Acute recurrent pansinusitis: Secondary | ICD-10-CM | POA: Diagnosis not present

## 2021-12-13 DIAGNOSIS — B3731 Acute candidiasis of vulva and vagina: Secondary | ICD-10-CM

## 2021-12-13 DIAGNOSIS — B009 Herpesviral infection, unspecified: Secondary | ICD-10-CM

## 2021-12-13 DIAGNOSIS — K219 Gastro-esophageal reflux disease without esophagitis: Secondary | ICD-10-CM

## 2021-12-13 MED ORDER — MONTELUKAST SODIUM 10 MG PO TABS: 10.0000 mg | ORAL_TABLET | Freq: Every day | ORAL | 1 refills | Status: DC

## 2021-12-13 MED ORDER — VALACYCLOVIR HCL 1 G PO TABS: 1000.0000 mg | ORAL_TABLET | Freq: Every day | ORAL | 5 refills | Status: DC

## 2021-12-13 MED ORDER — FLUCONAZOLE 150 MG PO TABS: ORAL_TABLET | ORAL | 0 refills | Status: DC

## 2021-12-13 MED ORDER — ESCITALOPRAM OXALATE 20 MG PO TABS: ORAL_TABLET | ORAL | 1 refills | Status: DC

## 2021-12-13 MED ORDER — OMEPRAZOLE 20 MG PO CPDR: 20.0000 mg | DELAYED_RELEASE_CAPSULE | Freq: Two times a day (BID) | ORAL | 1 refills | Status: DC

## 2021-12-13 MED ORDER — TRAZODONE HCL 50 MG PO TABS: 50.0000 mg | ORAL_TABLET | Freq: Every evening | ORAL | 1 refills | Status: DC | PRN

## 2021-12-13 MED ORDER — ALPRAZOLAM 1 MG PO TABS: 1.0000 mg | ORAL_TABLET | Freq: Three times a day (TID) | ORAL | 2 refills | Status: DC | PRN

## 2021-12-13 MED ORDER — CYANOCOBALAMIN 1000 MCG/ML IJ SOLN
INTRAMUSCULAR | 1 refills | Status: DC
Start: 2021-12-13 — End: 2022-03-07

## 2021-12-13 MED ORDER — CEFUROXIME AXETIL 500 MG PO TABS: 500.0000 mg | ORAL_TABLET | Freq: Two times a day (BID) | ORAL | 0 refills | Status: DC

## 2021-12-13 MED ORDER — DESLORATADINE 5 MG PO TABS
5.0000 mg | ORAL_TABLET | Freq: Every day | ORAL | 1 refills | Status: DC
Start: 2021-12-13 — End: 2022-03-07

## 2021-12-13 MED ORDER — "BD INTEGRA SYRINGE 25G X 1"" 3 ML MISC": 5 refills | Status: AC

## 2021-12-13 NOTE — Progress Notes (Signed)
Virtual Visit via Telephone Note  I connected with Erin Mcdonald on 12/13/21 at  2:50 PM EST by telephone and verified that I am speaking with the correct person using two identifiers.  Location: Patient: home Provider: [place] [facility]     I discussed the limitations, risks, security and privacy concerns of performing an evaluation and management service by telephone and the availability of in person appointments. I also discussed with the patient that there may be a patient responsible charge related to this service. The patient expressed understanding and agreed to proceed.   History of Present Illness: Had COVID a month ago -cannot get rid of coughing and congestion she has had since then  -Severe allergic rhinitis.  -takes clarijnex and singulair everyday  -uses astelin and flonase nasal sprays     Long history of depression and moderate to severe anxiety -lexapro daily - states that she has been taking 1.5 tablets since having her hysterectomy. Is about to run out of medication. Does not want to be completely out -alprazolam up to three times daily as needed  -trazodone to help with sleep  -needs refills for these medications today.       Observations/Objective: Today's Vitals   12/13/21 1403  Weight: 287 lb (130.2 kg)  Height: 5' 9.5" (1.765 m)   Body mass index is 41.77 kg/m.    The patient is alert and oriented. She is pleasant and answers all questions appropriately. Breathing is non-labored. She is in no acute distress at this time.  She is nasally congested   Assessment and Plan: 1. Acute recurrent pansinusitis Sinus infection after having COVID about 1 month ago. Start ceftin 500 mg twice daily. Rest and increase fluids. Continue using OTC medication to control symptoms.   - cefUROXime (CEFTIN) 500 MG tablet; Take 1 tablet (500 mg total) by mouth 2 (two) times daily with a meal.  Dispense: 20 tablet; Refill: 0  2. Seasonal allergic rhinitis  due to pollen Continue allergy medications as before.  - desloratadine (CLARINEX) 5 MG tablet; Take 1 tablet (5 mg total) by mouth daily.  Dispense: 90 tablet; Refill: 1 - montelukast (SINGULAIR) 10 MG tablet; Take 1 tablet (10 mg total) by mouth at bedtime.  Dispense: 90 tablet; Refill: 1  3. GAD (generalized anxiety disorder) Increase lexapro to 30 mg daily. Continue alprazolam 1 mg up to three times daily as needed and trazodone 50 mg at bedtime as needed for acute anxiety. Refills of both were sent to her pharmacy  - ALPRAZolam Duanne Moron) 1 MG tablet; Take 1 tablet (1 mg total) by mouth 3 (three) times daily as needed.  Dispense: 90 tablet; Refill: 2 - traZODone (DESYREL) 50 MG tablet; Take 1 tablet (50 mg total) by mouth at bedtime as needed for sleep.  Dispense: 90 tablet; Refill: 1  4. Depression, major, single episode, moderate (HCC) Increased dose lexapro to 30 mg. Explained to patient that this dose may not be covered by her insrance as it exceeds the recommended daily dose. She voices understanding. States she will contact me if not covered so I can send to different pharmacy and she will pay out of pocket.  - escitalopram (LEXAPRO) 20 MG tablet; Take 1.5 tablets po QD  Dispense: 135 tablet; Refill: 1  5. Gastroesophageal reflux disease without esophagitis Continue omeprazole 20 mg twice daily  - omeprazole (PRILOSEC) 20 MG capsule; Take 1 capsule (20 mg total) by mouth 2 (two) times daily.  Dispense: 180 capsule;  Refill: 1  6. Herpes simplex disease Take valacyclovir 1000 mg daily  - valACYclovir (VALTREX) 1000 MG tablet; Take 1 tablet (1,000 mg total) by mouth daily.  Dispense: 30 tablet; Refill: 5  7. Vaginal yeast infection Take diflucan as needed and as indicated if yeast infection develops from antibiotic use.  - fluconazole (DIFLUCAN) 150 MG tablet; Take 1 tablet po once. May repeat dose in 3 days as needed for persistent symptoms.  Dispense: 3 tablet; Refill: 0   Follow Up  Instructions:    I discussed the assessment and treatment plan with the patient. The patient was provided an opportunity to ask questions and all were answered. The patient agreed with the plan and demonstrated an understanding of the instructions.   The patient was advised to call back or seek an in-person evaluation if the symptoms worsen or if the condition fails to improve as anticipated.  I provided 25 minutes of non-face-to-face time during this encounter.   [name], NP

## 2021-12-19 ENCOUNTER — Encounter: Payer: Self-pay | Admitting: Nurse Practitioner

## 2021-12-19 ENCOUNTER — Other Ambulatory Visit: Payer: Self-pay | Admitting: Nurse Practitioner

## 2021-12-19 DIAGNOSIS — F321 Major depressive disorder, single episode, moderate: Secondary | ICD-10-CM

## 2021-12-19 DIAGNOSIS — F411 Generalized anxiety disorder: Secondary | ICD-10-CM

## 2021-12-19 MED ORDER — ESCITALOPRAM OXALATE 20 MG PO TABS: ORAL_TABLET | ORAL | 2 refills | Status: DC

## 2021-12-19 MED ORDER — ALPRAZOLAM 1 MG PO TABS: 1.0000 mg | ORAL_TABLET | Freq: Three times a day (TID) | ORAL | 0 refills | Status: DC | PRN

## 2022-01-19 ENCOUNTER — Encounter: Payer: Self-pay | Admitting: Nurse Practitioner

## 2022-01-19 DIAGNOSIS — E559 Vitamin D deficiency, unspecified: Secondary | ICD-10-CM

## 2022-01-19 DIAGNOSIS — Z0000 Encounter for general adult medical examination without abnormal findings: Secondary | ICD-10-CM

## 2022-01-19 DIAGNOSIS — R7303 Prediabetes: Secondary | ICD-10-CM

## 2022-01-19 DIAGNOSIS — E538 Deficiency of other specified B group vitamins: Secondary | ICD-10-CM

## 2022-01-19 DIAGNOSIS — D509 Iron deficiency anemia, unspecified: Secondary | ICD-10-CM

## 2022-01-27 ENCOUNTER — Other Ambulatory Visit
Admission: RE | Admit: 2022-01-27 | Discharge: 2022-01-27 | Disposition: A | Discharge status: Home / Self Care | Payer: No Typology Code available for payment source | Attending: Nurse Practitioner | Admitting: Nurse Practitioner

## 2022-01-27 DIAGNOSIS — E538 Deficiency of other specified B group vitamins: Secondary | ICD-10-CM

## 2022-01-27 DIAGNOSIS — R7303 Prediabetes: Secondary | ICD-10-CM | POA: Diagnosis present

## 2022-01-27 DIAGNOSIS — Z0000 Encounter for general adult medical examination without abnormal findings: Secondary | ICD-10-CM

## 2022-01-27 DIAGNOSIS — E559 Vitamin D deficiency, unspecified: Secondary | ICD-10-CM | POA: Diagnosis present

## 2022-01-27 DIAGNOSIS — D509 Iron deficiency anemia, unspecified: Secondary | ICD-10-CM

## 2022-01-27 LAB — CBC WITH DIFFERENTIAL/PLATELET
Abs Immature Granulocytes: 0.05 10*3/uL (ref 0.00–0.07)
Basophils Absolute: 0.1 10*3/uL (ref 0.0–0.1)
Basophils Relative: 1 %
Eosinophils Absolute: 0.6 10*3/uL — ABNORMAL HIGH (ref 0.0–0.5)
Eosinophils Relative: 6 %
HCT: 44.8 % (ref 36.0–46.0)
Hemoglobin: 14.8 g/dL (ref 12.0–15.0)
Immature Granulocytes: 1 %
Lymphocytes Relative: 18 %
Lymphs Abs: 2 10*3/uL (ref 0.7–4.0)
MCH: 31.2 pg (ref 26.0–34.0)
MCHC: 33 g/dL (ref 30.0–36.0)
MCV: 94.3 fL (ref 80.0–100.0)
Monocytes Absolute: 0.5 10*3/uL (ref 0.1–1.0)
Monocytes Relative: 5 %
Neutro Abs: 7.7 10*3/uL (ref 1.7–7.7)
Neutrophils Relative %: 69 %
Platelets: 324 10*3/uL (ref 150–400)
RBC: 4.75 MIL/uL (ref 3.87–5.11)
RDW: 13.4 % (ref 11.5–15.5)
WBC: 11 10*3/uL — ABNORMAL HIGH (ref 4.0–10.5)
nRBC: 0 % (ref 0.0–0.2)

## 2022-01-27 LAB — COMPREHENSIVE METABOLIC PANEL
ALT: 35 U/L (ref 0–44)
AST: 33 U/L (ref 15–41)
Albumin: 3.9 g/dL (ref 3.5–5.0)
Alkaline Phosphatase: 116 U/L (ref 38–126)
Anion gap: 8 (ref 5–15)
BUN: 12 mg/dL (ref 6–20)
CO2: 22 mmol/L (ref 22–32)
Calcium: 8.8 mg/dL — ABNORMAL LOW (ref 8.9–10.3)
Chloride: 103 mmol/L (ref 98–111)
Creatinine, Ser: 0.48 mg/dL (ref 0.44–1.00)
GFR, Estimated: >60 mL/min (ref >60)
Glucose, Bld: 120 mg/dL — ABNORMAL HIGH (ref 70–99)
Potassium: 3.9 mmol/L (ref 3.5–5.1)
Sodium: 133 mmol/L — ABNORMAL LOW (ref 135–145)
Total Bilirubin: 0.8 mg/dL (ref 0.3–1.2)
Total Protein: 7.3 g/dL (ref 6.5–8.1)

## 2022-01-27 LAB — HEMOGLOBIN A1C
Hgb A1c MFr Bld: 5.4 % (ref 4.8–5.6)
Mean Plasma Glucose: 108.28 mg/dL

## 2022-01-27 LAB — LIPID PANEL
Cholesterol: 280 mg/dL — ABNORMAL HIGH (ref 0–200)
HDL: 56 mg/dL (ref >40)
LDL Cholesterol: 202 mg/dL — ABNORMAL HIGH (ref 0–99)
Total CHOL/HDL Ratio: 5 RATIO
Triglycerides: 110 mg/dL (ref <150)
VLDL: 22 mg/dL (ref 0–40)

## 2022-01-27 LAB — TSH: TSH: 2.906 u[IU]/mL (ref 0.350–4.500)

## 2022-01-27 LAB — VITAMIN B12: Vitamin B-12: 400 pg/mL (ref 180–914)

## 2022-01-27 LAB — FOLATE: Folate: 3.6 ng/mL — ABNORMAL LOW (ref >5.9)

## 2022-01-27 NOTE — Addendum Note (Signed)
Addended by: Antonieta Iba C on: 01/27/2022 08:27 AM   Modules accepted: Orders

## 2022-01-27 NOTE — Addendum Note (Signed)
Addended by: Antonieta Iba C on: 01/27/2022 08:26 AM   Modules accepted: Orders

## 2022-01-27 NOTE — Progress Notes (Signed)
Waiting on all results. Folate, calcium, and sodium

## 2022-02-02 ENCOUNTER — Other Ambulatory Visit: Payer: Self-pay | Admitting: Nurse Practitioner

## 2022-02-02 DIAGNOSIS — Z6841 Body Mass Index (BMI) 40.0 and over, adult: Secondary | ICD-10-CM

## 2022-02-02 DIAGNOSIS — R7303 Prediabetes: Secondary | ICD-10-CM

## 2022-02-02 DIAGNOSIS — Z789 Other specified health status: Secondary | ICD-10-CM

## 2022-02-02 MED ORDER — ROSUVASTATIN CALCIUM 5 MG PO TABS: 5.0000 mg | ORAL_TABLET | Freq: Every day | ORAL | 3 refills | Status: DC

## 2022-02-02 MED ORDER — SEMAGLUTIDE(0.25 OR 0.5MG/DOS) 2 MG/3ML ~~LOC~~ SOPN: 0.2500 mg | PEN_INJECTOR | SUBCUTANEOUS | 2 refills | Status: DC

## 2022-02-07 LAB — VITAMIN D 1,25 DIHYDROXY
Vitamin D 1, 25 (OH)2 Total: 53 pg/mL
Vitamin D2 1, 25 (OH)2: <10 pg/mL
Vitamin D3 1, 25 (OH)2: 53 pg/mL

## 2022-03-03 ENCOUNTER — Encounter: Payer: Self-pay | Admitting: Nurse Practitioner

## 2022-03-06 NOTE — Telephone Encounter (Signed)
Ok thanks 

## 2022-03-07 ENCOUNTER — Encounter: Payer: Self-pay | Admitting: Nurse Practitioner

## 2022-03-07 ENCOUNTER — Telehealth (INDEPENDENT_AMBULATORY_CARE_PROVIDER_SITE_OTHER): Payer: No Typology Code available for payment source | Admitting: Nurse Practitioner

## 2022-03-07 DIAGNOSIS — Z6841 Body Mass Index (BMI) 40.0 and over, adult: Secondary | ICD-10-CM

## 2022-03-07 DIAGNOSIS — B009 Herpesviral infection, unspecified: Secondary | ICD-10-CM

## 2022-03-07 DIAGNOSIS — F411 Generalized anxiety disorder: Secondary | ICD-10-CM

## 2022-03-07 DIAGNOSIS — E538 Deficiency of other specified B group vitamins: Secondary | ICD-10-CM

## 2022-03-07 DIAGNOSIS — R7303 Prediabetes: Secondary | ICD-10-CM | POA: Diagnosis not present

## 2022-03-07 DIAGNOSIS — J301 Allergic rhinitis due to pollen: Secondary | ICD-10-CM | POA: Diagnosis not present

## 2022-03-07 DIAGNOSIS — K219 Gastro-esophageal reflux disease without esophagitis: Secondary | ICD-10-CM

## 2022-03-07 DIAGNOSIS — F321 Major depressive disorder, single episode, moderate: Secondary | ICD-10-CM

## 2022-03-07 DIAGNOSIS — Z789 Other specified health status: Secondary | ICD-10-CM

## 2022-03-07 MED ORDER — ALPRAZOLAM 1 MG PO TABS: 1.0000 mg | ORAL_TABLET | Freq: Three times a day (TID) | ORAL | 2 refills | Status: DC | PRN

## 2022-03-07 MED ORDER — SEMAGLUTIDE(0.25 OR 0.5MG/DOS) 2 MG/3ML ~~LOC~~ SOPN: 0.5000 mg | PEN_INJECTOR | SUBCUTANEOUS | 2 refills | Status: DC

## 2022-03-07 MED ORDER — AZELASTINE HCL 0.1 % NA SOLN: 1.0000 | Freq: Every day | NASAL | 5 refills | Status: AC

## 2022-03-07 MED ORDER — OMEPRAZOLE 20 MG PO CPDR: 20.0000 mg | DELAYED_RELEASE_CAPSULE | Freq: Two times a day (BID) | ORAL | 1 refills | Status: AC

## 2022-03-07 MED ORDER — DESLORATADINE 5 MG PO TABS: 5.0000 mg | ORAL_TABLET | Freq: Every day | ORAL | 1 refills | Status: AC

## 2022-03-07 MED ORDER — TRAZODONE HCL 50 MG PO TABS: 50.0000 mg | ORAL_TABLET | Freq: Every evening | ORAL | 1 refills | Status: AC | PRN

## 2022-03-07 MED ORDER — ESCITALOPRAM OXALATE 20 MG PO TABS: ORAL_TABLET | ORAL | 1 refills | Status: DC

## 2022-03-07 MED ORDER — MONTELUKAST SODIUM 10 MG PO TABS: 10.0000 mg | ORAL_TABLET | Freq: Every day | ORAL | 1 refills | Status: DC

## 2022-03-07 MED ORDER — CYANOCOBALAMIN 1000 MCG/ML IJ SOLN: INTRAMUSCULAR | 1 refills | Status: AC

## 2022-03-07 MED ORDER — PRAVASTATIN SODIUM 10 MG PO TABS: 10.0000 mg | ORAL_TABLET | Freq: Every day | ORAL | 1 refills | Status: AC

## 2022-03-07 MED ORDER — VALACYCLOVIR HCL 1 G PO TABS: 1000.0000 mg | ORAL_TABLET | Freq: Every day | ORAL | 1 refills | Status: DC

## 2022-03-07 NOTE — Progress Notes (Signed)
Established patient visit   Patient: Erin Mcdonald   DOB: [birth year]   47 y.o. Female  MRN: [id number] Visit Date: 03/07/2022  Virtual Visit via Telephone Note  I connected with Wendall Stade on 03/07/22 at  9:10 AM EST by telephone and verified that I am speaking with the correct person using two identifiers.  Location: Patient: home Provider: [place] [facility] at [facility]     I discussed the limitations, risks, security and privacy concerns of performing an evaluation and management service by telephone and the availability of in person appointments. I also discussed with the patient that there may be a patient responsible charge related to this service. The patient expressed understanding and agreed to proceed.       [name], NP  Chief Complaint  Patient presents with   Medication Refill   Subjective    Medication Refill    Follow up  Long history of depression and moderate to severe anxiety -lexapro daily - states that she has been taking 1.5 tablets since having her hysterectomy.  -is doing well with this dose and insurance approved the higher dose.  -alprazolam up to three times daily as needed  -trazodone to help with sleep  -needs refills for these medications today.  -taking ozempic 0.25 mg daily  Doing well with this and is ready to increase dose to 0.5 mg weekly   States that crestor made her feel poorly.  -muscle aches and generalized malaise.  -would like to try different cholesterol lowering medication.   -Severe allergic rhinitis.  -takes clarijnex and singulair everyday  -uses astelin and flonase nasal sprays   -she has no physical concerns or complaints today.  -she does need refills for her regular medication.    Medications: Outpatient Medications Prior to Visit  Medication Sig   Cholecalciferol (VITAMIN D3 PO) Take by mouth.   docusate sodium (COLACE) 100 MG capsule Take 1 capsule (100 mg total) by mouth 2 (two) times daily as  needed for mild constipation.   Ferrous Sulfate (IRON PO) Take by mouth.   fluconazole (DIFLUCAN) 150 MG tablet Take 1 tablet po once. May repeat dose in 3 days as needed for persistent symptoms.   ibuprofen (ADVIL) 800 MG tablet Take 1 tablet (800 mg total) by mouth every 8 (eight) hours as needed.   Insulin Pen Needle (PEN NEEDLES) 32G X 5 MM MISC To use daily with dosage of saxenda   meclizine (ANTIVERT) 25 MG tablet Take 1 tablet (25 mg total) by mouth 3 (three) times daily as needed for dizziness.   SYRINGE-NEEDLE, DISP, 3 ML (BD INTEGRA SYRINGE) 25G X 1" 3 ML MISC USE AS DIRECTED WITH B12   [DISCONTINUED] ALPRAZolam (XANAX) 1 MG tablet Take 1 tablet (1 mg total) by mouth 3 (three) times daily as needed.   [DISCONTINUED] azelastine (ASTELIN) 0.1 % nasal spray Place 1 spray into both nostrils daily.   [DISCONTINUED] cefUROXime (CEFTIN) 500 MG tablet Take 1 tablet (500 mg total) by mouth 2 (two) times daily with a meal.   [DISCONTINUED] cyanocobalamin (VITAMIN B12) 1000 MCG/ML injection Inject 48m IM once weekly for 6 weeks then inject 153mIM monthly   [DISCONTINUED] desloratadine (CLARINEX) 5 MG tablet Take 1 tablet (5 mg total) by mouth daily.   [DISCONTINUED] escitalopram (LEXAPRO) 20 MG tablet Take 1.5 tablets po QD   [DISCONTINUED] montelukast (SINGULAIR) 10 MG tablet Take 1 tablet (10 mg total) by mouth at bedtime.   [DISCONTINUED] omeprazole (PRILOSEC) 20 MG  capsule Take 1 capsule (20 mg total) by mouth 2 (two) times daily.   [DISCONTINUED] rosuvastatin (CRESTOR) 5 MG tablet Take 1 tablet (5 mg total) by mouth daily.   [DISCONTINUED] Semaglutide,0.25 or 0.'5MG'$ /DOS, 2 MG/3ML SOPN Inject 0.25 mg into the skin once a week.   [DISCONTINUED] traZODone (DESYREL) 50 MG tablet Take 1 tablet (50 mg total) by mouth at bedtime as needed for sleep.   [DISCONTINUED] valACYclovir (VALTREX) 1000 MG tablet Take 1 tablet (1,000 mg total) by mouth daily.   No facility-administered medications prior to  visit.    Review of Systems  All other systems reviewed and are negative.   Last CBC Lab Results  Component Value Date   WBC 11.0 (H) 01/27/2022   HGB 14.8 01/27/2022   HCT 44.8 01/27/2022   MCV 94.3 01/27/2022   MCH 31.2 01/27/2022   RDW 13.4 01/27/2022   PLT 324 [id number]   Last metabolic panel Lab Results  Component Value Date   GLUCOSE 120 (H) 01/27/2022   NA 133 (L) 01/27/2022   K 3.9 01/27/2022   CL 103 01/27/2022   CO2 22 01/27/2022   BUN 12 01/27/2022   CREATININE 0.48 01/27/2022   GFRNONAA >60 01/27/2022   CALCIUM 8.8 (L) 01/27/2022   PROT 7.3 01/27/2022   ALBUMIN 3.9 01/27/2022   BILITOT 0.8 01/27/2022   ALKPHOS 116 01/27/2022   AST 33 01/27/2022   ALT 35 01/27/2022   ANIONGAP 8 01/27/2022   Last lipids Lab Results  Component Value Date   CHOL 280 (H) 01/27/2022   HDL 56 01/27/2022   LDLCALC 202 (H) 01/27/2022   TRIG 110 01/27/2022   CHOLHDL 5.0 01/27/2022   Last hemoglobin A1c Lab Results  Component Value Date   HGBA1C 5.4 01/27/2022   Last thyroid functions Lab Results  Component Value Date   TSH 2.906 01/27/2022   Last vitamin D Lab Results  Component Value Date   VD25OH 49.9 09/24/2017   Last vitamin B12 and Folate Lab Results  Component Value Date   VITAMINB12 400 01/27/2022   FOLATE 3.6 (L) 01/27/2022       Objective     Today's Vitals   03/07/22 0955  Weight: 289 lb (131.1 kg)  Height: 5' 9.5" (1.765 m)   Body mass index is 42.07 kg/m.  BP Readings from Last 3 Encounters:  09/19/21 116/72  06/21/21 103/63  05/17/21 125/68    Wt Readings from Last 3 Encounters:  03/07/22 289 lb (131.1 kg)  12/13/21 287 lb (130.2 kg)  09/30/21 287 lb (130.2 kg)     The patient is alert and oriented. She is pleasant and answers all questions appropriately. Breathing is non-labored. She is in no acute distress at this time.      Assessment & Plan    1. Pre-diabetes Increase ozempic to 0.5 mg weekly. Reassess in 3 months.   - Semaglutide,0.25 or 0.'5MG'$ /DOS, 2 MG/3ML SOPN; Inject 0.5 mg into the skin once a week.  Dispense: 3 mL; Refill: 2  2. LDL-c greater than or equal to 100 mg/dl Discontinue crestor due to negative side effects. Start pravastatin 10 mg daily. Recheck fasting lipids in next 6 months and adjust medication as indicated.  - Semaglutide,0.25 or 0.'5MG'$ /DOS, 2 MG/3ML SOPN; Inject 0.5 mg into the skin once a week.  Dispense: 3 mL; Refill: 2 - pravastatin (PRAVACHOL) 10 MG tablet; Take 1 tablet (10 mg total) by mouth daily.  Dispense: 90 tablet; Refill: 1  3. Seasonal allergic rhinitis due  to pollen Continue clarinex and singulair daily. Use astelin nasal spray daily.  - azelastine (ASTELIN) 0.1 % nasal spray; Place 1 spray into both nostrils daily.  Dispense: 30 mL; Refill: 5 - desloratadine (CLARINEX) 5 MG tablet; Take 1 tablet (5 mg total) by mouth daily.  Dispense: 90 tablet; Refill: 1 - montelukast (SINGULAIR) 10 MG tablet; Take 1 tablet (10 mg total) by mouth at bedtime.  Dispense: 90 tablet; Refill: 1  4. Depression, major, single episode, moderate (HCC) Stable. Continue lexapro 30 mg daily. New prescription sent to her pharmacy today  - escitalopram (LEXAPRO) 20 MG tablet; Take 1.5 tablets po QD  Dispense: 135 tablet; Refill: 1  5. GAD (generalized anxiety disorder) May take alprazolam 1 mg up to three times daily if needed for acute anxiety. May take trazodone at bedtime if needed to help with insomnia caused by anxiety.  - ALPRAZolam (XANAX) 1 MG tablet; Take 1 tablet (1 mg total) by mouth 3 (three) times daily as needed.  Dispense: 90 tablet; Refill: 2 - traZODone (DESYREL) 50 MG tablet; Take 1 tablet (50 mg total) by mouth at bedtime as needed for sleep.  Dispense: 90 tablet; Refill: 1  6. Gastroesophageal reflux disease without esophagitis Continue prilosec 20 mg twice daily  - omeprazole (PRILOSEC) 20 MG capsule; Take 1 capsule (20 mg total) by mouth 2 (two) times daily.  Dispense: 180  capsule; Refill: 1  7. Vitamin B12 deficiency Monthly b12 injections may be continued  - cyanocobalamin (VITAMIN B12) 1000 MCG/ML injection; Inject 40m IM once weekly for 6 weeks then inject 155mIM monthly  Dispense: 10 mL; Refill: 1  8. Body mass index (BMI) 40.0-44.9, adult (HCC) Increase ozempic to 0.5 mg weekly. Continue with low calorie, high-protein diet. Incorporate exercise into daily activities.   - Semaglutide,0.25 or 0.'5MG'$ /DOS, 2 MG/3ML SOPN; Inject 0.5 mg into the skin once a week.  Dispense: 3 mL; Refill: 2  9. Herpes simplex disease May take valacyclovir 1000 mg daily. - valACYclovir (VALTREX) 1000 MG tablet; Take 1 tablet (1,000 mg total) by mouth daily.  Dispense: 90 tablet; Refill: 1  Problem List Items Addressed This Visit       Respiratory   Seasonal allergic rhinitis due to pollen   Relevant Medications   azelastine (ASTELIN) 0.1 % nasal spray   desloratadine (CLARINEX) 5 MG tablet   montelukast (SINGULAIR) 10 MG tablet     Digestive   Gastroesophageal reflux disease   Relevant Medications   omeprazole (PRILOSEC) 20 MG capsule     Other   Herpes simplex disease   Relevant Medications   valACYclovir (VALTREX) 1000 MG tablet   GAD (generalized anxiety disorder)   Relevant Medications   ALPRAZolam (XANAX) 1 MG tablet   escitalopram (LEXAPRO) 20 MG tablet   traZODone (DESYREL) 50 MG tablet   Depression, major, single episode, moderate (HCC)   Relevant Medications   ALPRAZolam (XANAX) 1 MG tablet   escitalopram (LEXAPRO) 20 MG tablet   traZODone (DESYREL) 50 MG tablet   Vitamin B12 deficiency   Relevant Medications   cyanocobalamin (VITAMIN B12) 1000 MCG/ML injection   Pre-diabetes   Relevant Medications   Semaglutide,0.25 or 0.'5MG'$ /DOS, 2 MG/3ML SOPN   Body mass index (BMI) 40.0-44.9, adult (HCC)   Relevant Medications   Semaglutide,0.25 or 0.'5MG'$ /DOS, 2 MG/3ML SOPN   LDL-c greater than or equal to 100 mg/dl   Relevant Medications   Semaglutide,0.25  or 0.'5MG'$ /DOS, 2 MG/3ML SOPN   pravastatin (PRAVACHOL) 10 MG tablet  Return in about 3 months (around 06/05/2022) for mood, med refills.     I discussed the assessment and treatment plan with the patient. The patient was provided an opportunity to ask questions and all were answered. The patient agreed with the plan and demonstrated an understanding of the instructions.   The patient was advised to call back or seek an in-person evaluation if the symptoms worsen or if the condition fails to improve as anticipated.  I provided 20 minutes of non-face-to-face time during this encounter.    [name], NP  [facility] at [facility] [phone] (phone) [phone] (fax)  [place]

## 2022-03-15 ENCOUNTER — Encounter (INDEPENDENT_AMBULATORY_CARE_PROVIDER_SITE_OTHER): Payer: No Typology Code available for payment source | Admitting: Nurse Practitioner

## 2022-03-15 DIAGNOSIS — F411 Generalized anxiety disorder: Secondary | ICD-10-CM

## 2022-03-16 MED ORDER — HYDROXYZINE PAMOATE 25 MG PO CAPS: 25.0000 mg | ORAL_CAPSULE | Freq: Four times a day (QID) | ORAL | 1 refills | Status: AC | PRN

## 2022-03-16 NOTE — Telephone Encounter (Signed)
Please see the MyChart message reply(ies) for my assessment and plan.    This patient gave consent for this Medical Advice Message and is aware that it may result in a bill to [employer], as well as the possibility of receiving a bill for a co-payment or deductible. They are an established patient, but are not seeking medical advice exclusively about a problem treated during an in person or video visit in the last seven days. I did not recommend an in person or video visit within seven days of my reply.    I spent a total of 10 minutes cumulative time within 7 days through [employer].  Velva Harman, PA

## 2022-03-16 NOTE — Addendum Note (Signed)
Addended by: Virgil Benedict on: 03/16/2022 10:44 AM   Modules accepted: Orders

## 2022-04-18 ENCOUNTER — Other Ambulatory Visit: Payer: Self-pay | Admitting: Nurse Practitioner

## 2022-04-18 DIAGNOSIS — Z1231 Encounter for screening mammogram for malignant neoplasm of breast: Secondary | ICD-10-CM

## 2022-05-03 ENCOUNTER — Other Ambulatory Visit: Payer: Self-pay | Admitting: Nurse Practitioner

## 2022-05-03 ENCOUNTER — Encounter: Payer: Self-pay | Admitting: Nurse Practitioner

## 2022-05-03 DIAGNOSIS — H60313 Diffuse otitis externa, bilateral: Secondary | ICD-10-CM

## 2022-05-03 DIAGNOSIS — M79605 Pain in left leg: Secondary | ICD-10-CM

## 2022-05-03 DIAGNOSIS — J0140 Acute pansinusitis, unspecified: Secondary | ICD-10-CM

## 2022-05-03 MED ORDER — OFLOXACIN 0.3 % OT SOLN: 5.0000 [drp] | Freq: Two times a day (BID) | OTIC | 0 refills | Status: DC

## 2022-05-03 MED ORDER — CEFUROXIME AXETIL 500 MG PO TABS: 500.0000 mg | ORAL_TABLET | Freq: Two times a day (BID) | ORAL | 0 refills | Status: DC

## 2022-05-12 ENCOUNTER — Ambulatory Visit
Admission: RE | Admit: 2022-05-12 | Discharge: 2022-05-12 | Disposition: A | Discharge status: Home / Self Care | Payer: No Typology Code available for payment source | Source: Ambulatory Visit | Attending: Family Medicine | Admitting: Family Medicine

## 2022-05-12 VITALS — BP 119/82 | HR 80 | Temp 98.5°F | Ht 69.0 in | Wt 285.0 lb

## 2022-05-12 DIAGNOSIS — M79605 Pain in left leg: Secondary | ICD-10-CM | POA: Diagnosis not present

## 2022-05-12 MED ORDER — NAPROXEN 500 MG PO TABS: 500.0000 mg | ORAL_TABLET | Freq: Two times a day (BID) | ORAL | 0 refills | Status: AC

## 2022-05-12 MED ORDER — METHOCARBAMOL 500 MG PO TABS: 500.0000 mg | ORAL_TABLET | Freq: Two times a day (BID) | ORAL | 0 refills | Status: AC

## 2022-05-12 NOTE — Discharge Instructions (Addendum)
I sent a muscle relaxer and an anti-inflammatory pain medicine to your pharmacy to see if this helps your pain.   Go to the medical mall to have an ultrasound performed. Look for signs at [facility] for the [facility]. Enter through the medical mall and go to the radiology department for your ultrasound.

## 2022-05-12 NOTE — ED Provider Notes (Signed)
[facility]    CSN: [id number] Arrival date & time: 05/12/22  1728      History   Chief Complaint Chief Complaint  Patient presents with   Leg Pain    HPI  HPI Erin Mcdonald is a 47 y.o. female.   Erin Mcdonald presents for left leg pain in the thigh area after waking up stairs yesterday.  She has a history of varicose veins. It feels like something is sitting on her nerve. She does not have history of blood clots. Took nothing for pain. She got worried and called her PCP who told her to go to the urgent care or ED. There is no swelling in the leg. She denies injury or trauma. Has some stiffness with walking.    Past Medical History:  Diagnosis Date   Anxiety    Depression    Genital herpes    Sleep apnea     Patient Active Problem List   Diagnosis Date Noted   LDL-c greater than or equal to 100 mg/dl 40/98/1191   Constipation 09/30/2021   Adenomatous polyp of sigmoid colon    Antibiotic-induced yeast infection 03/26/2021   Body mass index (BMI) 40.0-44.9, adult (HCC) 01/10/2021   Upper respiratory tract infection due to COVID-19 virus 01/10/2021   Pre-diabetes 08/31/2020   Acute recurrent pansinusitis 05/06/2020   Local superficial swelling, mass or lump 04/06/2020   Seasonal allergic rhinitis due to pollen 04/06/2020   Loud snoring 04/06/2020   Abnormal mammogram of right breast 01/22/2020   Vitamin B12 deficiency 02/26/2019   Urinary tract infection with hematuria 08/17/2018   Dysuria 08/17/2018   Other fatigue 09/10/2017   Vitamin D deficiency 09/10/2017   Impaired fasting glucose 09/10/2017   Abnormal weight gain 09/10/2017   Contusion of wrist 07/23/2017   OSA (obstructive sleep apnea) 07/23/2017   Family history of colon cancer in father 07/23/2017   Menorrhagia with regular cycle 07/23/2017   Atopic dermatitis 06/10/2017   Depression, major, single episode, moderate (HCC) 06/10/2017   Candidal vaginitis 02/21/2017   Major depressive disorder,  recurrent, moderate (HCC) 01/18/2017   GAD (generalized anxiety disorder) 01/18/2017   Gastroesophageal reflux disease 01/18/2017   Chronic venous insufficiency 06/14/2016   Bilateral lower extremity edema 04/12/2016   Varicose veins of bilateral lower extremities with pain 04/12/2016   Iron deficiency anemia 08/27/2014   Encounter for long-term (current) use of medications 08/15/2012   Herpes simplex disease 11/02/2008   OBESITY 04/14/2008   DEPRESSION 03/10/2008   WARTS, HAND 02/04/2008   ANXIETY 10/31/2007   SNORING 03/27/2007   URI 02/25/2007    Past Surgical History:  Procedure Laterality Date   COLONOSCOPY WITH PROPOFOL N/A 09/19/2021   Procedure: COLONOSCOPY WITH PROPOFOL;  Surgeon: Wyline Mood, MD;  Location: [facility] ENDOSCOPY;  Service: Gastroenterology;  Laterality: N/A;   LAPAROSCOPIC GASTRIC SLEEVE RESECTION  11/15/2012   [facility], Dr Piedad Climes [place]   ROBOTIC ASSISTED LAPAROSCOPIC HYSTERECTOMY AND SALPINGECTOMY Bilateral 04/25/2021   Procedure: XI ROBOTIC ASSISTED LAPAROSCOPIC HYSTERECTOMY AND SALPINGECTOMY;  Surgeon: Hildred Laser, MD;  Location: [facility] ORS;  Service: Gynecology;  Laterality: Bilateral;   TONSILLECTOMY  2013    OB History     Gravida  0   Para  0   Term  0   Preterm  0   AB  0   Living  0      SAB  0   IAB  0   Ectopic  0   Multiple  0   Live Births  0            Home Medications    Prior to Admission medications   Medication Sig Start Date End Date Taking? Authorizing Provider  ALPRAZolam Prudy Feeler) 1 MG tablet Take 1 tablet (1 mg total) by mouth 3 (three) times daily as needed. 03/07/22  Yes Boscia, Heather E, NP  azelastine (ASTELIN) 0.1 % nasal spray Place 1 spray into both nostrils daily. 03/07/22  Yes Carlean Jews, NP  cefUROXime (CEFTIN) 500 MG tablet Take 1 tablet (500 mg total) by mouth 2 (two) times daily with a meal. 05/03/22  Yes Boscia, Heather E, NP  Cholecalciferol (VITAMIN D3 PO) Take by mouth.   Yes [provider]  cyanocobalamin (VITAMIN B12) 1000 MCG/ML injection Inject 1ml IM once weekly for 6 weeks then inject 1ml IM monthly 03/07/22  Yes Boscia, Heather E, NP  desloratadine (CLARINEX) 5 MG tablet Take 1 tablet (5 mg total) by mouth daily. 03/07/22  Yes Boscia, Kathlynn Grate, NP  docusate sodium (COLACE) 100 MG capsule Take 1 capsule (100 mg total) by mouth 2 (two) times daily as needed for mild constipation. 04/25/21  Yes Hildred Laser, MD  escitalopram (LEXAPRO) 20 MG tablet Take 1.5 tablets po QD 03/07/22  Yes Boscia, Heather E, NP  Ferrous Sulfate (IRON PO) Take by mouth.   Yes [provider]  fluconazole (DIFLUCAN) 150 MG tablet Take 1 tablet po once. May repeat dose in 3 days as needed for persistent symptoms. 12/13/21  Yes Boscia, Kathlynn Grate, NP  hydrOXYzine (VISTARIL) 25 MG capsule Take 1 capsule (25 mg total) by mouth every 6 (six) hours as needed. (Can take 1 to 2 capsules up to 4 times a day) 03/16/22  Yes Saralyn Pilar A, PA  ibuprofen (ADVIL) 800 MG tablet Take 1 tablet (800 mg total) by mouth every 8 (eight) hours as needed. 04/25/21  Yes Hildred Laser, MD  Insulin Pen Needle (PEN NEEDLES) 32G X 5 MM MISC To use daily with dosage of saxenda 04/27/21  Yes Boscia, Kathlynn Grate, NP  meclizine (ANTIVERT) 25 MG tablet Take 1 tablet (25 mg total) by mouth 3 (three) times daily as needed for dizziness. 03/18/21  Yes Boscia, Kathlynn Grate, NP  methocarbamol (ROBAXIN) 500 MG tablet Take 1 tablet (500 mg total) by mouth 2 (two) times daily. 05/12/22  Yes Caramia Boutin, DO  montelukast (SINGULAIR) 10 MG tablet Take 1 tablet (10 mg total) by mouth at bedtime. 03/07/22  Yes Carlean Jews, NP  naproxen (NAPROSYN) 500 MG tablet Take 1 tablet (500 mg total) by mouth 2 (two) times daily with a meal. 05/12/22  Yes Candita Borenstein, DO  ofloxacin (FLOXIN) 0.3 % OTIC solution Place 5 drops into both ears 2 (two) times daily. 05/03/22  Yes Boscia, Kathlynn Grate, NP  omeprazole (PRILOSEC) 20 MG capsule Take 1  capsule (20 mg total) by mouth 2 (two) times daily. 03/07/22  Yes Boscia, Kathlynn Grate, NP  pravastatin (PRAVACHOL) 10 MG tablet Take 1 tablet (10 mg total) by mouth daily. 03/07/22  Yes Carlean Jews, NP  Semaglutide,0.25 or 0.5MG /DOS, 2 MG/3ML SOPN Inject 0.5 mg into the skin once a week. 03/07/22  Yes Boscia, Heather E, NP  SYRINGE-NEEDLE, DISP, 3 ML (BD INTEGRA SYRINGE) 25G X 1" 3 ML MISC USE AS DIRECTED WITH B12 12/13/21  Yes Boscia, Heather E, NP  traZODone (DESYREL) 50 MG tablet Take 1 tablet (50 mg total) by mouth at bedtime as needed for sleep. 03/07/22  Yes  Carlean Jews, NP  valACYclovir (VALTREX) 1000 MG tablet Take 1 tablet (1,000 mg total) by mouth daily. 03/07/22  Yes Carlean Jews, NP    Family History Family History  Problem Relation Age of Onset   Heart attack Mother    Diabetes Paternal Grandmother    Diabetes Paternal Grandfather    Colon cancer Father 63   Breast cancer Neg Hx    Ovarian cancer Neg Hx     Social History Social History   Tobacco Use   Smoking status: Some Days    Packs/day: 1    Types: Cigarettes   Smokeless tobacco: Never   Tobacco comments:    0.5-1ppd 08/25/2020  Vaping Use   Vaping Use: Never used  Substance Use Topics   Alcohol use: Yes    Comment: occasionally   Drug use: No     Allergies   Codeine   Review of Systems Review of Systems: :negative unless otherwise stated in HPI.      Physical Exam Triage Vital Signs ED Triage Vitals  Enc Vitals Group     BP 05/12/22 1744 119/82     Pulse Rate 05/12/22 1744 80     Resp --      Temp 05/12/22 1744 98.5 F (36.9 C)     Temp Source 05/12/22 1744 Oral     SpO2 05/12/22 1744 94 %     Weight 05/12/22 1742 285 lb (129.3 kg)     Height 05/12/22 1742 5\' 9"  (1.753 m)     Head Circumference --      Peak Flow --      Pain Score 05/12/22 1742 7     Pain Loc --      Pain Edu? --      Excl. in GC? --    No data found.  Updated Vital Signs BP 119/82 (BP Location: Left  Arm)   Pulse 80   Temp 98.5 F (36.9 C) (Oral)   Ht 5\' 9"  (1.753 m)   Wt 129.3 kg   LMP 04/04/2021 Comment: Negative 04/25/2021  SpO2 94%   BMI 42.09 kg/m   Visual Acuity Right Eye Distance:   Left Eye Distance:   Bilateral Distance:    Right Eye Near:   Left Eye Near:    Bilateral Near:     Physical Exam GEN: well appearing female in no acute distress  CVS: well perfused, strong pulses bilaterally RESP: speaking in full sentences without pause, no respiratory distress  MSK: Normal range of motion of hip and knee, no evidence of bony deformity, asymmetry or muscle atrophy, tenderness to palpation anterior thigh, no overlying skin changes, edema no evidence of bony deformity, asymmetry, or muscle atrophy. Sensation intact. Peripheral pulses intact.   UC Treatments / Results  Labs (all labs ordered are listed, but only abnormal results are displayed) Labs Reviewed - No data to display  EKG   Radiology US Venous Img Lower Unilateral Left (DVT)  Result Date: 05/18/2022 CLINICAL DATA:  Left lower extremity pain rule out DVT. EXAM: Left LOWER EXTREMITY VENOUS DOPPLER ULTRASOUND TECHNIQUE: Gray-scale sonography with compression, as well as color and duplex ultrasound, were performed to evaluate the deep venous system(s) from the level of the common femoral vein through the popliteal and proximal calf veins. COMPARISON:  None Available. FINDINGS: VENOUS Normal compressibility of the common femoral, superficial femoral, and popliteal veins, as well as the visualized calf veins. Visualized portions of profunda femoral vein and great saphenous vein unremarkable.  No filling defects to suggest DVT on grayscale or color Doppler imaging. Doppler waveforms show normal direction of venous flow, normal respiratory plasticity and response to augmentation. Limited views of the contralateral common femoral vein are unremarkable. OTHER None. Limitations: none IMPRESSION: Negative. Electronically  Signed   By: Signa Kell M.D.   On: 05/18/2022 10:55       Procedures Procedures (including critical care time)  Medications Ordered in UC Medications - No data to display  Initial Impression / Assessment and Plan / UC Course  I have reviewed the triage vital signs and the nursing notes.  Pertinent labs & imaging results that were available during my care of the patient were reviewed by me and considered in my medical decision making (see chart for details).      Pt is a 47 y.o.  female with acute onset left thigh pain after walking yesterday.  She has history of varicose veins and she and apparently her PCP were concerned about possible DVT therefore she was referred for urgent care.  Overall, patient well-appearing with tenderness around her anterior left thigh.  There is no edema or overlying skin changes to suggest a bacterial cause.  She has normal range of motion and therefore I doubt a fracture or hip dislocation.  Patient most concerned that she may have a blood clot.  Obtained stat ultrasound of her left lower extremity to rule out an acute VTE.  Patient will travel to [facility] medical mall to get her ultrasound today.  In the meantime, most likely source of her pain is muscular strain due to increased exercise.  Patient to gradually return to normal activities, as tolerated and continue ordinary activities within the limits permitted by pain. Prescribed Naproxen sodium  and muscle relaxer  for pain relief.  Tylenol PRN. Advised patient to avoid OTC NSAIDs while taking prescription NSAID. Counseled patient on red flag symptoms and when to seek immediate care.  Patient to follow up with orthopedic provider, if symptoms do not improve with conservative treatment.  Return and ED precautions given. Understanding voiced. Discussed MDM, treatment plan and plan for follow-up with patient who agrees with plan.   Ultrasound is negative.  She does not have an acute  blood clot.  Final Clinical Impressions(s) / UC Diagnoses   Final diagnoses:  Left leg pain     Discharge Instructions      I sent a muscle relaxer and an anti-inflammatory pain medicine to your pharmacy to see if this helps your pain.   Go to the medical mall to have an ultrasound performed. Look for signs at [facility] for the [facility]. Enter through the medical mall and go to the radiology department for your ultrasound.       ED Prescriptions     Medication Sig Dispense Auth. Provider   methocarbamol (ROBAXIN) 500 MG tablet Take 1 tablet (500 mg total) by mouth 2 (two) times daily. 20 tablet Codey Burling, DO   naproxen (NAPROSYN) 500 MG tablet Take 1 tablet (500 mg total) by mouth 2 (two) times daily with a meal. 30 tablet Keryl Gholson, DO      PDMP not reviewed this encounter.   Katha Cabal, DO 05/19/22 [phone]

## 2022-05-12 NOTE — ED Triage Notes (Signed)
Pt c/o left leg thigh pain that shoots down her leg x2days  Pt is worried about a possible blood clot due to varicose veins.   Pt recently started cefuroxime for a sinus infection.

## 2022-05-15 ENCOUNTER — Ambulatory Visit
Admission: RE | Admit: 2022-05-15 | Discharge: 2022-05-15 | Disposition: A | Discharge status: Home / Self Care | Payer: 59 | Source: Ambulatory Visit | Attending: Nurse Practitioner | Admitting: Nurse Practitioner

## 2022-05-15 DIAGNOSIS — Z1231 Encounter for screening mammogram for malignant neoplasm of breast: Secondary | ICD-10-CM | POA: Diagnosis not present

## 2022-05-17 ENCOUNTER — Other Ambulatory Visit: Payer: Self-pay | Admitting: Family Medicine

## 2022-05-17 DIAGNOSIS — I872 Venous insufficiency (chronic) (peripheral): Secondary | ICD-10-CM

## 2022-05-17 DIAGNOSIS — I83813 Varicose veins of bilateral lower extremities with pain: Secondary | ICD-10-CM

## 2022-05-17 NOTE — Addendum Note (Signed)
Addended by: Saralyn Pilar on: 05/17/2022 12:30 PM   Modules accepted: Orders

## 2022-05-17 NOTE — Progress Notes (Signed)
Patient has a history of varicose veins and chronic venous insufficiency.  She has not seen a vascular specialist in many years now.  Most recently, she has been complaining of left leg pain.  Ultrasound was ordered to rule out DVT but I believe she would also benefit from seeing a vascular surgeon to address her chronic conditions progression.

## 2022-05-18 ENCOUNTER — Ambulatory Visit
Admission: RE | Admit: 2022-05-18 | Discharge: 2022-05-18 | Disposition: A | Discharge status: Home / Self Care | Payer: 59 | Source: Ambulatory Visit | Attending: Family Medicine | Admitting: Family Medicine

## 2022-05-18 DIAGNOSIS — M79605 Pain in left leg: Secondary | ICD-10-CM | POA: Diagnosis present

## 2022-05-23 ENCOUNTER — Other Ambulatory Visit: Payer: Self-pay

## 2022-05-23 DIAGNOSIS — Z6841 Body Mass Index (BMI) 40.0 and over, adult: Secondary | ICD-10-CM

## 2022-05-23 DIAGNOSIS — R7303 Prediabetes: Secondary | ICD-10-CM

## 2022-05-23 DIAGNOSIS — Z789 Other specified health status: Secondary | ICD-10-CM

## 2022-05-23 MED ORDER — SEMAGLUTIDE(0.25 OR 0.5MG/DOS) 2 MG/3ML ~~LOC~~ SOPN: 0.5000 mg | PEN_INJECTOR | SUBCUTANEOUS | 2 refills | Status: DC

## 2022-05-24 ENCOUNTER — Encounter: Payer: Self-pay | Admitting: Nurse Practitioner

## 2022-05-24 NOTE — Progress Notes (Signed)
Negative mammogram. Repeat in one year.

## 2022-06-12 ENCOUNTER — Telehealth: Payer: Self-pay | Admitting: *Deleted

## 2022-06-12 ENCOUNTER — Encounter: Payer: Self-pay | Admitting: Nurse Practitioner

## 2022-06-12 DIAGNOSIS — F321 Major depressive disorder, single episode, moderate: Secondary | ICD-10-CM

## 2022-06-12 DIAGNOSIS — B009 Herpesviral infection, unspecified: Secondary | ICD-10-CM

## 2022-06-12 NOTE — Telephone Encounter (Signed)
Pt calling to say that she has an appointment on 06/16/22 and states that she is having to go out of town and can do this appt virtual.  Informed her that her last visit was virtual and that her next visit was needing to be in person since they are supposed to be every other, she wanted me to check with PCP and if it can't be done virtually then we  will reschedule her in person.  Please advise.

## 2022-06-13 ENCOUNTER — Other Ambulatory Visit: Payer: Self-pay | Admitting: Nurse Practitioner

## 2022-06-13 DIAGNOSIS — F411 Generalized anxiety disorder: Secondary | ICD-10-CM

## 2022-06-13 DIAGNOSIS — J301 Allergic rhinitis due to pollen: Secondary | ICD-10-CM

## 2022-06-13 MED ORDER — VALACYCLOVIR HCL 1 G PO TABS: 1000.0000 mg | ORAL_TABLET | Freq: Every day | ORAL | 1 refills | Status: AC

## 2022-06-13 MED ORDER — MONTELUKAST SODIUM 10 MG PO TABS: 10.0000 mg | ORAL_TABLET | Freq: Every day | ORAL | 1 refills | Status: AC

## 2022-06-13 MED ORDER — ESCITALOPRAM OXALATE 20 MG PO TABS: ORAL_TABLET | ORAL | 0 refills | Status: AC

## 2022-06-13 MED ORDER — ALPRAZOLAM 1 MG PO TABS: 1.0000 mg | ORAL_TABLET | Freq: Three times a day (TID) | ORAL | 2 refills | Status: AC | PRN

## 2022-06-13 MED ORDER — WEGOVY 0.5 MG/0.5ML ~~LOC~~ SOAJ: 0.5000 mg | SUBCUTANEOUS | 1 refills | Status: DC

## 2022-06-13 NOTE — Addendum Note (Signed)
Addended by: Tonny Bollman on: 06/13/2022 09:02 AM   Modules accepted: Orders

## 2022-06-14 ENCOUNTER — Encounter: Payer: Self-pay | Admitting: Nurse Practitioner

## 2022-06-15 ENCOUNTER — Other Ambulatory Visit: Payer: Self-pay | Admitting: Nurse Practitioner

## 2022-06-15 DIAGNOSIS — R7303 Prediabetes: Secondary | ICD-10-CM

## 2022-06-15 MED ORDER — WEGOVY 0.25 MG/0.5ML ~~LOC~~ SOAJ: 0.2500 mg | SUBCUTANEOUS | 1 refills | Status: DC

## 2022-06-16 ENCOUNTER — Ambulatory Visit: Payer: 59 | Admitting: Nurse Practitioner

## 2022-06-29 NOTE — Telephone Encounter (Signed)
Is there a way to get the PA forms again and I will try one last time to resubmit?

## 2022-06-29 NOTE — Telephone Encounter (Signed)
A new Rx has to be resent

## 2022-07-03 ENCOUNTER — Other Ambulatory Visit: Payer: Self-pay | Admitting: Nurse Practitioner

## 2022-07-03 DIAGNOSIS — R7303 Prediabetes: Secondary | ICD-10-CM

## 2022-07-03 MED ORDER — WEGOVY 0.25 MG/0.5ML ~~LOC~~ SOAJ: 0.2500 mg | SUBCUTANEOUS | 1 refills | Status: AC

## 2022-08-02 ENCOUNTER — Encounter: Payer: Self-pay | Admitting: Nurse Practitioner

## 2022-08-02 DIAGNOSIS — J301 Allergic rhinitis due to pollen: Secondary | ICD-10-CM

## 2022-08-04 NOTE — Telephone Encounter (Signed)
Hi.  Her messages came to me.  Have a wonderful weekend.  Herbert Seta

## 2022-08-21 ENCOUNTER — Encounter: Payer: Self-pay | Admitting: Nurse Practitioner

## 2022-08-29 ENCOUNTER — Encounter (INDEPENDENT_AMBULATORY_CARE_PROVIDER_SITE_OTHER): Payer: BC Managed Care – PPO | Admitting: Vascular Surgery

## 2022-08-29 ENCOUNTER — Encounter (INDEPENDENT_AMBULATORY_CARE_PROVIDER_SITE_OTHER): Payer: BC Managed Care – PPO

## 2022-09-22 ENCOUNTER — Ambulatory Visit: Payer: 59 | Admitting: Family Medicine

## 2022-09-22 NOTE — Progress Notes (Deleted)
Established Patient Office Visit  Subjective   Patient ID: Erin Mcdonald, female    DOB: 04/05/1975  Age: 47 y.o. MRN: [id number]  No chief complaint on file.   HPI  Anxiety-Lexapro, Xanax.  Lexapro 30 mg.  Lorazepam 1 mg 3 times daily.  Trazodone 50 mg at bedtime   The 10-year ASCVD risk score (Arnett DK, et al., 2019) is: 4.2%  Health Maintenance Due  Topic Date Due   INFLUENZA VACCINE  08/10/2022   COVID-19 Vaccine (4 - 2023-24 season) 09/10/2022      Objective:     LMP 04/04/2021 Comment: Negative 04/25/2021 {Vitals History (Optional):23777}  Physical Exam   No results found for any visits on 09/22/22.      Assessment & Plan:   There are no diagnoses linked to this encounter.   No follow-ups on file.    Sandre Kitty, MD

## 2022-09-28 ENCOUNTER — Other Ambulatory Visit: Payer: Self-pay | Admitting: Family Medicine

## 2023-01-10 IMAGING — US US BREAST*R* LIMITED INC AXILLA
1 series · 6 of 6 positions shown · non-contrast
Comparison: Previous exam(s).

CLINICAL DATA: 44-year-old female for 2 year follow-up of RIGHT
breast mass and for annual bilateral mammogram.

EXAM:
DIGITAL DIAGNOSTIC BILATERAL MAMMOGRAM WITH CAD AND TOMOSYNTHESIS
ULTRASOUND RIGHT BREAST
TECHNIQUE: Bilateral digital diagnostic mammography and breast tomosynthesis
was performed. Digital images of the breasts were evaluated with
computer-aided detection. Targeted ultrasound examination of the
Right breast was performed.

[Series 1: us breast*right* limited inc axilla · 0.05mm/px · 6 of 6 slices shown]
[im 1/6]
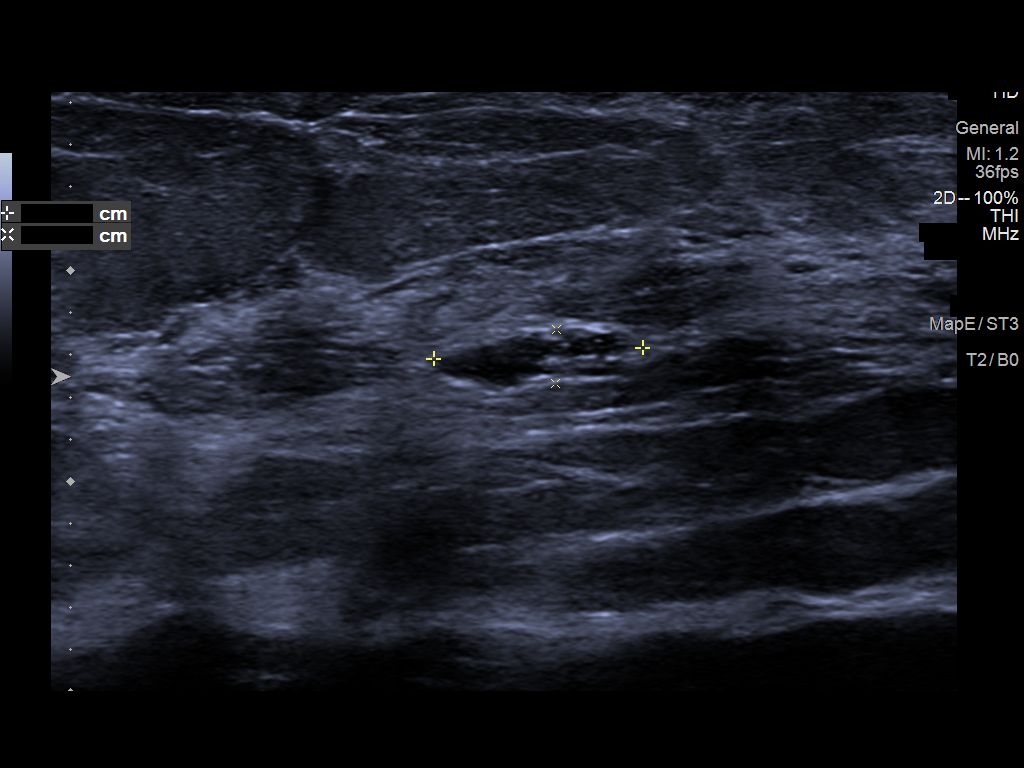
[im 2/6]
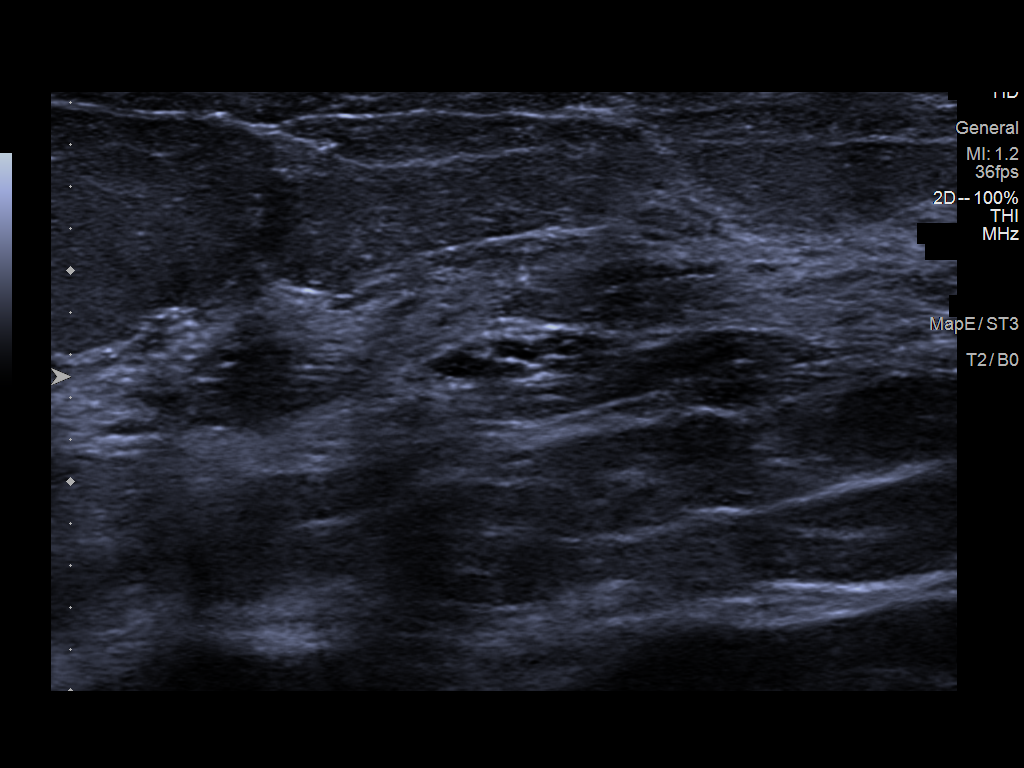
[im 3/6]
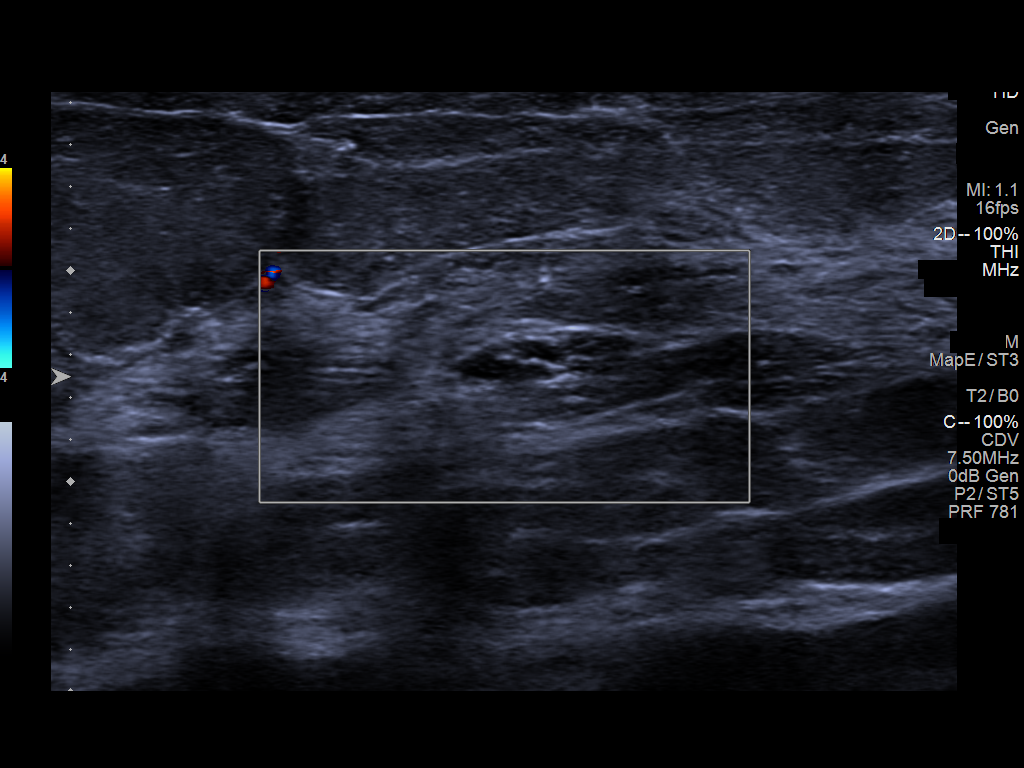
[im 4/6]
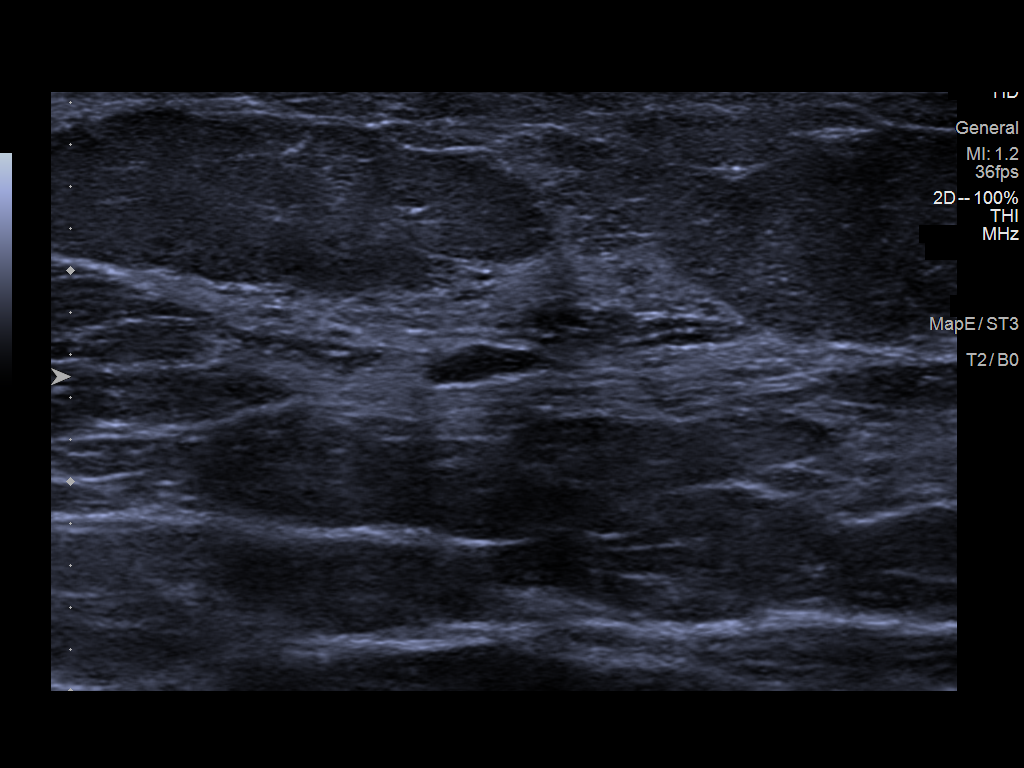
[im 5/6]
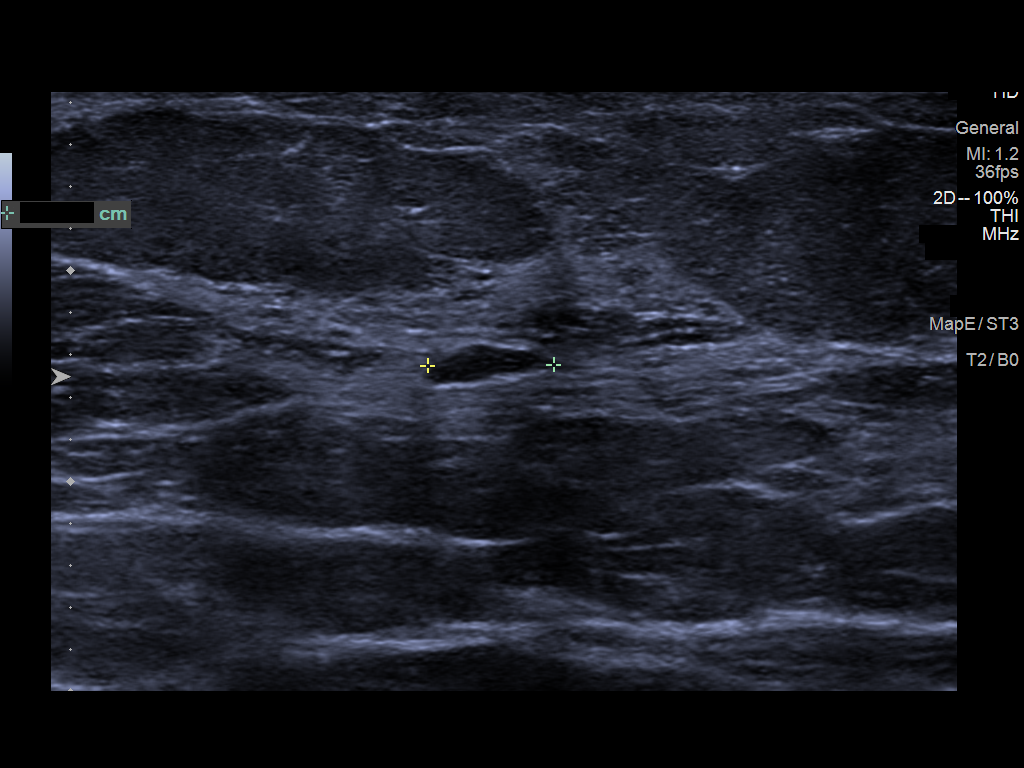
[im 6/6]
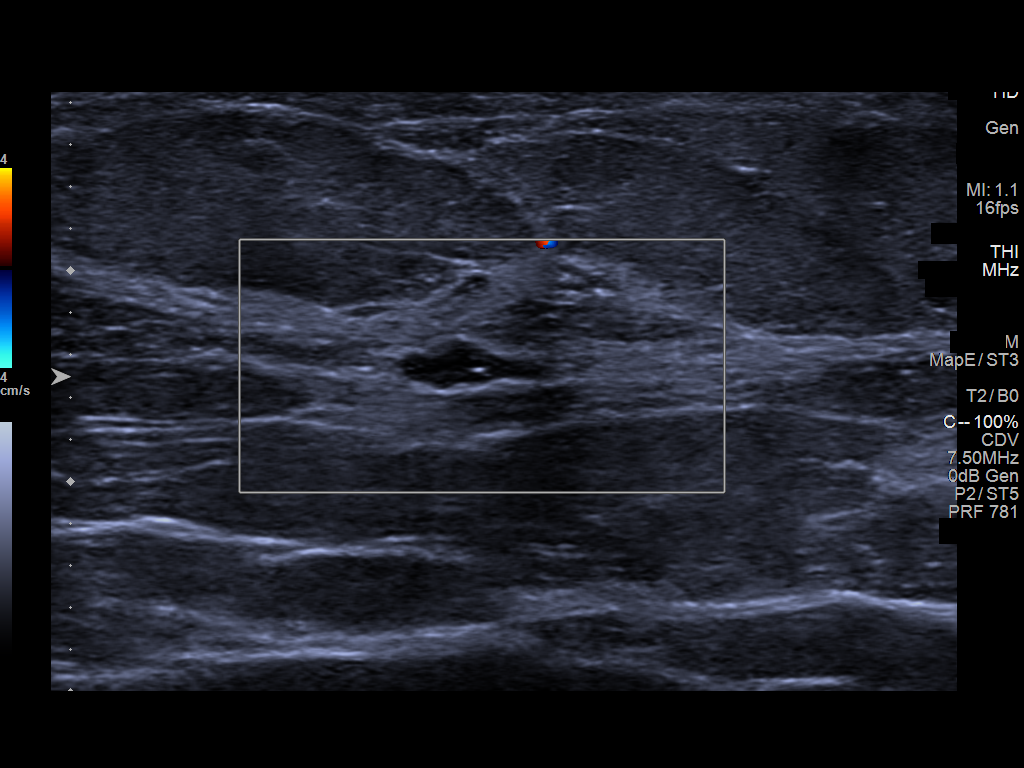

[6 of 6 positions shown; findings below may reference images not displayed]

ACR Breast Density Category c: The breast tissue is heterogeneously
dense, which may obscure small masses.
FINDINGS: 2D/3D full field views of both breasts demonstrate no suspicious
mass, distortion or worrisome calcifications.

Targeted ultrasound is performed, showing a stable 1 x 0.3 x 0.6 cm
benign complicated cyst at the 11 o'clock position of the RIGHT
breast 5 cm from the nipple. No further imaging follow-up
recommended.
IMPRESSION: 1. Stable benign cyst within the UPPER-OUTER RIGHT breast. No
further imaging follow-up recommended.
2. No mammographic evidence of breast malignancy.

RECOMMENDATION:
Bilateral screening mammogram in 1 year.

I have discussed the findings and recommendations with the patient.
If applicable, a reminder letter will be sent to the patient
regarding the next appointment.

BI-RADS CATEGORY  2: Benign.

## 2023-03-21 IMAGING — US US EXTREM UP*L* LTD
1 series · 12 of 12 positions shown · non-contrast
Comparison: X-ray 02/28/2013

CLINICAL DATA: Posterior left wrist swelling for 1 month

EXAM:
ULTRASOUND LEFT UPPER EXTREMITY LIMITED
TECHNIQUE: Ultrasound examination of the upper extremity soft tissues was
performed in the area of clinical concern.

[Series 1: us soft tissue upper extremity limited left (non-v · 12 of 12 slices shown]
[im 1/12]
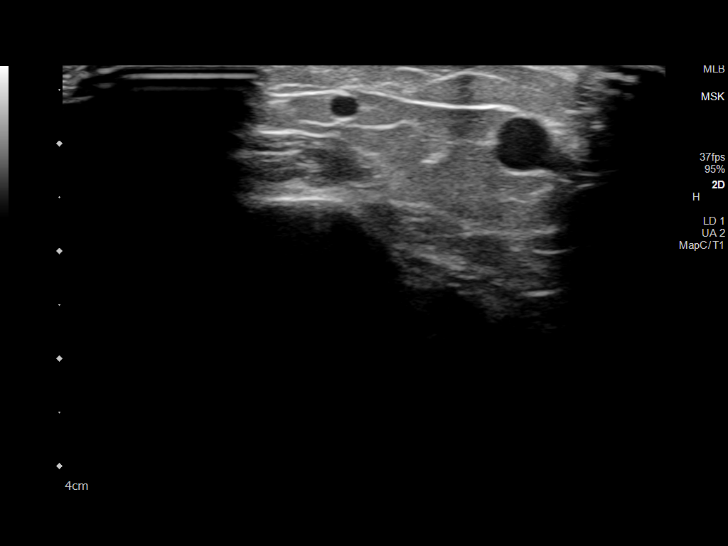
[im 2/12]
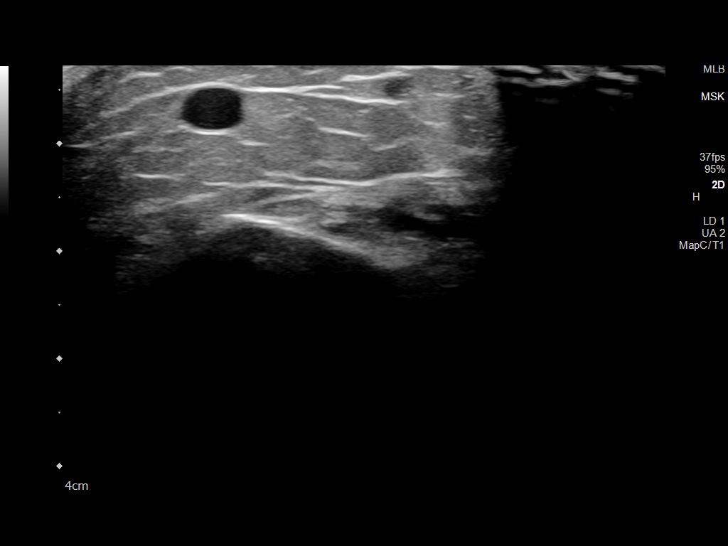
[im 3/12]
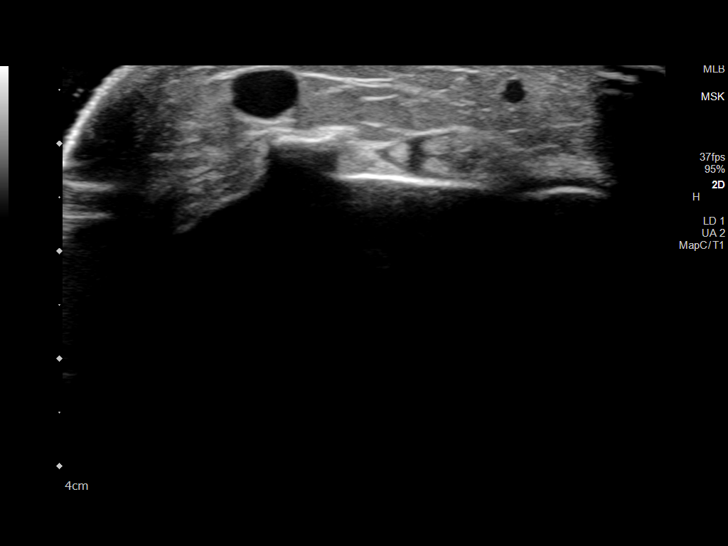
[im 4/12]
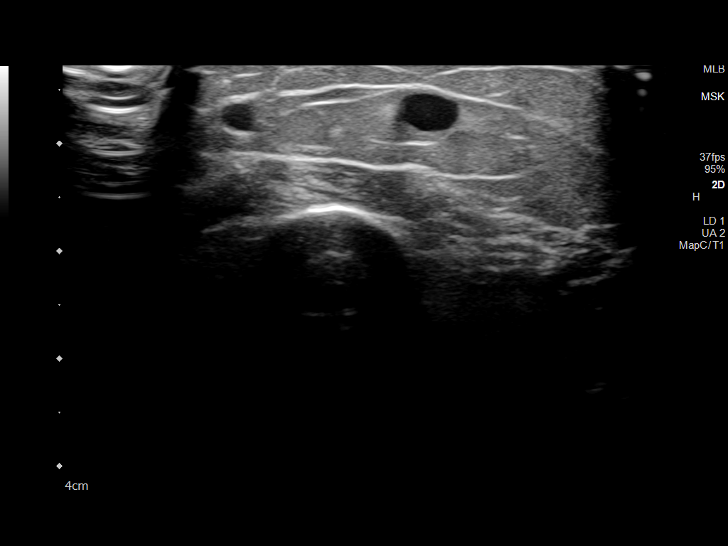
[im 5/12]
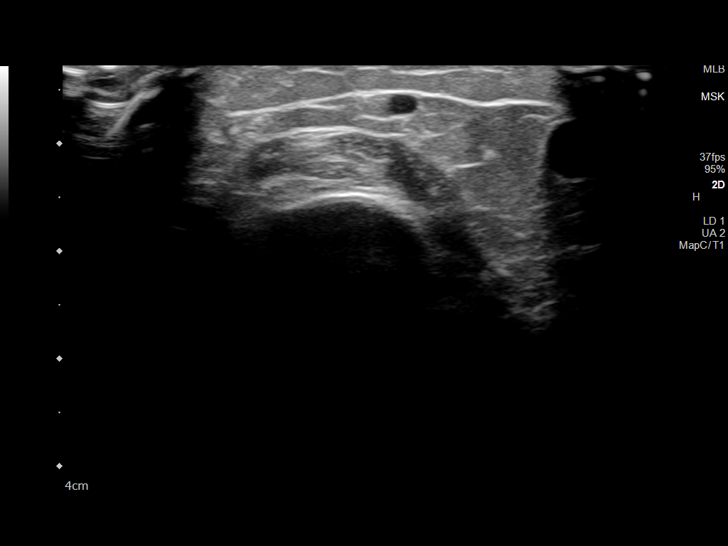
[im 6/12]
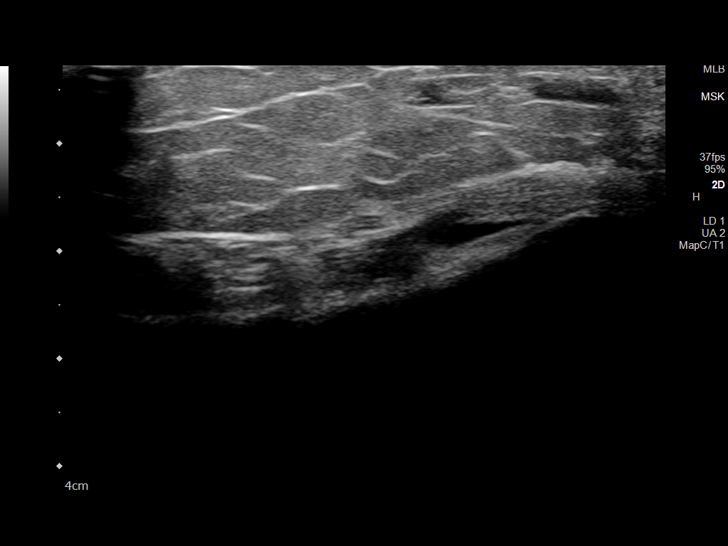
[im 7/12]
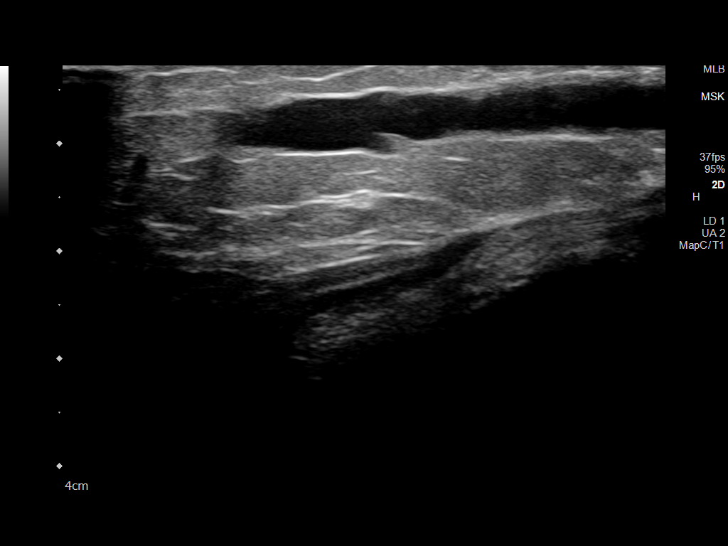
[im 8/12]
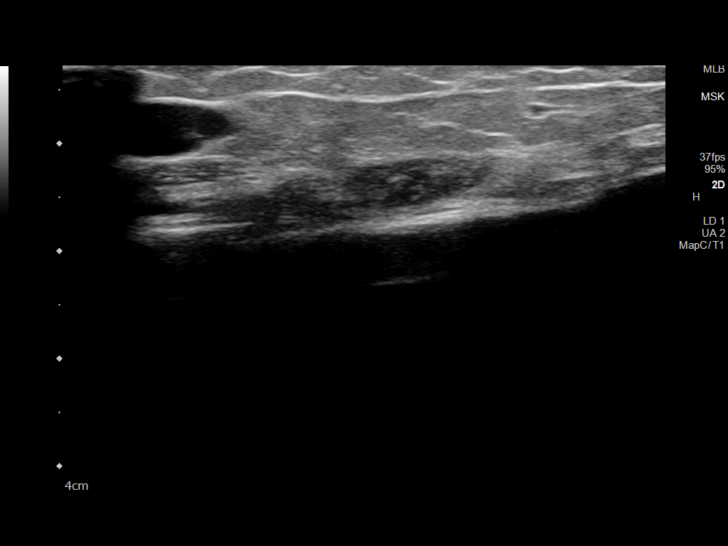
[im 9/12]
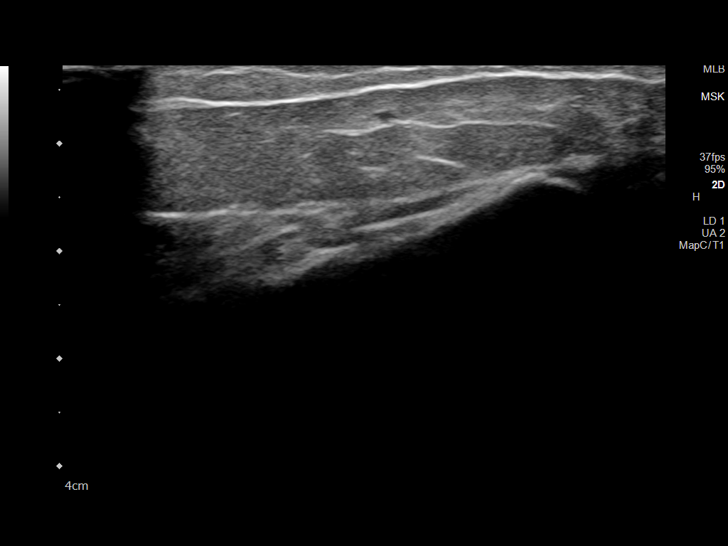
[im 10/12]
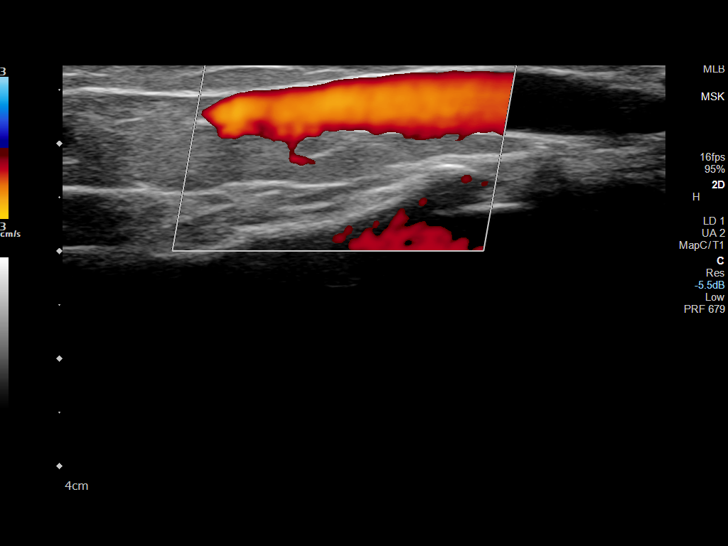
[im 11/12]
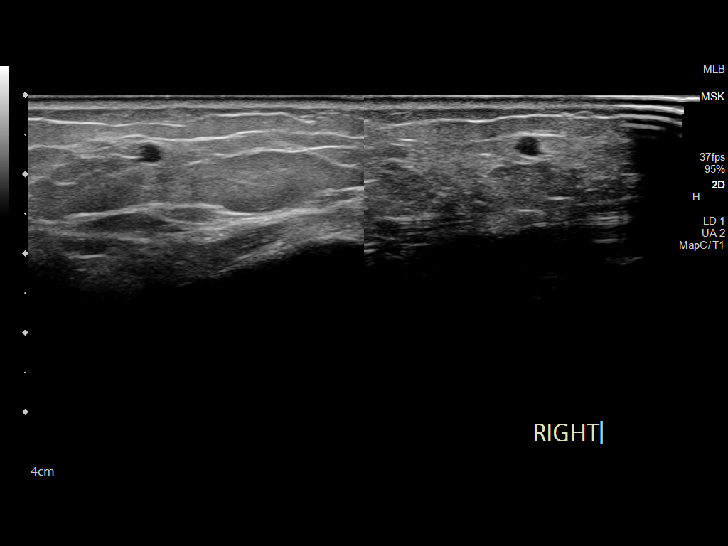
[im 12/12]
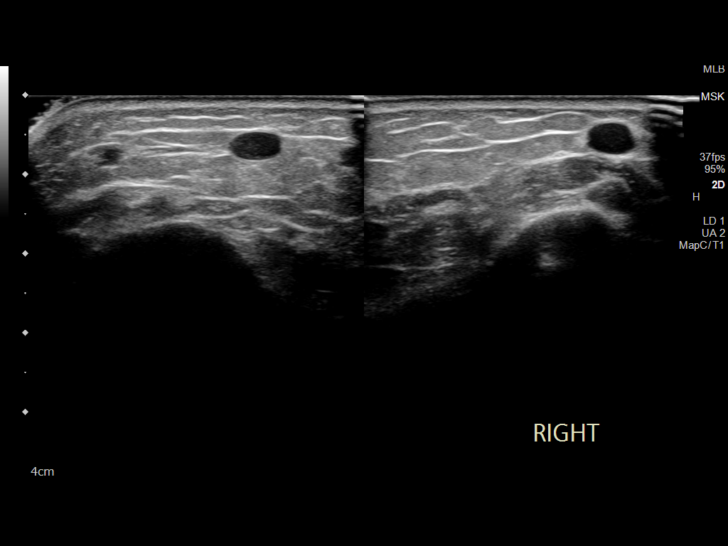

[12 of 12 positions shown; findings below may reference images not displayed]

FINDINGS: Targeted ultrasound was performed of the soft tissues of the dorsal
left wrist at site of patient's clinical concern. No soft tissue
edema fluid collection. Solid or cystic mass. Patent superficial
vascular structure was noted.
IMPRESSION: No sonographic abnormality at the posterior left wrist to correspond
to patient's clinical concern.

## 2023-04-08 ENCOUNTER — Ambulatory Visit
Admission: EM | Admit: 2023-04-08 | Discharge: 2023-04-08 | Disposition: A | Discharge status: Home / Self Care | Attending: Physician Assistant | Admitting: Physician Assistant

## 2023-04-08 ENCOUNTER — Encounter: Payer: Self-pay | Admitting: Emergency Medicine

## 2023-04-08 DIAGNOSIS — M545 Low back pain, unspecified: Secondary | ICD-10-CM | POA: Diagnosis present

## 2023-04-08 DIAGNOSIS — R1030 Lower abdominal pain, unspecified: Secondary | ICD-10-CM | POA: Insufficient documentation

## 2023-04-08 DIAGNOSIS — N3001 Acute cystitis with hematuria: Secondary | ICD-10-CM | POA: Insufficient documentation

## 2023-04-08 DIAGNOSIS — B3731 Acute candidiasis of vulva and vagina: Secondary | ICD-10-CM | POA: Diagnosis present

## 2023-04-08 LAB — URINALYSIS, W/ REFLEX TO CULTURE (INFECTION SUSPECTED)
Bilirubin Urine: NEGATIVE
Glucose, UA: NEGATIVE mg/dL
Ketones, ur: NEGATIVE mg/dL
Nitrite: NEGATIVE
Protein, ur: NEGATIVE mg/dL
Specific Gravity, Urine: 1.02 (ref 1.005–1.030)
pH: 5.5 (ref 5.0–8.0)

## 2023-04-08 MED ORDER — KETOROLAC TROMETHAMINE 60 MG/2ML IM SOLN
30.0000 mg | Freq: Once | INTRAMUSCULAR | Status: AC
  Administered 2023-04-08: 30 mg via INTRAMUSCULAR

## 2023-04-08 MED ORDER — FLUCONAZOLE 150 MG PO TABS: ORAL_TABLET | ORAL | 0 refills | Status: AC

## 2023-04-08 MED ORDER — SULFAMETHOXAZOLE-TRIMETHOPRIM 800-160 MG PO TABS: 1.0000 | ORAL_TABLET | Freq: Two times a day (BID) | ORAL | 0 refills | Status: AC

## 2023-04-08 NOTE — ED Provider Notes (Signed)
 [facility]    CSN: [id number] Arrival date & time: 04/08/23  1531      History   Chief Complaint Chief Complaint  Patient presents with   Back Pain    HPI Erin Mcdonald is a 48 y.o. female presenting for onset of fatigue, lower back and lower abdominal cramping today. Also admits to increased urinary frequency and urgency as well. Denies dysuria or hematuria. Denies any associated fever, body aches, vomiting. Denies any bowel changes-denies constipation or diarrhea.. No vaginal discharge or concern for STIs. Has taken Tylenol without relief. Patient has no other complaints or concerns.  HPI  Past Medical History:  Diagnosis Date   Anxiety    Depression    Genital herpes    Sleep apnea     Patient Active Problem List   Diagnosis Date Noted   LDL-c greater than or equal to 100 mg/dl 40/98/1191   Constipation 09/30/2021   Adenomatous polyp of sigmoid colon    Antibiotic-induced yeast infection 03/26/2021   Body mass index (BMI) 40.0-44.9, adult (HCC) 01/10/2021   Upper respiratory tract infection due to COVID-19 virus 01/10/2021   Pre-diabetes 08/31/2020   Acute recurrent pansinusitis 05/06/2020   Local superficial swelling, mass or lump 04/06/2020   Seasonal allergic rhinitis due to pollen 04/06/2020   Loud snoring 04/06/2020   Abnormal mammogram of right breast 01/22/2020   Vitamin B12 deficiency 02/26/2019   Urinary tract infection with hematuria 08/17/2018   Dysuria 08/17/2018   Other fatigue 09/10/2017   Vitamin D deficiency 09/10/2017   Impaired fasting glucose 09/10/2017   Abnormal weight gain 09/10/2017   Contusion of wrist 07/23/2017   OSA (obstructive sleep apnea) 07/23/2017   Family history of colon cancer in father 07/23/2017   Menorrhagia with regular cycle 07/23/2017   Atopic dermatitis 06/10/2017   Depression, major, single episode, moderate (HCC) 06/10/2017   Candidal vaginitis 02/21/2017   Major depressive disorder, recurrent, moderate  (HCC) 01/18/2017   GAD (generalized anxiety disorder) 01/18/2017   Gastroesophageal reflux disease 01/18/2017   Chronic venous insufficiency 06/14/2016   Bilateral lower extremity edema 04/12/2016   Varicose veins of bilateral lower extremities with pain 04/12/2016   Iron deficiency anemia 08/27/2014   Encounter for long-term (current) use of medications 08/15/2012   Herpes simplex disease 11/02/2008   OBESITY 04/14/2008   DEPRESSION 03/10/2008   Viral warts 02/04/2008   Anxiety state 10/31/2007   SNORING 03/27/2007   Acute upper respiratory infection 02/25/2007    Past Surgical History:  Procedure Laterality Date   COLONOSCOPY WITH PROPOFOL N/A 09/19/2021   Procedure: COLONOSCOPY WITH PROPOFOL;  Surgeon: Wyline Mood, MD;  Location: [facility] END[facility]COPY;  Service: Gastroenterology;  Laterality: N/A;   LAPAROSCOPIC GASTRIC SLEEVE RESECTION  11/15/2012   [facility], Dr Piedad Climes [place]   ROBOTIC ASSISTED LAPAROSCOPIC HYSTERECTOMY AND SALPINGECTOMY Bilateral 04/25/2021   Procedure: XI ROBOTIC ASSISTED LAPAROSCOPIC HYSTERECTOMY AND SALPINGECTOMY;  Surgeon: Hildred Laser, MD;  Location: [facility] ORS;  Service: Gynecology;  Laterality: Bilateral;   TONSILLECTOMY  2013    OB History     Gravida  0   Para  0   Term  0   Preterm  0   AB  0   Living  0      SAB  0   IAB  0   Ectopic  0   Multiple  0   Live Births  0            Home Medications    Prior to  Admission medications   Medication Sig Start Date End Date Taking? Authorizing Provider  fluconazole (DIFLUCAN) 150 MG tablet Take 1 tab po q72 hr 04/08/23  Yes Eusebio Friendly B, PA-C  sulfamethoxazole-trimethoprim (BACTRIM DS) 800-160 MG tablet Take 1 tablet by mouth 2 (two) times daily for 7 days. 04/08/23 04/15/23 Yes Shirlee Latch, PA-C  ALPRAZolam Prudy Feeler) 1 MG tablet Take 1 tablet (1 mg total) by mouth 3 (three) times daily as needed. 06/13/22   Carlean Jews, NP  azelastine (ASTELIN) 0.1 % nasal spray Place 1 spray into both  nostrils daily. 03/07/22   Carlean Jews, NP  Cholecalciferol (VITAMIN D3 PO) Take by mouth.    [provider]  cyanocobalamin (VITAMIN B12) 1000 MCG/ML injection Inject 1ml IM once weekly for 6 weeks then inject 1ml IM monthly 03/07/22   Boscia, Kathlynn Grate, NP  desloratadine (CLARINEX) 5 MG tablet Take 1 tablet (5 mg total) by mouth daily. 03/07/22   Carlean Jews, NP  docusate sodium (COLACE) 100 MG capsule Take 1 capsule (100 mg total) by mouth 2 (two) times daily as needed for mild constipation. 04/25/21   Hildred Laser, MD  escitalopram (LEXAPRO) 20 MG tablet Take 1.5 tablets po QD 06/13/22   Carlean Jews, NP  Ferrous Sulfate (IRON PO) Take by mouth.    [provider]  hydrOXYzine (VISTARIL) 25 MG capsule Take 1 capsule (25 mg total) by mouth every 6 (six) hours as needed. (Can take 1 to 2 capsules up to 4 times a day) 03/16/22   Saralyn Pilar A, PA  ibuprofen (ADVIL) 800 MG tablet Take 1 tablet (800 mg total) by mouth every 8 (eight) hours as needed. 04/25/21   Hildred Laser, MD  Insulin Pen Needle (PEN NEEDLES) 32G X 5 MM MISC To use daily with dosage of saxenda 04/27/21   Carlean Jews, NP  meclizine (ANTIVERT) 25 MG tablet Take 1 tablet (25 mg total) by mouth 3 (three) times daily as needed for dizziness. 03/18/21   Carlean Jews, NP  methocarbamol (ROBAXIN) 500 MG tablet Take 1 tablet (500 mg total) by mouth 2 (two) times daily. 05/12/22   Brimage, Seward Meth, DO  montelukast (SINGULAIR) 10 MG tablet Take 1 tablet (10 mg total) by mouth at bedtime. 06/13/22   Carlean Jews, NP  naproxen (NAPROSYN) 500 MG tablet Take 1 tablet (500 mg total) by mouth 2 (two) times daily with a meal. 05/12/22   Brimage, Vondra, DO  omeprazole (PRILOSEC) 20 MG capsule Take 1 capsule (20 mg total) by mouth 2 (two) times daily. 03/07/22   Carlean Jews, NP  pravastatin (PRAVACHOL) 10 MG tablet Take 1 tablet (10 mg total) by mouth daily. 03/07/22   Carlean Jews, NP   Semaglutide-Weight Management (WEGOVY) 0.25 MG/0.5ML SOAJ Inject 0.25 mg into the skin once a week. 07/03/22   Boscia, Heather E, NP  SYRINGE-NEEDLE, DISP, 3 ML (BD INTEGRA SYRINGE) 25G X 1" 3 ML MISC USE AS DIRECTED WITH B12 12/13/21   Carlean Jews, NP  traZODone (DESYREL) 50 MG tablet Take 1 tablet (50 mg total) by mouth at bedtime as needed for sleep. 03/07/22   Carlean Jews, NP  valACYclovir (VALTREX) 1000 MG tablet Take 1 tablet (1,000 mg total) by mouth daily. 06/13/22   Carlean Jews, NP    Family History Family History  Problem Relation Age of Onset   Heart attack Mother    Diabetes Paternal Grandmother    Diabetes Paternal  Grandfather    Colon cancer Father 72   Breast cancer Neg Hx    Ovarian cancer Neg Hx     Social History Social History   Tobacco Use   Smoking status: Some Days    Current packs/day: 1.00    Types: Cigarettes   Smokeless tobacco: Never   Tobacco comments:    0.5-1ppd 08/25/2020  Vaping Use   Vaping status: Never Used  Substance Use Topics   Alcohol use: Yes    Comment: occasionally   Drug use: No     Allergies   Codeine   Review of Systems Review of Systems  Constitutional:  Positive for fatigue. Negative for chills and fever.  Gastrointestinal:  Positive for abdominal pain. Negative for diarrhea, nausea and vomiting.  Genitourinary:  Positive for frequency and urgency. Negative for decreased urine volume, dysuria, flank pain, hematuria, pelvic pain, vaginal bleeding, vaginal discharge and vaginal pain.  Musculoskeletal:  Positive for back pain.  Skin:  Negative for rash.     Physical Exam Triage Vital Signs  No data found.  Updated Vital Signs BP 119/80 (BP Location: Left Arm)   Pulse 81   Temp 98.2 F (36.8 C) (Oral)   Resp 14   Ht 5\' 9"  (1.753 m)   Wt 285 lb 0.9 oz (129.3 kg)   LMP 04/04/2021 Comment: Negative 04/25/2021  SpO2 95%   BMI 42.10 kg/m       Physical Exam Vitals and nursing note reviewed.   Constitutional:      General: She is not in acute distress.    Appearance: Normal appearance. She is not ill-appearing or toxic-appearing.  HENT:     Head: Normocephalic and atraumatic.  Eyes:     General: No scleral icterus.       Right eye: No discharge.        Left eye: No discharge.     Conjunctiva/sclera: Conjunctivae normal.  Cardiovascular:     Rate and Rhythm: Normal rate and regular rhythm.     Heart sounds: Normal heart sounds.  Pulmonary:     Effort: Pulmonary effort is normal. No respiratory distress.     Breath sounds: Normal breath sounds.  Abdominal:     Palpations: Abdomen is soft.     Tenderness: There is no abdominal tenderness. There is left CVA tenderness (mild/moderate). There is no right CVA tenderness.  Musculoskeletal:     Cervical back: Neck supple.  Skin:    General: Skin is dry.  Neurological:     General: No focal deficit present.     Mental Status: She is alert. Mental status is at baseline.     Motor: No weakness.     Gait: Gait normal.  Psychiatric:        Mood and Affect: Mood normal.        Behavior: Behavior normal.      UC Treatments / Results  Labs (all labs ordered are listed, but only abnormal results are displayed) Labs Reviewed  URINALYSIS, W/ REFLEX TO CULTURE (INFECTION SUSPECTED) - Abnormal; Notable for the following components:      Result Value   APPearance HAZY (*)    Hgb urine dipstick MODERATE (*)    Leukocytes,Ua TRACE (*)    Bacteria, UA FEW (*)    All other components within normal limits  URINE CULTURE     EKG   Radiology No results found.  Procedures Procedures (including critical care time)  Medications Ordered in UC Medications  ketorolac (TORADOL)  injection 30 mg (30 mg Intramuscular Given 04/08/23 1549)     Initial Impression / Assessment and Plan / UC Course  I have reviewed the triage vital signs and the nursing notes.  Pertinent labs & imaging results that were available during my care of  the patient were reviewed by me and considered in my medical decision making (see chart for details).   48 year old female with 1 day history of lower back and abdominal pain.  Associated symptoms of urinary frequency and urgency as well as fatigue.  All vital signs are normal and stable and she is afebrile.  She is in no acute distress.  On exam, patient has no abdominal tenderness and no CVA tenderness. Appears uncomfortable with position changes.   Patient given 30 mg IM ketorolac for pain.  Urinalysis shows hazy appearance, leukocytes, blood and bacteria. Yeast also present.  Urine sent for culture.  Treating for suspected urinary tract infection with Bactrim DS. Patient took amoxicillin last month for sinusitis.  Also sent diflucan for yeast. Advised for care with increased rest and fluids and Tylenol for pain.   ED precautions reviewed.  Advised to go to ED for any worsening pain, fever or gross blood in urine.   Final Clinical Impressions(s) / UC Diagnoses   Final diagnoses:  Acute cystitis with hematuria  Acute bilateral low back pain without sciatica  Lower abdominal pain  Vaginal yeast infection     Discharge Instructions      UTI: Based on either symptoms or urinalysis, you may have a urinary tract infection. We will send the urine for culture and call with results in a few days. Begin antibiotics at this time. Your symptoms should be much improved over the next 2-3 days. Increase rest and fluid intake. If for some reason symptoms are worsening or not improving after a couple of days or the urine culture determines the antibiotics you are taking will not treat the infection, the antibiotics may be changed. Return or go to ER for fever, back pain, worsening urinary pain, discharge, increased blood in urine. May take Tylenol or Motrin OTC for pain relief or consider AZO if no contraindications      ED Prescriptions     Medication Sig Dispense Auth. Provider    sulfamethoxazole-trimethoprim (BACTRIM DS) 800-160 MG tablet Take 1 tablet by mouth 2 (two) times daily for 7 days. 14 tablet Eusebio Friendly B, PA-C   fluconazole (DIFLUCAN) 150 MG tablet Take 1 tab po q72 hr 2 tablet Shirlee Latch, PA-C      PDMP not reviewed this encounter.    Shirlee Latch, PA-C 04/08/23 1600

## 2023-04-08 NOTE — Discharge Instructions (Addendum)

## 2023-04-08 NOTE — ED Triage Notes (Signed)
 Patient c/o lower back pain that started last night.  Patient reports some urinary frequency.  Patient reports some lower abdominal cramps this morning.

## 2023-04-09 ENCOUNTER — Ambulatory Visit

## 2023-04-11 LAB — URINE CULTURE: Culture: >=100000 — AB

## 2023-04-12 ENCOUNTER — Ambulatory Visit
Admission: RE | Admit: 2023-04-12 | Discharge: 2023-04-12 | Disposition: A | Discharge status: Home / Self Care | Source: Ambulatory Visit | Attending: Physician Assistant | Admitting: Physician Assistant

## 2023-04-12 ENCOUNTER — Ambulatory Visit

## 2023-04-12 VITALS — BP 111/84 | HR 92 | Temp 98.3°F | Resp 16 | Ht 69.0 in | Wt 285.1 lb

## 2023-04-12 DIAGNOSIS — R319 Hematuria, unspecified: Secondary | ICD-10-CM

## 2023-04-12 DIAGNOSIS — N390 Urinary tract infection, site not specified: Secondary | ICD-10-CM

## 2023-04-12 DIAGNOSIS — M545 Low back pain, unspecified: Secondary | ICD-10-CM

## 2023-04-12 DIAGNOSIS — R5383 Other fatigue: Secondary | ICD-10-CM | POA: Diagnosis not present

## 2023-04-12 LAB — URINALYSIS, W/ REFLEX TO CULTURE (INFECTION SUSPECTED)
Bilirubin Urine: NEGATIVE
Glucose, UA: NEGATIVE mg/dL
Ketones, ur: NEGATIVE mg/dL
Leukocytes,Ua: NEGATIVE
Nitrite: NEGATIVE
Protein, ur: NEGATIVE mg/dL
Specific Gravity, Urine: >1.03 — ABNORMAL HIGH (ref 1.005–1.030)
pH: 5.5 (ref 5.0–8.0)

## 2023-04-12 MED ORDER — CYCLOBENZAPRINE HCL 10 MG PO TABS: 10.0000 mg | ORAL_TABLET | Freq: Three times a day (TID) | ORAL | 0 refills | Status: AC | PRN

## 2023-04-12 MED ORDER — KETOROLAC TROMETHAMINE 60 MG/2ML IM SOLN
30.0000 mg | Freq: Once | INTRAMUSCULAR | Status: AC
  Administered 2023-04-12: 30 mg via INTRAMUSCULAR

## 2023-04-12 MED ORDER — HYDROCODONE-ACETAMINOPHEN 5-325 MG PO TABS: 1.0000 | ORAL_TABLET | Freq: Four times a day (QID) | ORAL | 0 refills | Status: AC | PRN

## 2023-04-12 MED ORDER — CEPHALEXIN 500 MG PO CAPS: 500.0000 mg | ORAL_CAPSULE | Freq: Three times a day (TID) | ORAL | 0 refills | Status: AC

## 2023-04-12 MED ORDER — CEFTRIAXONE SODIUM 1 G IJ SOLR
1.0000 g | Freq: Once | INTRAMUSCULAR | Status: AC
  Administered 2023-04-12: 1 g via INTRAMUSCULAR

## 2023-04-12 NOTE — Discharge Instructions (Addendum)
-  You were given an injection called ketorolac for back pain.  Your back pain could be referred pain from bladder infection of your bladder infection looks like it is clearing up.  However, since you are not feeling significantly better in 1 to change her antibiotic.  You were given an antibiotic shot called ceftriaxone in clinic.  Start the Keflex antibiotic this evening. - Discontinue the Bactrim DS. -You must increase your fluids significantly.  You are a bit dehydrated. - I sent pain medicine for the next couple days. - If you are not feeling better in the next 2 days or symptoms worsen you should go to the ER.

## 2023-04-12 NOTE — ED Provider Notes (Signed)
 [facility]    CSN: [id number] Arrival date & time: 04/12/23  1142      History   Chief Complaint Chief Complaint  Patient presents with   Back Pain    Appt    HPI Erin Mcdonald is a 48 y.o. female presenting for continued low back pain.  Patient was seen here by myself 4 days ago and diagnosed with urinary tract infection.  She was placed on Bactrim DS.  She reports urinary symptoms have improved but her back pain has gotten worse.  Back pain is lower and not in the flank region.  No associated fever, nausea/vomiting, hematuria, vaginal discharge.  Denies frequency or painful urination at this time.  However, she denied painful urination when she was seen a few days ago as well and had a UTI.  Patient is worried about possibly having a kidney infection. Patient has no other complaints or concerns.  HPI  Past Medical History:  Diagnosis Date   Anxiety    Depression    Genital herpes    Sleep apnea     Patient Active Problem List   Diagnosis Date Noted   LDL-c greater than or equal to 100 mg/dl 78/29/5621   Constipation 09/30/2021   Adenomatous polyp of sigmoid colon    Antibiotic-induced yeast infection 03/26/2021   Body mass index (BMI) 40.0-44.9, adult (HCC) 01/10/2021   Upper respiratory tract infection due to COVID-19 virus 01/10/2021   Pre-diabetes 08/31/2020   Acute recurrent pansinusitis 05/06/2020   Local superficial swelling, mass or lump 04/06/2020   Seasonal allergic rhinitis due to pollen 04/06/2020   Loud snoring 04/06/2020   Abnormal mammogram of right breast 01/22/2020   Vitamin B12 deficiency 02/26/2019   Urinary tract infection with hematuria 08/17/2018   Dysuria 08/17/2018   Other fatigue 09/10/2017   Vitamin D deficiency 09/10/2017   Impaired fasting glucose 09/10/2017   Abnormal weight gain 09/10/2017   Contusion of wrist 07/23/2017   OSA (obstructive sleep apnea) 07/23/2017   Family history of colon cancer in father 07/23/2017    Menorrhagia with regular cycle 07/23/2017   Atopic dermatitis 06/10/2017   Depression, major, single episode, moderate (HCC) 06/10/2017   Candidal vaginitis 02/21/2017   Major depressive disorder, recurrent, moderate (HCC) 01/18/2017   GAD (generalized anxiety disorder) 01/18/2017   Gastroesophageal reflux disease 01/18/2017   Chronic venous insufficiency 06/14/2016   Bilateral lower extremity edema 04/12/2016   Varicose veins of bilateral lower extremities with pain 04/12/2016   Iron deficiency anemia 08/27/2014   Encounter for long-term (current) use of medications 08/15/2012   Herpes simplex disease 11/02/2008   OBESITY 04/14/2008   DEPRESSION 03/10/2008   Viral warts 02/04/2008   Anxiety state 10/31/2007   SNORING 03/27/2007   Acute upper respiratory infection 02/25/2007    Past Surgical History:  Procedure Laterality Date   COLONOSCOPY WITH PROPOFOL N/A 09/19/2021   Procedure: COLONOSCOPY WITH PROPOFOL;  Surgeon: Wyline Mood, MD;  Location: [facility] ENDOSCOPY;  Service: Gastroenterology;  Laterality: N/A;   LAPAROSCOPIC GASTRIC SLEEVE RESECTION  11/15/2012   [facility], Dr Piedad Climes [place]   ROBOTIC ASSISTED LAPAROSCOPIC HYSTERECTOMY AND SALPINGECTOMY Bilateral 04/25/2021   Procedure: XI ROBOTIC ASSISTED LAPAROSCOPIC HYSTERECTOMY AND SALPINGECTOMY;  Surgeon: Hildred Laser, MD;  Location: [facility] ORS;  Service: Gynecology;  Laterality: Bilateral;   TONSILLECTOMY  2013    OB History     Gravida  0   Para  0   Term  0   Preterm  0   AB  0   Living  0      SAB  0   IAB  0   Ectopic  0   Multiple  0   Live Births  0            Home Medications    Prior to Admission medications   Medication Sig Start Date End Date Taking? Authorizing Provider  ALPRAZolam Prudy Feeler) 1 MG tablet Take 1 tablet (1 mg total) by mouth 3 (three) times daily as needed. 06/13/22  Yes Boscia, Heather E, NP  atorvastatin (LIPITOR) 20 MG tablet Take 1 tablet by mouth daily. 11/08/22 11/08/23 Yes  [provider]  azelastine (ASTELIN) 0.1 % nasal spray Place 1 spray into both nostrils daily. 03/07/22  Yes Boscia, Kathlynn Grate, NP  cephALEXin (KEFLEX) 500 MG capsule Take 1 capsule (500 mg total) by mouth 3 (three) times daily for 7 days. 04/12/23 04/19/23 Yes Shirlee Latch, PA-C  Cholecalciferol (VITAMIN D3 PO) Take by mouth.   Yes [provider]  cyanocobalamin (VITAMIN B12) 1000 MCG/ML injection Inject 1ml IM once weekly for 6 weeks then inject 1ml IM monthly 03/07/22  Yes Boscia, Heather E, NP  cyclobenzaprine (FLEXERIL) 10 MG tablet Take 1 tablet (10 mg total) by mouth 3 (three) times daily as needed. 04/12/23  Yes Shirlee Latch, PA-C  desloratadine (CLARINEX) 5 MG tablet Take 1 tablet (5 mg total) by mouth daily. 03/07/22  Yes Boscia, Kathlynn Grate, NP  docusate sodium (COLACE) 100 MG capsule Take 1 capsule (100 mg total) by mouth 2 (two) times daily as needed for mild constipation. 04/25/21  Yes Hildred Laser, MD  escitalopram (LEXAPRO) 20 MG tablet Take 1.5 tablets po QD 06/13/22  Yes Boscia, Heather E, NP  Ferrous Sulfate (IRON PO) Take by mouth.   Yes [provider]  fluconazole (DIFLUCAN) 150 MG tablet Take 1 tab po q72 hr 04/08/23  Yes Shirlee Latch, PA-C  HYDROcodone-acetaminophen (NORCO/VICODIN) 5-325 MG tablet Take 1 tablet by mouth every 6 (six) hours as needed for up to 2 days for moderate pain (pain score 4-6) or severe pain (pain score 7-10). 04/12/23 04/14/23 Yes Shirlee Latch, PA-C  hydrOXYzine (VISTARIL) 25 MG capsule Take 1 capsule (25 mg total) by mouth every 6 (six) hours as needed. (Can take 1 to 2 capsules up to 4 times a day) 03/16/22  Yes Saralyn Pilar A, PA  ibuprofen (ADVIL) 800 MG tablet Take 1 tablet (800 mg total) by mouth every 8 (eight) hours as needed. 04/25/21  Yes Hildred Laser, MD  Insulin Pen Needle (PEN NEEDLES) 32G X 5 MM MISC To use daily with dosage of saxenda 04/27/21  Yes Boscia, Kathlynn Grate, NP  lamoTRIgine (LAMICTAL) 150 MG tablet Take  150 mg by mouth daily. 03/16/23  Yes [provider]  meclizine (ANTIVERT) 25 MG tablet Take 1 tablet (25 mg total) by mouth 3 (three) times daily as needed for dizziness. 03/18/21  Yes Boscia, Kathlynn Grate, NP  methocarbamol (ROBAXIN) 500 MG tablet Take 1 tablet (500 mg total) by mouth 2 (two) times daily. 05/12/22  Yes Brimage, Vondra, DO  montelukast (SINGULAIR) 10 MG tablet Take 1 tablet (10 mg total) by mouth at bedtime. 06/13/22  Yes Carlean Jews, NP  naproxen (NAPROSYN) 500 MG tablet Take 1 tablet (500 mg total) by mouth 2 (two) times daily with a meal. 05/12/22  Yes Brimage, Vondra, DO  omeprazole (PRILOSEC) 20 MG capsule Take 1 capsule (20 mg total) by mouth 2 (  two) times daily. 03/07/22  Yes Boscia, Kathlynn Grate, NP  pravastatin (PRAVACHOL) 10 MG tablet Take 1 tablet (10 mg total) by mouth daily. 03/07/22  Yes Carlean Jews, NP  Semaglutide-Weight Management (WEGOVY) 0.25 MG/0.5ML SOAJ Inject 0.25 mg into the skin once a week. 07/03/22  Yes Boscia, Kathlynn Grate, NP  sulfamethoxazole-trimethoprim (BACTRIM DS) 800-160 MG tablet Take 1 tablet by mouth 2 (two) times daily for 7 days. 04/08/23 04/15/23 Yes Verleen Stuckey, Cathlean Cower B, PA-C  SYRINGE-NEEDLE, DISP, 3 ML (BD INTEGRA SYRINGE) 25G X 1" 3 ML MISC USE AS DIRECTED WITH B12 12/13/21  Yes Boscia, Heather E, NP  traZODone (DESYREL) 50 MG tablet Take 1 tablet (50 mg total) by mouth at bedtime as needed for sleep. 03/07/22  Yes Boscia, Kathlynn Grate, NP  valACYclovir (VALTREX) 1000 MG tablet Take 1 tablet (1,000 mg total) by mouth daily. 06/13/22  Yes Carlean Jews, NP    Family History Family History  Problem Relation Age of Onset   Heart attack Mother    Diabetes Paternal Grandmother    Diabetes Paternal Grandfather    Colon cancer Father 34   Breast cancer Neg Hx    Ovarian cancer Neg Hx     Social History Social History   Tobacco Use   Smoking status: Some Days    Current packs/day: 1.00    Types: Cigarettes   Smokeless tobacco: Never    Tobacco comments:    0.5-1ppd 08/25/2020  Vaping Use   Vaping status: Never Used  Substance Use Topics   Alcohol use: Yes    Comment: occasionally   Drug use: No     Allergies   Codeine   Review of Systems Review of Systems  Constitutional:  Positive for fatigue. Negative for chills and fever.  Gastrointestinal:  Negative for abdominal pain, diarrhea, nausea and vomiting.  Genitourinary:  Negative for decreased urine volume, dysuria, flank pain, frequency, hematuria, pelvic pain, urgency, vaginal bleeding, vaginal discharge and vaginal pain.  Musculoskeletal:  Positive for back pain.  Skin:  Negative for rash.     Physical Exam Triage Vital Signs  No data found.  Updated Vital Signs BP 111/84 (BP Location: Left Arm)   Pulse 92   Temp 98.3 F (36.8 C) (Oral)   Resp 16   Ht 5\' 9"  (1.753 m)   Wt 285 lb 0.9 oz (129.3 kg)   LMP 04/04/2021 Comment: Negative 04/25/2021  SpO2 97%   BMI 42.10 kg/m       Physical Exam Vitals and nursing note reviewed.  Constitutional:      General: She is not in acute distress.    Appearance: Normal appearance. She is not ill-appearing or toxic-appearing.  HENT:     Head: Normocephalic and atraumatic.  Eyes:     General: No scleral icterus.       Right eye: No discharge.        Left eye: No discharge.     Conjunctiva/sclera: Conjunctivae normal.  Cardiovascular:     Rate and Rhythm: Normal rate and regular rhythm.     Heart sounds: Normal heart sounds.  Pulmonary:     Effort: Pulmonary effort is normal. No respiratory distress.     Breath sounds: Normal breath sounds.  Abdominal:     Palpations: Abdomen is soft.     Tenderness: There is no abdominal tenderness. There is no right CVA tenderness or left CVA tenderness.  Musculoskeletal:     Cervical back: Neck supple.     Comments:  Tenderness of the central lower back.  No CVA tenderness.  Skin:    General: Skin is dry.  Neurological:     General: No focal deficit present.      Mental Status: She is alert. Mental status is at baseline.     Motor: No weakness.     Gait: Gait normal.  Psychiatric:        Mood and Affect: Mood normal.        Behavior: Behavior normal.      UC Treatments / Results  Labs (all labs ordered are listed, but only abnormal results are displayed) Labs Reviewed  URINALYSIS, W/ REFLEX TO CULTURE (INFECTION SUSPECTED) - Abnormal; Notable for the following components:      Result Value   Specific Gravity, Urine >1.030 (*)    Hgb urine dipstick MODERATE (*)    Bacteria, UA MANY (*)    All other components within normal limits  URINE CULTURE     EKG   Radiology No results found.  Procedures Procedures (including critical care time)  Medications Ordered in UC Medications  ketorolac (TORADOL) injection 30 mg (30 mg Intramuscular Given 04/12/23 1245)  cefTRIAXone (ROCEPHIN) injection 1 g (1 g Intramuscular Given 04/12/23 1245)     Initial Impression / Assessment and Plan / UC Course  I have reviewed the triage vital signs and the nursing notes.  Pertinent labs & imaging results that were available during my care of the patient were reviewed by me and considered in my medical decision making (see chart for details).   48 year old female with 5 day history of lower back.  Patient seen here 4 days ago and diagnosed with UTI.  At that time she had associated symptoms of urinary frequency and urgency as well as fatigue.  Patient reports improvement in all symptoms since starting Bactrim DS except for the lower back pain which is gotten worse.  No associated fever.  All vital signs are normal and stable and she is afebrile.  She is in no acute distress.  Patient does appear uncomfortable and in pain, holding her back.  On exam, patient has no abdominal tenderness and no CVA tenderness.  Has central lower back tenderness.  Patient given 30 mg IM ketorolac for pain.  Urinalysis shows greater than 1.030 specific gravity, moderate  hemoglobin and many bacteria.   Urine sent for culture.  I reviewed previous culture which did show sensitivity to Bactrim DS but will change antibiotic in case UTI is ascending.  Explained the lower back pain could be radiation of pain from the bladder or possible musculoskeletal back pain which may be unrelated.  Patient given 1 g IM ceftriaxone in clinic.  Sent Keflex to pharmacy.  Patient was also given 2 days of Norco for severe pain.  Explained if her symptoms or not getting better in the next 2 days or they worsen she will need to go to the emergency department.  Additionally sent cyclobenzaprine for lower back spasms.  Advised for care with increased rest and fluids.   ED precautions reviewed.  Advised to go to ED for any worsening pain, fever or gross blood in urine.  Acute illness with systemic symptoms.  Concern for ascending UTI.  Final Clinical Impressions(s) / UC Diagnoses   Final diagnoses:  Urinary tract infection with hematuria, site unspecified  Acute midline low back pain without sciatica  Other fatigue     Discharge Instructions      -You were given an injection called ketorolac  for back pain.  Your back pain could be referred pain from bladder infection of your bladder infection looks like it is clearing up.  However, since you are not feeling significantly better in 1 to change her antibiotic.  You were given an antibiotic shot called ceftriaxone in clinic.  Start the Keflex antibiotic this evening. - Discontinue the Bactrim DS. -You must increase your fluids significantly.  You are a bit dehydrated. - I sent pain medicine for the next couple days. - If you are not feeling better in the next 2 days or symptoms worsen you should go to the ER.     ED Prescriptions     Medication Sig Dispense Auth. Provider   cephALEXin (KEFLEX) 500 MG capsule Take 1 capsule (500 mg total) by mouth 3 (three) times daily for 7 days. 21 capsule Eusebio Friendly B, PA-C    HYDROcodone-acetaminophen (NORCO/VICODIN) 5-325 MG tablet Take 1 tablet by mouth every 6 (six) hours as needed for up to 2 days for moderate pain (pain score 4-6) or severe pain (pain score 7-10). 8 tablet Eusebio Friendly B, PA-C   cyclobenzaprine (FLEXERIL) 10 MG tablet Take 1 tablet (10 mg total) by mouth 3 (three) times daily as needed. 20 tablet Shirlee Latch, PA-C      I have reviewed the PDMP during this encounter.    Shirlee Latch, PA-C 04/08/23 1600    Eusebio Friendly B, [place] 04/12/23 1314

## 2023-04-12 NOTE — ED Triage Notes (Signed)
 Pt still c/o lower back pain x5 days. States came Sunday was diagnosed with urinary track infection. Been taking antibiotics as prescribed. It's not gotten any better. It's actually gotten extremely worse. My primary can't get me in today.

## 2023-04-13 LAB — URINE CULTURE: Culture: <10000 — AB

## 2023-05-29 ENCOUNTER — Encounter (INDEPENDENT_AMBULATORY_CARE_PROVIDER_SITE_OTHER): Payer: Self-pay

## 2023-10-09 ENCOUNTER — Other Ambulatory Visit: Payer: Self-pay | Admitting: Internal Medicine

## 2023-10-09 DIAGNOSIS — Z1231 Encounter for screening mammogram for malignant neoplasm of breast: Secondary | ICD-10-CM

## 2023-11-07 ENCOUNTER — Ambulatory Visit
Admission: RE | Admit: 2023-11-07 | Discharge: 2023-11-07 | Disposition: A | Discharge status: Home / Self Care | Source: Ambulatory Visit | Attending: Internal Medicine | Admitting: Internal Medicine

## 2023-11-07 DIAGNOSIS — Z1231 Encounter for screening mammogram for malignant neoplasm of breast: Secondary | ICD-10-CM | POA: Diagnosis present

## 2024-01-23 ENCOUNTER — Ambulatory Visit
